# Patient Record
Sex: Male | Born: 1955 | Race: White | Hispanic: No | Marital: Married | Smoking: Never smoker
Health system: Southern US, Community
[De-identification: ages and names within clinical notes are randomized; demographics above are authoritative.]

## PROBLEM LIST (undated history)

## (undated) DIAGNOSIS — D649 Anemia, unspecified: Secondary | ICD-10-CM

## (undated) DIAGNOSIS — T7840XA Allergy, unspecified, initial encounter: Secondary | ICD-10-CM

## (undated) DIAGNOSIS — K922 Gastrointestinal hemorrhage, unspecified: Secondary | ICD-10-CM

## (undated) DIAGNOSIS — K635 Polyp of colon: Secondary | ICD-10-CM

## (undated) DIAGNOSIS — Z5189 Encounter for other specified aftercare: Secondary | ICD-10-CM

## (undated) DIAGNOSIS — E785 Hyperlipidemia, unspecified: Secondary | ICD-10-CM

## (undated) DIAGNOSIS — K5730 Diverticulosis of large intestine without perforation or abscess without bleeding: Secondary | ICD-10-CM

## (undated) DIAGNOSIS — R740 Nonspecific elevation of levels of transaminase and lactic acid dehydrogenase [LDH]: Secondary | ICD-10-CM

## (undated) DIAGNOSIS — Z87442 Personal history of urinary calculi: Secondary | ICD-10-CM

## (undated) DIAGNOSIS — R7401 Elevation of levels of liver transaminase levels: Secondary | ICD-10-CM

## (undated) DIAGNOSIS — K219 Gastro-esophageal reflux disease without esophagitis: Secondary | ICD-10-CM

## (undated) DIAGNOSIS — C4490 Unspecified malignant neoplasm of skin, unspecified: Secondary | ICD-10-CM

## (undated) HISTORY — DX: Encounter for other specified aftercare: Z51.89

## (undated) HISTORY — DX: Polyp of colon: K63.5

## (undated) HISTORY — DX: Personal history of urinary calculi: Z87.442

## (undated) HISTORY — DX: Unspecified malignant neoplasm of skin, unspecified: C44.90

## (undated) HISTORY — PX: APPENDECTOMY: SHX54

## (undated) HISTORY — PX: ELBOW ARTHROSCOPY: SUR87

## (undated) HISTORY — DX: Gastro-esophageal reflux disease without esophagitis: K21.9

## (undated) HISTORY — DX: Diverticulosis of large intestine without perforation or abscess without bleeding: K57.30

## (undated) HISTORY — PX: BACK SURGERY: SHX140

## (undated) HISTORY — DX: Elevation of levels of liver transaminase levels: R74.01

## (undated) HISTORY — DX: Anemia, unspecified: D64.9

## (undated) HISTORY — DX: Allergy, unspecified, initial encounter: T78.40XA

## (undated) HISTORY — DX: Nonspecific elevation of levels of transaminase and lactic acid dehydrogenase (ldh): R74.0

## (undated) HISTORY — PX: ASPIRATION BIOPSY: SHX1197

## (undated) HISTORY — DX: Hyperlipidemia, unspecified: E78.5

## (undated) HISTORY — DX: Gastrointestinal hemorrhage, unspecified: K92.2

---

## 2007-11-23 ENCOUNTER — Ambulatory Visit: Payer: Self-pay | Admitting: Gastroenterology

## 2007-11-30 ENCOUNTER — Ambulatory Visit: Payer: Self-pay | Admitting: Gastroenterology

## 2007-11-30 ENCOUNTER — Encounter: Payer: Self-pay | Admitting: Gastroenterology

## 2007-12-01 ENCOUNTER — Encounter: Payer: Self-pay | Admitting: Gastroenterology

## 2007-12-09 ENCOUNTER — Encounter: Payer: Self-pay | Admitting: Gastroenterology

## 2009-05-04 ENCOUNTER — Ambulatory Visit (HOSPITAL_BASED_OUTPATIENT_CLINIC_OR_DEPARTMENT_OTHER): Admission: RE | Admit: 2009-05-04 | Discharge: 2009-05-04 | Payer: Self-pay | Admitting: Orthopedic Surgery

## 2009-06-06 ENCOUNTER — Ambulatory Visit (HOSPITAL_BASED_OUTPATIENT_CLINIC_OR_DEPARTMENT_OTHER): Admission: RE | Admit: 2009-06-06 | Discharge: 2009-06-06 | Payer: Self-pay | Admitting: Internal Medicine

## 2009-06-10 ENCOUNTER — Ambulatory Visit: Payer: Self-pay | Admitting: Internal Medicine

## 2010-09-10 ENCOUNTER — Encounter (INDEPENDENT_AMBULATORY_CARE_PROVIDER_SITE_OTHER): Payer: Self-pay | Admitting: *Deleted

## 2010-09-20 NOTE — Letter (Signed)
Summary: New Patient letter  [facility]  [address] [place], [place] [address]   Phone: [phone]  Fax: [phone]       09/10/2010 MRN: [id number]  Kent Montgomery [address] [place], [place]  [address]  Dear Mr. Hulgan,  Welcome to the Gastroenterology Division at [facility].    You are scheduled to see Dr.  Jarold Motto on 10-19-10 at 8:30A.M. on the 3rd floor at [facility], [address].  We ask that you try to arrive at our office 15 minutes prior to your appointment time to allow for check-in.  We would like you to complete the enclosed self-administered evaluation form prior to your visit and bring it with you on the day of your appointment.  We will review it with you.  Also, please bring a complete list of all your medications or, if you prefer, bring the medication bottles and we will list them.  Please bring your insurance card so that we may make a copy of it.  If your insurance requires a referral to see a specialist, please bring your referral form from your primary care physician.  Co-payments are due at the time of your visit and may be paid by cash, check or credit card.     Your office visit will consist of a consult with your physician (includes a physical exam), any laboratory testing he/she may order, scheduling of any necessary diagnostic testing (e.g. x-ray, ultrasound, CT-scan), and scheduling of a procedure (e.g. Endoscopy, Colonoscopy) if required.  Please allow enough time on your schedule to allow for any/all of these possibilities.    If you cannot keep your appointment, please call [phone] to cancel or reschedule prior to your appointment date.  This allows [place] the opportunity to schedule an appointment for another patient in need of care.  If you do not cancel or reschedule by 5 p.m. the business day prior to your appointment date, you will be charged a $50.00 late cancellation/no-show fee.    Thank you for choosing  [facility] for your medical needs.  We appreciate the opportunity to care for you.  Please visit [place] at our website  to learn more about our practice.                     Sincerely,                                                             The Gastroenterology Division

## 2010-10-19 ENCOUNTER — Ambulatory Visit: Payer: Self-pay | Admitting: Gastroenterology

## 2010-10-24 ENCOUNTER — Ambulatory Visit: Payer: Self-pay | Admitting: Gastroenterology

## 2010-10-26 ENCOUNTER — Ambulatory Visit (INDEPENDENT_AMBULATORY_CARE_PROVIDER_SITE_OTHER): Payer: Commercial Managed Care - PPO | Admitting: Gastroenterology

## 2010-10-26 ENCOUNTER — Encounter: Payer: Self-pay | Admitting: Gastroenterology

## 2010-10-26 ENCOUNTER — Ambulatory Visit: Payer: Self-pay | Admitting: Physician Assistant

## 2010-10-26 VITALS — BP 100/78 | HR 64 | Ht 69.0 in | Wt 199.4 lb

## 2010-10-26 NOTE — Progress Notes (Signed)
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 .    He is referred today by Dr. Wylene Simmer for evaluation of chronic liver enzyme elevation over the last year of unexplained etiology. He was on statin medications which have been discontinued, he has used alcohol in moderation which also has been discontinued, still has mild elevation of AST and ALT. Review of his records reveal when he was a previous patient of Dr. Sharrell Ku [phone] with multiple hepatic evaluations and ultrasounds which suggested mild fatty infiltration of the liver. Then hir metabolic workup and hepatitis serologies were all negative as were other metabolic causes of liver disease and genetic causes of liver disease. The patient is fit, exercises regularly, but denies use of protein supplements or androgen compounds. . He specifically denies chronic fatigue, itching, mental status changes, ascites, or peripheral edema. Does suffer from hypercholesterolemia, and currently is not on statin medications which cause rather severe arthralgias and myalgias. Review of his labs shows normal CBC, serum albumin, PSA, HIV titer, and hepatitis serologies.  Previous colonoscopy on this patient resulted in  removal of adenomatous colon polyps. Due for followup exam in 2014. He denies abdominal pain, hepatobiliary symptoms, acid reflux or dyspepsia. He does not abuse aspirin or Tylenol,but does take when necessary Aleve. The patient underwent appendectomy in 1994 for perforated appendix. There is no history of tattoos, intravenous drug use, etc. Review of his chart does suggest that the patient's parents also suffered from elevated liver function tests of unexplained etiology. The patient does describe occasional right lower quadrant discomfort with gas and bloating but this is very transient and of little concern to the patient. He denies true systemic complaints or any specific  hepatobiliary complaints. His appetite is good and his weight is stable. He denies any specific food intolerances. Has been some recent mild elevation of serum creatinine levels. ROS: The remainder of the 10 point ROS is negative,,, recurrent cardiopulmonary, genitourinary, neurologic, orthopedic, or neuropsychiatric difficulties.  Past Medical History  Diagnosis Date  . Skin cancer     basil cell rt arm  . Colon polyp   . GERD (gastroesophageal reflux disease)   . Hyperlipidemia    Past Surgical History  Procedure Date  . Appendectomy   . Back surgery   . Elbow arthroscopy     reports that he has never smoked. He has never used smokeless tobacco. He reports that he drinks alcohol. He reports that he does not use illicit drugs. family history includes Cancer in his maternal grandfather; Colon polyps in his father; Heart disease in his father; Nephrolithiasis in his father; and Prostate cancer in his father. Allergies  Allergen Reactions  . Droperidol Anaphylaxis  . Ceftin Rash        Physical Exam: General well developed well nourished patient in no acute distress, appearing his stated age. Eyes PERRLA, no icterus, fundoscopic exam per opthamologist Skin no lesions noted Neck supple, no adenopathy, no thyroid enlargement, no tenderness Chest clear to percussion and auscultation Heart no significant murmurs, gallops or rubs noted Abdomen no hepatosplenomegaly masses or tenderness, BS normal.  Extremities no acute joint lesions, edema, phlebitis or evidence of cellulitis. Neurologic patient oriented x 3, cranial nerves intact, no focal neurologic deficits noted. Psychological mental status normal and normal affect.  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 There is certainly no evidence of chronic liver disease on physical exam . However, there has  been elevation of his serum transaminases for over 15 years, and I feel strongly that liver  biopsy is indicated at this time. We'll await further lab results from Dr. Wylene Simmer, but in the interim I have scheduled liver biopsy at [facility] ith radiographic guidance. He is to hold NSAIDs and salicylates at least 5 days before this procedure. Colonoscopy followup as per clinical protocol.  Please copy her primary care physician, referring physician, and pertinent subspecialists.  Encounter Diagnosis  Name Primary?  . Nonspecific elevation of levels of transaminase or lactic acid dehydrogenase (LDH) Yes

## 2010-10-26 NOTE — Patient Instructions (Addendum)
I have sent over an order for your Liver BX and someone from Radiology will contact you about setting up a date and time for your appt.  Your Colonoscopy is due 2014. Patients Liver bx is scheduled for 11/06/2010

## 2010-10-31 ENCOUNTER — Encounter: Payer: Self-pay | Admitting: Gastroenterology

## 2010-11-06 ENCOUNTER — Other Ambulatory Visit (HOSPITAL_COMMUNITY): Payer: Commercial Managed Care - PPO

## 2010-11-09 ENCOUNTER — Other Ambulatory Visit (HOSPITAL_COMMUNITY): Payer: Commercial Managed Care - PPO

## 2010-11-14 ENCOUNTER — Other Ambulatory Visit: Payer: Self-pay | Admitting: Gastroenterology

## 2010-11-14 ENCOUNTER — Ambulatory Visit (HOSPITAL_COMMUNITY)
Admission: RE | Admit: 2010-11-14 | Discharge: 2010-11-14 | Disposition: A | Discharge status: Home / Self Care | Payer: 59 | Source: Ambulatory Visit | Attending: Physician Assistant | Admitting: Physician Assistant

## 2010-11-14 ENCOUNTER — Other Ambulatory Visit: Payer: Self-pay | Admitting: Interventional Radiology

## 2010-11-14 ENCOUNTER — Ambulatory Visit (HOSPITAL_COMMUNITY): Payer: 59

## 2010-11-14 DIAGNOSIS — R945 Abnormal results of liver function studies: Secondary | ICD-10-CM | POA: Insufficient documentation

## 2010-11-14 DIAGNOSIS — Z01812 Encounter for preprocedural laboratory examination: Secondary | ICD-10-CM | POA: Insufficient documentation

## 2010-11-14 LAB — CBC
HCT: 45.7 % (ref 39.0–52.0)
Platelets: 198 10*3/uL (ref 150–400)
RBC: 5.14 MIL/uL (ref 4.22–5.81)
RDW: 12.5 % (ref 11.5–15.5)
WBC: 5.1 10*3/uL (ref 4.0–10.5)

## 2010-11-14 LAB — PROTIME-INR: INR: 0.95 (ref 0.00–1.49)

## 2010-11-14 LAB — APTT: aPTT: 26 seconds (ref 24–37)

## 2010-11-15 ENCOUNTER — Telehealth: Payer: Self-pay | Admitting: Gastroenterology

## 2010-11-15 NOTE — Telephone Encounter (Signed)
Notified pt it may take up to 7 days for pathology results; pt verbalized understanding.

## 2010-11-21 ENCOUNTER — Encounter: Payer: Self-pay | Admitting: Gastroenterology

## 2010-11-23 ENCOUNTER — Telehealth: Payer: Self-pay | Admitting: Gastroenterology

## 2010-11-23 NOTE — Telephone Encounter (Signed)
lmom stating Dr Jarold Motto reviewed the biopsy results and his liver is fine. If he would like to discuss the results, he may call back and schedule an appt. I will be happy to fax him the results if he would like.

## 2010-11-28 ENCOUNTER — Telehealth: Payer: Self-pay | Admitting: *Deleted

## 2010-11-28 NOTE — Telephone Encounter (Signed)
Pt lmom requesting we fax him the results of his liver bx to confidential fax # [phone]-faxed and notified pt.

## 2010-11-30 ENCOUNTER — Encounter: Payer: Self-pay | Admitting: Gastroenterology

## 2010-11-30 ENCOUNTER — Ambulatory Visit (INDEPENDENT_AMBULATORY_CARE_PROVIDER_SITE_OTHER): Payer: Commercial Managed Care - PPO | Admitting: Gastroenterology

## 2010-11-30 VITALS — BP 128/72 | HR 70 | Ht 69.0 in | Wt 206.0 lb

## 2010-11-30 DIAGNOSIS — R7401 Elevation of levels of liver transaminase levels: Secondary | ICD-10-CM

## 2010-11-30 DIAGNOSIS — R748 Abnormal levels of other serum enzymes: Secondary | ICD-10-CM | POA: Insufficient documentation

## 2010-11-30 NOTE — Progress Notes (Signed)
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 He recently underwent percutaneous ultrasound-guided liver biopsy without difficulty. Biopsy review is essentially unremarkable without any evidence of chronic hepatitis, hemachromatosis, alpha-1 anti-trypsin deficiency, or cirrhosis. Currently is asymptomatic and denies symptoms of chronic liver disease or mental status changes. He uses ethanol socially and denies abuse. He is not all oral steroids or other over-the-counter agents. Family history is noncontributory.  Current Medications, Allergies, Past Medical History, Past Surgical History, Family History and Social History were reviewed in [employer] record.  Pertinent Review of Systems Negative   Physical Exam: No stigmata of chronic liver disease noted. Mental status is normal. I cannot appreciate hepatosplenomegaly, abdominal masses or tenderness.    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 I have asked him to continue yearly checkups with his primary care physician and liver enzymes yearly. I see no need for further hepatic or GI workup at this point. I tried to reassure the patient that the chance of him developing chronic liver disease are very remote. I have urged alcohol moderation, healthy dietary habits, and standard medical followup protocols.  Please copy her primary care physician, referring physician, and pertinent subspecialists.

## 2012-10-19 ENCOUNTER — Encounter: Payer: Self-pay | Admitting: Gastroenterology

## 2013-06-24 HISTORY — PX: COLONOSCOPY: SHX174

## 2013-07-21 ENCOUNTER — Encounter: Payer: Self-pay | Admitting: Gastroenterology

## 2013-08-24 ENCOUNTER — Encounter: Payer: Self-pay | Admitting: Internal Medicine

## 2013-10-04 ENCOUNTER — Ambulatory Visit (AMBULATORY_SURGERY_CENTER): Payer: Self-pay | Admitting: *Deleted

## 2013-10-04 VITALS — Ht 70.0 in | Wt 225.8 lb

## 2013-10-04 DIAGNOSIS — Z86010 Personal history of colonic polyps: Secondary | ICD-10-CM

## 2013-10-04 MED ORDER — MOVIPREP 100 G PO SOLR: 1.0000 | Freq: Once | ORAL | Status: DC

## 2013-10-04 NOTE — Progress Notes (Signed)
No egg or soy allergy  No home oxygen use or problems with anesthesia

## 2013-10-11 ENCOUNTER — Ambulatory Visit (AMBULATORY_SURGERY_CENTER): Payer: 59 | Admitting: Internal Medicine

## 2013-10-11 ENCOUNTER — Encounter: Payer: Self-pay | Admitting: Internal Medicine

## 2013-10-11 VITALS — BP 108/66 | HR 80 | Temp 98.8°F | Resp 22 | Ht 70.0 in | Wt 225.0 lb

## 2013-10-11 DIAGNOSIS — Z86010 Personal history of colonic polyps: Secondary | ICD-10-CM

## 2013-10-11 DIAGNOSIS — D126 Benign neoplasm of colon, unspecified: Secondary | ICD-10-CM

## 2013-10-11 DIAGNOSIS — Z1211 Encounter for screening for malignant neoplasm of colon: Secondary | ICD-10-CM

## 2013-10-11 MED ORDER — SODIUM CHLORIDE 0.9 % IV SOLN: 500.0000 mL | INTRAVENOUS | Status: DC

## 2013-10-11 NOTE — Progress Notes (Signed)
Report to pacu rn, vss, bbs=clear 

## 2013-10-11 NOTE — Patient Instructions (Signed)
YOU HAD AN ENDOSCOPIC PROCEDURE TODAY AT THE [facility]: Refer to the procedure report that was given to you for any specific questions about what was found during the examination.  If the procedure report does not answer your questions, please call your gastroenterologist to clarify.  If you requested that your care partner not be given the details of your procedure findings, then the procedure report has been included in a sealed envelope for you to review at your convenience later.  YOU SHOULD EXPECT: Some feelings of bloating in the abdomen. Passage of more gas than usual.  Walking can help get rid of the air that was put into your GI tract during the procedure and reduce the bloating. If you had a lower endoscopy (such as a colonoscopy or flexible sigmoidoscopy) you may notice spotting of blood in your stool or on the toilet paper. If you underwent a bowel prep for your procedure, then you may not have a normal bowel movement for a few days.  DIET: Your first meal following the procedure should be a light meal and then it is ok to progress to your normal diet.  A half-sandwich or bowl of soup is an example of a good first meal.  Heavy or fried foods are harder to digest and may make you feel nauseous or bloated.  Likewise meals heavy in dairy and vegetables can cause extra gas to form and this can also increase the bloating.  Drink plenty of fluids but you should avoid alcoholic beverages for 24 hours.  ACTIVITY: Your care partner should take you home directly after the procedure.  You should plan to take it easy, moving slowly for the rest of the day.  You can resume normal activity the day after the procedure however you should NOT DRIVE or use heavy machinery for 24 hours (because of the sedation medicines used during the test).    SYMPTOMS TO REPORT IMMEDIATELY: A gastroenterologist can be reached at any hour.  During normal business hours, 8:30 AM to 5:00 PM Monday through Friday,  call [phone].  After hours and on weekends, please call the GI answering service at [phone] who will take a message and have the physician on call contact you.   Following lower endoscopy (colonoscopy or flexible sigmoidoscopy):  Excessive amounts of blood in the stool  Significant tenderness or worsening of abdominal pains  Swelling of the abdomen that is new, acute  Fever of 100F or higher  FOLLOW UP: If any biopsies were taken you will be contacted by phone or by letter within the next 1-3 weeks.  Call your gastroenterologist if you have not heard about the biopsies in 3 weeks.  Our staff will call the home number listed on your records the next business day following your procedure to check on you and address any questions or concerns that you may have at that time regarding the information given to you following your procedure. This is a courtesy call and so if there is no answer at the home number and we have not heard from you through the emergency physician on call, we will assume that you have returned to your regular daily activities without incident.  SIGNATURES/CONFIDENTIALITY: You and/or your care partner have signed paperwork which will be entered into your electronic medical record.  These signatures attest to the fact that that the information above on your After Visit Summary has been reviewed and is understood.  Full responsibility of the confidentiality of this   discharge information lies with you and/or your care-partner.  Polyp, diverticulosis, high fiber diet information given.  Dr. Hilarie Fredrickson will send you a letter after May 2, to let you hear from pathology reports.    Avoid aspirin, aspirin products, and anti-inflammatories for one week.

## 2013-10-11 NOTE — Progress Notes (Signed)
Called to room to assist during endoscopic procedure.  Patient ID and intended procedure confirmed with present staff. Received instructions for my participation in the procedure from the performing physician.  

## 2013-10-11 NOTE — Op Note (Signed)
[facility]  [address]. [place], [address]   COLONOSCOPY PROCEDURE REPORT  PATIENT: Kent Montgomery, Kent Montgomery  MR#: [id number] BIRTHDATE: Nov 13, 1955 , 84  yrs. old GENDER: Male ENDOSCOPIST: Jerene Bears, MD PROCEDURE DATE:  10/11/2013 PROCEDURE:   Colonoscopy with snare polypectomy and Colonoscopy with cold biopsy polypectomy First Screening Colonoscopy - Avg.  risk and is 50 yrs.  old or older - No.  Prior Negative Screening - Now for repeat screening. N/A  History of Adenoma - Now for follow-up colonoscopy & has been > or = to 3 yrs.  Yes hx of adenoma.  Has been 3 or more years since last colonoscopy.  Polyps Removed Today? Yes. ASA CLASS:   Class II INDICATIONS:Patient's personal history of colon polyps and elevated risk screening. MEDICATIONS: MAC sedation, administered by CRNA and propofol (Diprivan) 350mg  IV  DESCRIPTION OF PROCEDURE:   After the risks benefits and alternatives of the procedure were thoroughly explained, informed consent was obtained.  A digital rectal exam revealed no rectal mass.   The LB [id number]  endoscope was introduced through the anus and advanced to the cecum, which was identified by both the appendix and ileocecal valve. No adverse events experienced. The quality of the prep was good, using MoviPrep  The instrument was then slowly withdrawn as the colon was fully examined.   COLON FINDINGS: Two sessile polyps measuring 5 mm in size were found in the ascending colon and at the hepatic flexure.  Polypectomy was performed using cold snare.  All resections were complete and all polyp tissue was completely retrieved.   A flat polyp measuring 8 mm in size was found in the transverse colon.  A polypectomy was performed with a cold snare.  The resection was complete and the polyp tissue was completely retrieved.   Two sessile polyps ranging between 3-71mm in size were found in the transverse colon and descending colon.  Polypectomy  was performed with cold forceps. All resections were complete and all polyp tissue was completely retrieved.   Mild diverticulosis was noted in the sigmoid colon. Retroflexed views revealed internal hemorrhoids. The time to cecum=1 minutes 23 seconds.  Withdrawal time=15 minutes 44 seconds. The scope was withdrawn and the procedure completed.  COMPLICATIONS: There were no complications.  ENDOSCOPIC IMPRESSION: 1.   Two sessile polyps measuring 5 mm in size were found in the ascending colon and at the hepatic flexure; Polypectomy was performed using cold snare 2.   Flat polyp measuring 8 mm in size was found in the transverse colon; polypectomy was performed with a cold snare 3.   Two sessile polyps ranging between 3-67mm in size were found in the transverse colon and descending colon; Polypectomy was performed with cold forceps 4.   Mild diverticulosis was noted in the sigmoid colon  RECOMMENDATIONS: 1.  Hold aspirin, aspirin products, and anti-inflammatory medication for 1 week. 2.  Await pathology results 3.  High fiber diet 4.  Timing of repeat colonoscopy will be determined by pathology findings. 5.  You will receive a letter within 1-2 weeks with the results of your biopsy as well as final recommendations.  Please call my office if you have not received a letter after 3 weeks.   eSigned:  Jerene Bears, MD 10/11/2013 2:11 PM   cc: The Patient and Domenick Gong, MD   PATIENT NAME:  Kent Montgomery, Kent Montgomery MR#: [id number]

## 2013-10-12 ENCOUNTER — Telehealth: Payer: Self-pay | Admitting: *Deleted

## 2013-10-12 NOTE — Telephone Encounter (Signed)
  Follow up Call-  Call back number 10/11/2013  Post procedure Call Back phone  # [phone]  Permission to leave phone message Yes     Patient questions:  Message left to call us if necessary.

## 2013-10-25 ENCOUNTER — Encounter: Payer: Self-pay | Admitting: Internal Medicine

## 2013-11-22 ENCOUNTER — Other Ambulatory Visit (HOSPITAL_COMMUNITY): Payer: Self-pay | Admitting: Neurosurgery

## 2013-11-22 DIAGNOSIS — M545 Low back pain, unspecified: Secondary | ICD-10-CM

## 2013-11-29 ENCOUNTER — Ambulatory Visit (HOSPITAL_COMMUNITY)
Admission: RE | Admit: 2013-11-29 | Discharge: 2013-11-29 | Disposition: A | Discharge status: Home / Self Care | Payer: 59 | Source: Ambulatory Visit | Attending: Neurosurgery | Admitting: Neurosurgery

## 2013-11-29 DIAGNOSIS — R29898 Other symptoms and signs involving the musculoskeletal system: Secondary | ICD-10-CM | POA: Insufficient documentation

## 2013-11-29 DIAGNOSIS — G8929 Other chronic pain: Secondary | ICD-10-CM | POA: Insufficient documentation

## 2013-11-29 DIAGNOSIS — M545 Low back pain, unspecified: Secondary | ICD-10-CM | POA: Insufficient documentation

## 2013-11-29 MED ORDER — GADOBENATE DIMEGLUMINE 529 MG/ML IV SOLN
20.0000 mL | Freq: Once | INTRAVENOUS | Status: AC | PRN
  Administered 2013-11-29: 20 mL via INTRAVENOUS

## 2014-11-24 ENCOUNTER — Encounter: Payer: Self-pay | Admitting: Gastroenterology

## 2015-12-09 ENCOUNTER — Encounter: Payer: Self-pay | Admitting: Emergency Medicine

## 2015-12-09 ENCOUNTER — Emergency Department
Admission: EM | Admit: 2015-12-09 | Discharge: 2015-12-09 | Disposition: A | Discharge status: Home / Self Care | Payer: 59 | Source: Home / Self Care | Attending: Family Medicine | Admitting: Family Medicine

## 2015-12-09 ENCOUNTER — Emergency Department (INDEPENDENT_AMBULATORY_CARE_PROVIDER_SITE_OTHER): Payer: 59

## 2015-12-09 DIAGNOSIS — S6991XA Unspecified injury of right wrist, hand and finger(s), initial encounter: Secondary | ICD-10-CM

## 2015-12-09 DIAGNOSIS — M25541 Pain in joints of right hand: Secondary | ICD-10-CM | POA: Diagnosis not present

## 2015-12-09 DIAGNOSIS — M79641 Pain in right hand: Secondary | ICD-10-CM | POA: Diagnosis not present

## 2015-12-09 DIAGNOSIS — M20011 Mallet finger of right finger(s): Secondary | ICD-10-CM

## 2015-12-09 DIAGNOSIS — IMO0001 Reserved for inherently not codable concepts without codable children: Secondary | ICD-10-CM

## 2015-12-09 NOTE — ED Notes (Signed)
Patient presents with a possible dislocation of the right ring finger. Injury today at approximately 11:30am. Deformity noted. Rates pain 1-2/10

## 2015-12-09 NOTE — ED Provider Notes (Signed)
CSN: [id number]     Arrival date & time 12/09/15  1239 History   First MD Initiated Contact with Patient 12/09/15 1303     Chief Complaint  Patient presents with  . Dislocation   (Consider location/radiation/quality/duration/timing/severity/associated sxs/prior Treatment) HPI  Kent Montgomery is a 60 y.o. male presenting to UC with c/o sudden onset Right ring finger pain and deformity that started around 11:30AM this morning.  Pt notes he was wiping down a mat at the gym when his finger got caught on the mat while the rest of his hand kept going.  Pain is minimal at this time, 1-2/10 but he is unable to fully straighten his finger.  He took ibuprofen PTA, moderate relief.  Denies any other injuries.    Past Medical History  Diagnosis Date  . Skin cancer     basil cell rt arm  . Colon polyp   . GERD (gastroesophageal reflux disease)   . Hyperlipidemia   . Allergy    Past Surgical History  Procedure Laterality Date  . Appendectomy    . Back surgery    . Elbow arthroscopy    . Colonoscopy     Family History  Problem Relation Age of Onset  . Cancer Maternal Grandfather     bladder  . Prostate cancer Father   . Colon polyps Father   . Heart disease Father   . Nephrolithiasis Father   . Stomach cancer Father   . Colon cancer Neg Hx   . Esophageal cancer Neg Hx   . Rectal cancer Neg Hx    Social History  Substance Use Topics  . Smoking status: Never Smoker   . Smokeless tobacco: Never Used  . Alcohol Use: 6.0 oz/week    10 Glasses of wine per week     Comment: Wine    Review of Systems  Musculoskeletal: Positive for myalgias, joint swelling and arthralgias.  Skin: Negative for color change and wound.  Neurological: Positive for weakness. Negative for numbness.    Allergies  Droperidol and Cefuroxime axetil  Home Medications   Prior to Admission medications   Medication Sig Start Date End Date Taking? Authorizing Provider  aspirin 81 MG tablet Take 81 mg by  mouth daily.    Historical Provider, MD  fish oil-omega-3 fatty acids 1000 MG capsule Take 2 g by mouth daily.      Historical Provider, MD  fluticasone (FLONASE) 50 MCG/ACT nasal spray Place into both nostrils daily.    Historical Provider, MD  ibuprofen (ADVIL,MOTRIN) 200 MG tablet Take 400 mg by mouth 2 (two) times daily as needed.      Historical Provider, MD  loratadine (CLARITIN) 10 MG tablet Take 10 mg by mouth daily.    Historical Provider, MD  Multiple Vitamin (MULTIVITAMIN PO) Take 1 capsule by mouth daily.      Historical Provider, MD  Psyllium (METAMUCIL PO) Take 1 capsule by mouth daily.      Historical Provider, MD   Meds Ordered and Administered this Visit  Medications - No data to display  BP 105/74 mmHg  Pulse 96  Temp(Src) 98.1 F (36.7 C) (Oral)  Resp 16  Ht 5\' 10"  (1.778 m)  Wt 210 lb (95.255 kg)  BMI 30.13 kg/m2  SpO2 98% No data found.   Physical Exam  Constitutional: He is oriented to person, place, and time. He appears well-developed and well-nourished.  HENT:  Head: Normocephalic and atraumatic.  Eyes: EOM are normal.  Neck: Normal range  of motion.  Cardiovascular: Normal rate.   Right ring finger: cap refill < 3 seconds  Pulmonary/Chest: Effort normal.  Musculoskeletal: He exhibits edema and tenderness.  Right ring finger, mild edema, mallet finger deformity. Full Flexion, limited extension at distal aspect of ring finger. Mild tenderness.   Neurological: He is alert and oriented to person, place, and time.  Right ring finger: normal sensation to light touch.  Skin: Skin is warm and dry.  Skin in tact. No ecchymosis or erythema  Psychiatric: He has a normal mood and affect. His behavior is normal.  Nursing note and vitals reviewed.   ED Course  Procedures (including critical care time)  Labs Review Labs Reviewed - No data to display  Imaging Review Dg Hand Complete Right  12/09/2015  CLINICAL DATA:  Pain within the fourth digit. EXAM: RIGHT  HAND - COMPLETE 3+ VIEW COMPARISON:  None. FINDINGS: There is no evidence of fracture or dislocation. There is no evidence of arthropathy or other focal bone abnormality. Soft tissues are unremarkable. IMPRESSION: Negative. Electronically Signed   By: Fidela Salisbury M.D.   On: 12/09/2015 13:44     MDM   1. Mallet deformity of fourth finger, right   2. Injury of right ring finger, initial encounter    Mallet finger deformity of Right ring finger w/o evidence of dislocation or fracture. Sensation and circulation in tact. Flexion in tact.   Pt placed in stack splint. Encouraged to f/u with Sports Medicine for ongoing care of injury. Pt info packet provided. Patient verbalized understanding and agreement with treatment plan.    Noland Fordyce, PA-C 12/09/15 1622

## 2016-01-26 ENCOUNTER — Inpatient Hospital Stay (HOSPITAL_BASED_OUTPATIENT_CLINIC_OR_DEPARTMENT_OTHER)
Admission: EM | Admit: 2016-01-26 | Discharge: 2016-01-30 | DRG: 378 | Disposition: A | Discharge status: Home / Self Care | Payer: 59 | Attending: Internal Medicine | Admitting: Internal Medicine

## 2016-01-26 ENCOUNTER — Encounter (HOSPITAL_BASED_OUTPATIENT_CLINIC_OR_DEPARTMENT_OTHER): Payer: Self-pay | Admitting: *Deleted

## 2016-01-26 DIAGNOSIS — K922 Gastrointestinal hemorrhage, unspecified: Secondary | ICD-10-CM | POA: Diagnosis present

## 2016-01-26 DIAGNOSIS — D509 Iron deficiency anemia, unspecified: Secondary | ICD-10-CM | POA: Diagnosis not present

## 2016-01-26 DIAGNOSIS — Z85828 Personal history of other malignant neoplasm of skin: Secondary | ICD-10-CM | POA: Diagnosis not present

## 2016-01-26 DIAGNOSIS — E785 Hyperlipidemia, unspecified: Secondary | ICD-10-CM | POA: Diagnosis present

## 2016-01-26 DIAGNOSIS — K760 Fatty (change of) liver, not elsewhere classified: Secondary | ICD-10-CM | POA: Diagnosis present

## 2016-01-26 DIAGNOSIS — K5730 Diverticulosis of large intestine without perforation or abscess without bleeding: Secondary | ICD-10-CM | POA: Diagnosis present

## 2016-01-26 DIAGNOSIS — K921 Melena: Secondary | ICD-10-CM | POA: Diagnosis present

## 2016-01-26 DIAGNOSIS — D696 Thrombocytopenia, unspecified: Secondary | ICD-10-CM | POA: Diagnosis present

## 2016-01-26 DIAGNOSIS — Z79899 Other long term (current) drug therapy: Secondary | ICD-10-CM

## 2016-01-26 DIAGNOSIS — Z7982 Long term (current) use of aspirin: Secondary | ICD-10-CM | POA: Diagnosis not present

## 2016-01-26 DIAGNOSIS — Z860100 Personal history of colon polyps, unspecified: Secondary | ICD-10-CM

## 2016-01-26 DIAGNOSIS — J309 Allergic rhinitis, unspecified: Secondary | ICD-10-CM | POA: Diagnosis present

## 2016-01-26 DIAGNOSIS — A09 Infectious gastroenteritis and colitis, unspecified: Secondary | ICD-10-CM

## 2016-01-26 DIAGNOSIS — A040 Enteropathogenic Escherichia coli infection: Secondary | ICD-10-CM

## 2016-01-26 DIAGNOSIS — K5521 Angiodysplasia of colon with hemorrhage: Secondary | ICD-10-CM | POA: Diagnosis not present

## 2016-01-26 DIAGNOSIS — K648 Other hemorrhoids: Secondary | ICD-10-CM | POA: Diagnosis present

## 2016-01-26 DIAGNOSIS — D62 Acute posthemorrhagic anemia: Secondary | ICD-10-CM | POA: Diagnosis present

## 2016-01-26 DIAGNOSIS — D649 Anemia, unspecified: Secondary | ICD-10-CM | POA: Diagnosis present

## 2016-01-26 DIAGNOSIS — K219 Gastro-esophageal reflux disease without esophagitis: Secondary | ICD-10-CM | POA: Diagnosis present

## 2016-01-26 DIAGNOSIS — B9620 Unspecified Escherichia coli [E. coli] as the cause of diseases classified elsewhere: Secondary | ICD-10-CM | POA: Diagnosis present

## 2016-01-26 DIAGNOSIS — Z8719 Personal history of other diseases of the digestive system: Secondary | ICD-10-CM | POA: Diagnosis present

## 2016-01-26 DIAGNOSIS — I959 Hypotension, unspecified: Secondary | ICD-10-CM | POA: Diagnosis present

## 2016-01-26 DIAGNOSIS — K274 Chronic or unspecified peptic ulcer, site unspecified, with hemorrhage: Secondary | ICD-10-CM | POA: Diagnosis not present

## 2016-01-26 DIAGNOSIS — E538 Deficiency of other specified B group vitamins: Secondary | ICD-10-CM | POA: Diagnosis present

## 2016-01-26 DIAGNOSIS — R197 Diarrhea, unspecified: Secondary | ICD-10-CM | POA: Diagnosis present

## 2016-01-26 LAB — CBC WITH DIFFERENTIAL/PLATELET
BASOS ABS: 0 10*3/uL (ref 0.0–0.1)
BASOS PCT: 0 %
EOS ABS: 0.1 10*3/uL (ref 0.0–0.7)
EOS PCT: 1 %
HCT: 27 % — ABNORMAL LOW (ref 39.0–52.0)
HEMOGLOBIN: 9.2 g/dL — AB (ref 13.0–17.0)
Lymphocytes Relative: 30 %
Lymphs Abs: 2.2 10*3/uL (ref 0.7–4.0)
MCH: 31.3 pg (ref 26.0–34.0)
MCHC: 34.1 g/dL (ref 30.0–36.0)
MCV: 91.8 fL (ref 78.0–100.0)
MONO ABS: 0.5 10*3/uL (ref 0.1–1.0)
Monocytes Relative: 7 %
Neutro Abs: 4.4 10*3/uL (ref 1.7–7.7)
Neutrophils Relative %: 62 %
Platelets: 175 10*3/uL (ref 150–400)
RBC: 2.94 MIL/uL — AB (ref 4.22–5.81)
RDW: 12.5 % (ref 11.5–15.5)
WBC: 7.2 10*3/uL (ref 4.0–10.5)

## 2016-01-26 LAB — COMPREHENSIVE METABOLIC PANEL
ALBUMIN: 3.6 g/dL (ref 3.5–5.0)
ALK PHOS: 58 U/L (ref 38–126)
ALT: 36 U/L (ref 17–63)
AST: 19 U/L (ref 15–41)
Anion gap: 5 (ref 5–15)
BUN: 25 mg/dL — ABNORMAL HIGH (ref 6–20)
CALCIUM: 8 mg/dL — AB (ref 8.9–10.3)
CHLORIDE: 107 mmol/L (ref 101–111)
CO2: 24 mmol/L (ref 22–32)
CREATININE: 1.02 mg/dL (ref 0.61–1.24)
GFR calc Af Amer: >60 mL/min (ref >60)
GFR calc non Af Amer: >60 mL/min (ref >60)
GLUCOSE: 110 mg/dL — AB (ref 65–99)
Potassium: 3.7 mmol/L (ref 3.5–5.1)
SODIUM: 136 mmol/L (ref 135–145)
Total Bilirubin: 0.5 mg/dL (ref 0.3–1.2)
Total Protein: 5.6 g/dL — ABNORMAL LOW (ref 6.5–8.1)

## 2016-01-26 LAB — ABO/RH: ABO/RH(D): O POS

## 2016-01-26 LAB — RETICULOCYTES
RBC.: 2.57 MIL/uL — ABNORMAL LOW (ref 4.22–5.81)
RETIC CT PCT: 2.3 % (ref 0.4–3.1)
Retic Count, Absolute: 59.1 10*3/uL (ref 19.0–186.0)

## 2016-01-26 LAB — HEMOGLOBIN: HEMOGLOBIN: 7.9 g/dL — AB (ref 13.0–17.0)

## 2016-01-26 LAB — PREPARE RBC (CROSSMATCH)

## 2016-01-26 LAB — PROTIME-INR
INR: 1.28
PROTHROMBIN TIME: 16.1 s — AB (ref 11.4–15.2)

## 2016-01-26 LAB — OCCULT BLOOD X 1 CARD TO LAB, STOOL: Fecal Occult Bld: POSITIVE — AB

## 2016-01-26 LAB — HEMATOCRIT: HEMATOCRIT: 23.5 % — AB (ref 39.0–52.0)

## 2016-01-26 LAB — APTT: APTT: 25 s (ref 24–36)

## 2016-01-26 MED ORDER — ONDANSETRON HCL 4 MG PO TABS: 4.0000 mg | ORAL_TABLET | Freq: Four times a day (QID) | ORAL | Status: DC | PRN

## 2016-01-26 MED ORDER — SODIUM CHLORIDE 0.9 % IV BOLUS (SEPSIS)
1000.0000 mL | Freq: Once | INTRAVENOUS | Status: AC
  Administered 2016-01-26: 1000 mL via INTRAVENOUS

## 2016-01-26 MED ORDER — HYDROCODONE-ACETAMINOPHEN 5-325 MG PO TABS
1.0000 | ORAL_TABLET | ORAL | Status: DC | PRN
  Filled 2016-01-26: qty 2

## 2016-01-26 MED ORDER — FLUTICASONE PROPIONATE 50 MCG/ACT NA SUSP
2.0000 | Freq: Every day | NASAL | Status: DC
  Filled 2016-01-26: qty 16

## 2016-01-26 MED ORDER — ACETAMINOPHEN 650 MG RE SUPP
650.0000 mg | Freq: Four times a day (QID) | RECTAL | Status: DC | PRN
Start: 2016-01-26 — End: 2016-01-31

## 2016-01-26 MED ORDER — ONDANSETRON HCL 4 MG/2ML IJ SOLN
4.0000 mg | Freq: Four times a day (QID) | INTRAMUSCULAR | Status: DC | PRN
  Filled 2016-01-26: qty 2

## 2016-01-26 MED ORDER — ACETAMINOPHEN 325 MG PO TABS
650.0000 mg | ORAL_TABLET | Freq: Four times a day (QID) | ORAL | Status: DC | PRN
  Administered 2016-01-26 – 2016-01-29 (×5): 650 mg via ORAL
  Filled 2016-01-26 (×6): qty 2

## 2016-01-26 MED ORDER — SODIUM CHLORIDE 0.9 % IV SOLN
INTRAVENOUS | Status: DC
Start: 2016-01-26 — End: 2016-01-27
  Administered 2016-01-26: via INTRAVENOUS

## 2016-01-26 MED ORDER — SODIUM CHLORIDE 0.9 % IV SOLN: Freq: Once | INTRAVENOUS | Status: DC

## 2016-01-26 MED ORDER — PANTOPRAZOLE SODIUM 40 MG IV SOLR
40.0000 mg | Freq: Two times a day (BID) | INTRAVENOUS | Status: DC
  Administered 2016-01-26 – 2016-01-28 (×4): 40 mg via INTRAVENOUS
  Filled 2016-01-26 (×4): qty 40

## 2016-01-26 MED ORDER — OMEGA-3-ACID ETHYL ESTERS 1 G PO CAPS
2.0000 g | ORAL_CAPSULE | Freq: Every day | ORAL | Status: DC
  Administered 2016-01-30: 2 g via ORAL
  Filled 2016-01-26 (×2): qty 2

## 2016-01-26 MED ORDER — SODIUM CHLORIDE 0.9% FLUSH: 3.0000 mL | Freq: Two times a day (BID) | INTRAVENOUS | Status: DC

## 2016-01-26 MED ORDER — LORATADINE 10 MG PO TABS
10.0000 mg | ORAL_TABLET | Freq: Every day | ORAL | Status: DC
  Filled 2016-01-26 (×2): qty 1

## 2016-01-26 NOTE — H&P (Signed)
History and Physical    HOYET VEALE [id number] DOB: 1955/08/02 DOA: 01/26/2016  PCP: Haywood Pao, MD   Patient coming from: Home   Chief Complaint: Bloody diarrhea  HPI: Kent Montgomery is a generally-healthy 60 y.o. male with medical history significant for hyperlipidemia, allergic rhinitis, colonic polyps, and sigmoid diverticulosis who presents to the emergency department with bloody diarrhea of 2 days' duration. Patient reports pain in his usual state of health, having returned from a trip to [place] 12 days ago, in until the development of bloody diarrhea yesterday. Patient denies any fevers, chills, abdominal pain, or nausea. He reports approximately 10 episodes of diarrhea yesterday with bright red blood mixed with stool. He had approximately 3 episodes today. Patient denies any chest pain associated with this, but notes that he exercises at the gym every day, but yesterday was only able to work out for 1 or 2 minutes before becoming extremely fatigued. His wife, who is also in [place], is currently experiencing diarrhea as well, but there is been no blood observed in her stool and she has not been febrile either. He has noted his hands to be pale today. Patient reports feeling well while at rest, and quickly fatigued with activity, but otherwise denies symptoms. He endorses an approximate one drink per day of alcohol use, uses NSAIDs as needed approximately 3 times a week, and takes a daily aspirin 81 mg. He does not take any acid-reducing agents and has never had similar symptoms previously. He has had 2 colonoscopies, most recently in April 2015, notable for polyps and mild sigmoid diverticulosis.  [facility] ED Course: Upon arrival to the [facility] ED, patient is found to be afebrile, saturating well on room air, tachycardic in the 1 teens, and with blood pressure 99/80. Chemistry panel was notable for a mildly increased BUN to creatinine ratio and CBC features a hemoglobin of 9.2, down  from 15.9 back in 2012, the last data point we have in the EMR. DRE reveals maroon stool which is FOBT positive. Patient was given a 1 L normal saline bolus and stool sample was sent for ova and parasite evaluation. Dr. Havery Moros of GI was consulted by the ED physician and advised admission to [facility] for further evaluation and management. Patient's blood pressure improved and her tachycardia resolved after the fluid bolus. He was transferred to [facility] for admission and arrives in stable condition.  Patient is interviewed and examined on the telemetry unit at [facility] where he continues to be afebrile and with vital signs stable. There is been no bleeding observed since his arrival. He will be admitted for ongoing evaluation and management of symptomatic anemia from acute GI bleed.  Review of Systems:  All other systems reviewed and apart from HPI, are negative.  Past Medical History:  Diagnosis Date  . Allergy   . Colon polyp   . GERD (gastroesophageal reflux disease)   . Hyperlipidemia   . Skin cancer    basil cell rt arm    Past Surgical History:  Procedure Laterality Date  . APPENDECTOMY    . BACK SURGERY    . COLONOSCOPY    . ELBOW ARTHROSCOPY       reports that he has never smoked. He has never used smokeless tobacco. He reports that he drinks about 6.0 oz of alcohol per week . He reports that he does not use drugs.  Allergies  Allergen Reactions  . Droperidol Anaphylaxis  . Cefuroxime Axetil  Rash    Family History  Problem Relation Age of Onset  . Cancer Maternal Grandfather     bladder  . Prostate cancer Father   . Colon polyps Father   . Heart disease Father   . Nephrolithiasis Father   . Stomach cancer Father   . Colon cancer Neg Hx   . Esophageal cancer Neg Hx   . Rectal cancer Neg Hx      Prior to Admission medications   Medication Sig Start Date End Date Taking? Authorizing Provider  aspirin 81 MG tablet Take 81 mg by  mouth daily.    Historical Provider, MD  fish oil-omega-3 fatty acids 1000 MG capsule Take 2 g by mouth daily.      Historical Provider, MD  fluticasone (FLONASE) 50 MCG/ACT nasal spray Place into both nostrils daily.    Historical Provider, MD  ibuprofen (ADVIL,MOTRIN) 200 MG tablet Take 400 mg by mouth 2 (two) times daily as needed.      Historical Provider, MD  loratadine (CLARITIN) 10 MG tablet Take 10 mg by mouth daily.    Historical Provider, MD  Multiple Vitamin (MULTIVITAMIN PO) Take 1 capsule by mouth daily.      Historical Provider, MD  Psyllium (METAMUCIL PO) Take 1 capsule by mouth daily.      Historical Provider, MD    Physical Exam: Vitals:   01/26/16 1930 01/26/16 1938 01/26/16 1947 01/26/16 2113  BP: 108/64  113/64 (!) 121/59  Pulse: 86  89 90  Resp:  16 16 18   Temp:   99 F (37.2 C) 98.9 F (37.2 C)  TempSrc:    Oral  SpO2: 100%  100% 95%  Weight:    94.8 kg (208 lb 15.9 oz)  Height:          Constitutional: NAD, calm, comfortable Eyes: PERTLA, lids normal. Conjunctival pallor ENMT: Mucous membranes are moist. Posterior pharynx clear of any exudate or lesions.   Neck: normal, supple, no masses, no thyromegaly Respiratory: clear to auscultation bilaterally, no wheezing, no crackles. Normal respiratory effort.  Cardiovascular: S1 & S2 heard, regular rate and rhythm, no significant murmurs. No extremity edema. 2+ pedal pulses. No significant JVD. Abdomen: No distension, no tenderness, no masses palpated. Bowel sounds normal.  Musculoskeletal: no clubbing / cyanosis. No joint deformity upper and lower extremities. Normal muscle tone.  Skin: no significant rashes, lesions, ulcers. Pale hands and feet.  Neurologic: CN 2-12 grossly intact. Sensation intact, DTR normal. Strength 5/5 in all 4 limbs.  Psychiatric: Normal judgment and insight. Alert and oriented x 3. Normal mood and affect.     Labs on Admission: I have personally reviewed following labs and imaging  studies  CBC:  Recent Labs Lab 01/26/16 1430  WBC 7.2  NEUTROABS 4.4  HGB 9.2*  HCT 27.0*  MCV 91.8  PLT 0000000   Basic Metabolic Panel:  Recent Labs Lab 01/26/16 1430  NA 136  K 3.7  CL 107  CO2 24  GLUCOSE 110*  BUN 25*  CREATININE 1.02  CALCIUM 8.0*   GFR: Estimated Creatinine Clearance: 89.7 mL/min (by C-G formula based on SCr of 1.02 mg/dL). Liver Function Tests:  Recent Labs Lab 01/26/16 1430  AST 19  ALT 36  ALKPHOS 58  BILITOT 0.5  PROT 5.6*  ALBUMIN 3.6   No results for input(s): LIPASE, AMYLASE in the last 168 hours. No results for input(s): AMMONIA in the last 168 hours. Coagulation Profile: No results for input(s): INR, PROTIME in the  last 168 hours. Cardiac Enzymes: No results for input(s): CKTOTAL, CKMB, CKMBINDEX, TROPONINI in the last 168 hours. BNP (last 3 results) No results for input(s): PROBNP in the last 8760 hours. HbA1C: No results for input(s): HGBA1C in the last 72 hours. CBG: No results for input(s): GLUCAP in the last 168 hours. Lipid Profile: No results for input(s): CHOL, HDL, LDLCALC, TRIG, CHOLHDL, LDLDIRECT in the last 72 hours. Thyroid Function Tests: No results for input(s): TSH, T4TOTAL, FREET4, T3FREE, THYROIDAB in the last 72 hours. Anemia Panel: No results for input(s): VITAMINB12, FOLATE, FERRITIN, TIBC, IRON, RETICCTPCT in the last 72 hours. Urine analysis: No results found for: COLORURINE, APPEARANCEUR, LABSPEC, PHURINE, GLUCOSEU, HGBUR, BILIRUBINUR, KETONESUR, PROTEINUR, UROBILINOGEN, NITRITE, LEUKOCYTESUR Sepsis Labs: @LABRCNTIP (procalcitonin:4,lacticidven:4) )No results found for this or any previous visit (from the past 240 hour(s)).   Radiological Exams on Admission: No results found.  EKG: Ordered and pending.   Assessment/Plan  1. Acute GI bleed with symptomatic anemia  - Presents to [facility] with 2 days of bloody diarrhea, ~10 episodes on 8/3 and ~3 on 8/4 - Initial Hgb 9.2; last data point in EMR was  15.9 in 2012  - There is no associated abd pain, nausea, or vomiting  - Upper source possible, BUN is 25 and pt uses NSAID and ASA 81, but no upper GI sxs  - Lower source suspected given relative absence of sxs and known diverticula  - Infectious etiology considered, particularly given his recent travel to [place], but there are no infectious s/s; GI panel and O&P is ordered and UV precautions in place  - Type and screen ordered and blood bank asked to hold 2 units  - Fluid-resuscitate with another liter NS bolus given marginal BP  - Protonix 40 mg IV q12h ordered  - Serial H&H, check INR   2. Diarrhea  - Infection considered, particularly given recent travel to [place] the diarrhea is more likely secondary to the bleeding, rather than infectious diarrhea  - Enteric precautions are in place and GI pathogen panel with O&P ordered  - Will hold off on abx for now given absence of fever or leukocytosis or pain   3. Hyperlipidemia  - Continue Lovaza    4. Allergic rhinitis  - Stable  - Continue daily intranasal steroid and oral antihistamine    DVT prophylaxis: SCD's  Code Status: Full  Family Communication: Discussed with patient Disposition Plan: Admit to telemetry  Consults called: GI consulted by EDP  Admission status: Inpatient    Vianne Bulls, MD [facility] Pager [phone]  If 7PM-7AM, please contact night-coverage [email or web address] Password [facility]  01/26/2016, 9:42 PM

## 2016-01-26 NOTE — ED Provider Notes (Signed)
[place] DEPT [facility] Provider Note   CSN: [id number] Arrival date & time: 01/26/16  1348  First Provider Contact:  None       History   Chief Complaint Chief Complaint  Patient presents with  . Rectal Bleeding    HPI Kent Montgomery is a 60 y.o. male.  Patient presents today with a chief complaint of rectal bleeding.  He reports that he began having diarrhea yesterday and noticed some bright red blood mixed in with his stool.  He reports that he only has the bleeding with the diarrhea.  He does report some generalized weakness and intermittent lightheadedness.  He denies abdominal pain, nausea, vomiting, melena, fever, chills, or any other symptoms.  Denies prior history of GI bleed.  He takes a 81 mg ASA "when he remembers to."  No other anticoagulant.  He reports taking NSAIDS for chronic back pain.  He states that he has had approximately 6-8 doses of NSAIDs over the past 2 weeks.  He drinks a glass of wine daily, but no other alcohol use.  He does report that he was recently in [place] for 10 days and returned twelve days ago.  He states that his wife is also having diarrhea, but she has not noticed any blood in her stool.  He did have a Colonoscopy done in April 2015, which showed polyps and also mild diverticulosis in the sigmoid colon.        Past Medical History:  Diagnosis Date  . Allergy   . Colon polyp   . GERD (gastroesophageal reflux disease)   . Hyperlipidemia   . Skin cancer    basil cell rt arm    Patient Active Problem List   Diagnosis Date Noted  . Abnormal liver enzymes 11/30/2010    Past Surgical History:  Procedure Laterality Date  . APPENDECTOMY    . BACK SURGERY    . COLONOSCOPY    . ELBOW ARTHROSCOPY         Home Medications    Prior to Admission medications   Medication Sig Start Date End Date Taking? Authorizing Provider  aspirin 81 MG tablet Take 81 mg by mouth daily.    Historical Provider, MD  fish oil-omega-3 fatty acids 1000  MG capsule Take 2 g by mouth daily.      Historical Provider, MD  fluticasone (FLONASE) 50 MCG/ACT nasal spray Place into both nostrils daily.    Historical Provider, MD  ibuprofen (ADVIL,MOTRIN) 200 MG tablet Take 400 mg by mouth 2 (two) times daily as needed.      Historical Provider, MD  loratadine (CLARITIN) 10 MG tablet Take 10 mg by mouth daily.    Historical Provider, MD  Multiple Vitamin (MULTIVITAMIN PO) Take 1 capsule by mouth daily.      Historical Provider, MD  Psyllium (METAMUCIL PO) Take 1 capsule by mouth daily.      Historical Provider, MD    Family History Family History  Problem Relation Age of Onset  . Cancer Maternal Grandfather     bladder  . Prostate cancer Father   . Colon polyps Father   . Heart disease Father   . Nephrolithiasis Father   . Stomach cancer Father   . Colon cancer Neg Hx   . Esophageal cancer Neg Hx   . Rectal cancer Neg Hx     Social History Social History  Substance Use Topics  . Smoking status: Never Smoker  . Smokeless tobacco: Never Used  . Alcohol use  6.0 oz/week    10 Glasses of wine per week     Comment: Wine     Allergies   Droperidol and Cefuroxime axetil   Review of Systems Review of Systems  All other systems reviewed and are negative.    Physical Exam Updated Vital Signs BP 99/80   Pulse 118   Temp 97.9 F (36.6 C) (Oral)   Resp 20   Ht 5\' 9"  (1.753 m)   Wt 92.5 kg   SpO2 100%   BMI 30.13 kg/m   Physical Exam  Constitutional: He appears well-developed and well-nourished.  HENT:  Head: Normocephalic and atraumatic.  Neck: Normal range of motion. Neck supple.  Cardiovascular: Normal rate, regular rhythm and normal heart sounds.   Pulmonary/Chest: Effort normal and breath sounds normal.  Abdominal: Soft. Bowel sounds are normal. He exhibits no distension and no mass. There is no tenderness. There is no rebound and no guarding. No hernia.  Genitourinary:  Genitourinary Comments: Non thrombosed external  hemorrhoid Stool maroon in color  Musculoskeletal: Normal range of motion.  Neurological: He is alert.  Skin: Skin is warm and dry.  Nursing note and vitals reviewed.    ED Treatments / Results  Labs (all labs ordered are listed, but only abnormal results are displayed) Labs Reviewed  CBC WITH DIFFERENTIAL/PLATELET - Abnormal; Notable for the following:       Result Value   RBC 2.94 (*)    Hemoglobin 9.2 (*)    HCT 27.0 (*)    All other components within normal limits  OVA + PARASITE EXAM  COMPREHENSIVE METABOLIC PANEL  OCCULT BLOOD X 1 CARD TO LAB, STOOL    EKG  EKG Interpretation None       Radiology No results found.  Procedures Procedures (including critical care time)  Medications Ordered in ED Medications  sodium chloride 0.9 % bolus 1,000 mL (1,000 mLs Intravenous New Bag/Given 01/26/16 1440)     Initial Impression / Assessment and Plan / ED Course  I have reviewed the triage vital signs and the nursing notes.  Pertinent labs & imaging results that were available during my care of the patient were reviewed by me and considered in my medical decision making (see chart for details).  Clinical Course   Patient presents today with diarrhea and hematochezia onset yesterday.  He also reports feeling lightheaded.  He states that he was recently in [place] and returned 12 days ago.  He denies any vomiting or abdominal pain.  Hemoglobin today is 9.2, which seems to have dropped significantly from his baseline.  On exam, he has some maroon colored stool.  Discussed with Dr Idelle Jo with [facility] who recommended admitting the patient to [facility].  Discussed with Hospitalist DR Denton Brick who agreed to admit the patient.    Final Clinical Impressions(s) / ED Diagnoses   Final diagnoses:  None    New Prescriptions New Prescriptions   No medications on file     Hyman Bible, PA-C 01/26/16 [place], PA-C 01/26/16 2118    Blanchie Dessert, MD 02/04/16 2150

## 2016-01-26 NOTE — ED Notes (Signed)
Attempted report x1. 

## 2016-01-26 NOTE — ED Notes (Signed)
Pt deciding to be transported by [facility] instead of [facility].

## 2016-01-26 NOTE — ED Triage Notes (Signed)
Pt c/o rectal bleeding bright red  , SOB and weakness  X 3 days

## 2016-01-26 NOTE — ED Notes (Signed)
Nira Conn okay with sending pt private vehicle with IV.  IV wrapped in cling at this time.

## 2016-01-26 NOTE — ED Notes (Signed)
Spoke with Helen Hashimoto, PA and confirmed that pt should be NPO.  Santiago Glad will change order.

## 2016-01-26 NOTE — Progress Notes (Signed)
Obtained report from [place], Therapist, sports at [place].

## 2016-01-27 DIAGNOSIS — K922 Gastrointestinal hemorrhage, unspecified: Secondary | ICD-10-CM

## 2016-01-27 DIAGNOSIS — D509 Iron deficiency anemia, unspecified: Secondary | ICD-10-CM

## 2016-01-27 DIAGNOSIS — K274 Chronic or unspecified peptic ulcer, site unspecified, with hemorrhage: Secondary | ICD-10-CM

## 2016-01-27 DIAGNOSIS — K5730 Diverticulosis of large intestine without perforation or abscess without bleeding: Secondary | ICD-10-CM

## 2016-01-27 DIAGNOSIS — A09 Infectious gastroenteritis and colitis, unspecified: Secondary | ICD-10-CM

## 2016-01-27 LAB — GASTROINTESTINAL PANEL BY PCR, STOOL (REPLACES STOOL CULTURE)
ADENOVIRUS F40/41: NOT DETECTED
Astrovirus: NOT DETECTED
CAMPYLOBACTER SPECIES: NOT DETECTED
Cryptosporidium: NOT DETECTED
Cyclospora cayetanensis: NOT DETECTED
E. coli O157: NOT DETECTED
ENTAMOEBA HISTOLYTICA: NOT DETECTED
ENTEROAGGREGATIVE E COLI (EAEC): NOT DETECTED
ENTEROPATHOGENIC E COLI (EPEC): DETECTED — AB
Enterotoxigenic E coli (ETEC): NOT DETECTED
GIARDIA LAMBLIA: NOT DETECTED
NOROVIRUS GI/GII: NOT DETECTED
Plesimonas shigelloides: NOT DETECTED
Rotavirus A: NOT DETECTED
SALMONELLA SPECIES: NOT DETECTED
SHIGELLA/ENTEROINVASIVE E COLI (EIEC): NOT DETECTED
Sapovirus (I, II, IV, and V): NOT DETECTED
Shiga like toxin producing E coli (STEC): NOT DETECTED
VIBRIO CHOLERAE: NOT DETECTED
Vibrio species: NOT DETECTED
YERSINIA ENTEROCOLITICA: NOT DETECTED

## 2016-01-27 LAB — C DIFFICILE QUICK SCREEN W PCR REFLEX
C DIFFICILE (CDIFF) TOXIN: NEGATIVE
C DIFFICLE (CDIFF) ANTIGEN: NEGATIVE
C Diff interpretation: NOT DETECTED

## 2016-01-27 LAB — BASIC METABOLIC PANEL
Anion gap: 4 — ABNORMAL LOW (ref 5–15)
BUN: 17 mg/dL (ref 6–20)
CO2: 25 mmol/L (ref 22–32)
CREATININE: 0.88 mg/dL (ref 0.61–1.24)
Calcium: 7.6 mg/dL — ABNORMAL LOW (ref 8.9–10.3)
Chloride: 108 mmol/L (ref 101–111)
GFR calc Af Amer: >60 mL/min (ref >60)
GLUCOSE: 108 mg/dL — AB (ref 65–99)
POTASSIUM: 4 mmol/L (ref 3.5–5.1)
SODIUM: 137 mmol/L (ref 135–145)

## 2016-01-27 LAB — RETICULOCYTES
RBC.: 2.19 MIL/uL — AB (ref 4.22–5.81)
RETIC CT PCT: 3.2 % — AB (ref 0.4–3.1)
Retic Count, Absolute: 70.1 10*3/uL (ref 19.0–186.0)

## 2016-01-27 LAB — IRON AND TIBC
Iron: 17 ug/dL — ABNORMAL LOW (ref 45–182)
SATURATION RATIOS: 6 % — AB (ref 17.9–39.5)
TIBC: 290 ug/dL (ref 250–450)
UIBC: 273 ug/dL

## 2016-01-27 LAB — HEMOGLOBIN AND HEMATOCRIT, BLOOD
HEMATOCRIT: 22 % — AB (ref 39.0–52.0)
HEMOGLOBIN: 7.4 g/dL — AB (ref 13.0–17.0)

## 2016-01-27 LAB — GLUCOSE, CAPILLARY: Glucose-Capillary: 110 mg/dL — ABNORMAL HIGH (ref 65–99)

## 2016-01-27 LAB — PREPARE RBC (CROSSMATCH)

## 2016-01-27 LAB — HEMATOCRIT: HEMATOCRIT: 20.6 % — AB (ref 39.0–52.0)

## 2016-01-27 LAB — FERRITIN: Ferritin: 5 ng/mL — ABNORMAL LOW (ref 24–336)

## 2016-01-27 LAB — FOLATE: Folate: 19 ng/mL (ref >5.9)

## 2016-01-27 LAB — VITAMIN B12: VITAMIN B 12: 175 pg/mL — AB (ref 180–914)

## 2016-01-27 LAB — HEMOGLOBIN: HEMOGLOBIN: 7 g/dL — AB (ref 13.0–17.0)

## 2016-01-27 MED ORDER — PEG-KCL-NACL-NASULF-NA ASC-C 100 G PO SOLR: 1.0000 | Freq: Once | ORAL | Status: DC

## 2016-01-27 MED ORDER — PEG-KCL-NACL-NASULF-NA ASC-C 100 G PO SOLR
0.5000 | Freq: Once | ORAL | Status: AC
  Administered 2016-01-27: 100 g via ORAL
  Filled 2016-01-27: qty 1

## 2016-01-27 MED ORDER — POTASSIUM CHLORIDE IN NACL 20-0.9 MEQ/L-% IV SOLN
INTRAVENOUS | Status: DC
  Administered 2016-01-27 – 2016-01-30 (×4): via INTRAVENOUS
  Filled 2016-01-27 (×6): qty 1000

## 2016-01-27 MED ORDER — CIPROFLOXACIN HCL 500 MG PO TABS
500.0000 mg | ORAL_TABLET | Freq: Two times a day (BID) | ORAL | Status: DC
  Administered 2016-01-27 – 2016-01-29 (×4): 500 mg via ORAL
  Filled 2016-01-27 (×4): qty 1

## 2016-01-27 MED ORDER — SODIUM CHLORIDE 0.9 % IV SOLN: Freq: Once | INTRAVENOUS | Status: DC

## 2016-01-27 NOTE — Progress Notes (Addendum)
[facility] PROGRESS NOTE  Kent Montgomery [id number] DOB: 1956/02/18 DOA: 01/26/2016   PCP: Haywood Pao, MD     Assessment/Plan: Principal Problem:   GI bleeding Active Problems:   Symptomatic anemia   Bloody diarrhea   Sigmoid diverticulosis   History of colonic polyps   Hyperlipidemia   Acute GI bleeding   60 y.o. male with medical history significant for hyperlipidemia, allergic rhinitis, colonic polyps, and sigmoid diverticulosis who presents to the emergency department with bloody diarrhea of 2 days' duration. Patient reports pain in his usual state of health, having returned from a trip to [place] 12 days ago, in until the development of bloody diarrhea yesterday.He has had 2 colonoscopies, most recently in April 2015, notable for polyps and mild sigmoid diverticulosis. Hypotensive in the ER.Dr. Havery Moros of GI was consulted by the ED physician   Assessment and plan 1. Acute GI bleed with symptomatic anemia  - Presents to [facility] with 2 days of bloody diarrhea, ~10 episodes on 8/3 and ~3 on 8/4, none today and overnight - Initial Hgb 9.2; now hemoglobin is down to 7 Baseline hemoglobin 15.9 in 2012 , anemia panel ordered Patient still having clay colored stools - Upper source possible as the patient takes aspirin on a regular basis, BUN is 25 and pt uses NSAID and ASA 81, not on any PPI or anticipate at home -- Infectious etiology considered, particularly given his recent travel to [place], follow stool studies, added C. difficile Transfuse 1 unit of packed red blood cells today may need another unit Continue IV fluids - Protonix 40 mg IV q12h ordered  INR 1.28 Requester Dr Dr. Havery Moros of GI to evaluate  2. Diarrhea, Enteropathogenic e coli - Infection considered, particularly given recent travel to [place] the diarrhea is more likely secondary to EPEC, cannot r/o Gi bleed due to other causes   - Enteric precautions are in place  and GI pathogen panel with O&P ordered  - Will hold off on abx for now given absence of fever or leukocytosis or pain  GI consultation requested 8/4  3. Hyperlipidemia  - Continue Lovaza    4. Allergic rhinitis  - Stable  - Continue daily intranasal steroid and oral antihistamine      DVT prophylaxsis SCDs  Code Status:      Code Status Orders Full code        Family Communication: Discussed in detail with the patient, all imaging results, lab results explained to the patient   Disposition Plan:  Follow results of stool studies     Consultants:  GI  Procedures:  None  Antibiotics: Anti-infectives    None         HPI/Subjective: Denies any BM since yesterday  Objective: Vitals:   01/26/16 1938 01/26/16 1947 01/26/16 2113 01/27/16 0614  BP:  113/64 (!) 121/59 (!) 110/53  Pulse:  89 90 79  Resp: 16 16 18 16   Temp:  99 F (37.2 C) 98.9 F (37.2 C) 97.9 F (36.6 C)  TempSrc:   Oral Oral  SpO2:  100% 95% 100%  Weight:   94.8 kg (208 lb 15.9 oz)   Height:        Intake/Output Summary (Last 24 hours) at 01/27/16 0927 Last data filed at 01/27/16 0630  Gross per 24 hour  Intake              120 ml  Output  300 ml  Net             -180 ml    Exam:  Examination:  General exam: Appears calm and comfortable  Respiratory system: Clear to auscultation. Respiratory effort normal. Cardiovascular system: S1 & S2 heard, RRR. No JVD, murmurs, rubs, gallops or clicks. No pedal edema. Gastrointestinal system: Abdomen is nondistended, soft and nontender. No organomegaly or masses felt. Normal bowel sounds heard. Central nervous system: Alert and oriented. No focal neurological deficits. Extremities: Symmetric 5 x 5 power. Skin: No rashes, lesions or ulcers Psychiatry: Judgement and insight appear normal. Mood & affect appropriate.     Data Reviewed: I have personally reviewed following labs and imaging studies  Micro Results No results  found for this or any previous visit (from the past 240 hour(s)).  Radiology Reports No results found.   CBC  Recent Labs Lab 01/26/16 1430 01/26/16 2215 01/27/16 0600  WBC 7.2  --   --   HGB 9.2* 7.9* 7.0*  HCT 27.0* 23.5* 20.6*  PLT 175  --   --   MCV 91.8  --   --   MCH 31.3  --   --   MCHC 34.1  --   --   RDW 12.5  --   --   LYMPHSABS 2.2  --   --   MONOABS 0.5  --   --   EOSABS 0.1  --   --   BASOSABS 0.0  --   --     Chemistries   Recent Labs Lab 01/26/16 1430 01/27/16 0600  NA 136 137  K 3.7 4.0  CL 107 108  CO2 24 25  GLUCOSE 110* 108*  BUN 25* 17  CREATININE 1.02 0.88  CALCIUM 8.0* 7.6*  AST 19  --   ALT 36  --   ALKPHOS 58  --   BILITOT 0.5  --    ------------------------------------------------------------------------------------------------------------------ estimated creatinine clearance is 103.9 mL/min (by C-G formula based on SCr of 0.88 mg/dL). ------------------------------------------------------------------------------------------------------------------ No results for input(s): HGBA1C in the last 72 hours. ------------------------------------------------------------------------------------------------------------------ No results for input(s): CHOL, HDL, LDLCALC, TRIG, CHOLHDL, LDLDIRECT in the last 72 hours. ------------------------------------------------------------------------------------------------------------------ No results for input(s): TSH, T4TOTAL, T3FREE, THYROIDAB in the last 72 hours.  Invalid input(s): FREET3 ------------------------------------------------------------------------------------------------------------------  Recent Labs  01/26/16 2215  RETICCTPCT 2.3    Coagulation profile  Recent Labs Lab 01/26/16 2215  INR 1.28    No results for input(s): DDIMER in the last 72 hours.  Cardiac Enzymes No results for input(s): CKMB, TROPONINI, MYOGLOBIN in the last 168 hours.  Invalid input(s):  CK ------------------------------------------------------------------------------------------------------------------ Invalid input(s): POCBNP   CBG:  Recent Labs Lab 01/27/16 0754  GLUCAP 110*       Studies: No results found.    No results found for: HGBA1C Lab Results  Component Value Date   CREATININE 0.88 01/27/2016       Scheduled Meds: . fluticasone  2 spray Each Nare Daily  . loratadine  10 mg Oral Daily  . omega-3 acid ethyl esters  2 g Oral Daily  . pantoprazole  40 mg Intravenous Q12H  . sodium chloride flush  3 mL Intravenous Q12H   Continuous Infusions: . 0.9 % NaCl with KCl 20 mEq / L       LOS: 1 day    Time spent: >30 MINS    [facility] Pager [phone]. If 7PM-7AM, please contact night-coverage at [email or web address], password [email or web address] 01/27/2016, 9:27 AM  LOS: 1 day

## 2016-01-27 NOTE — Consult Note (Signed)
Consultation  Referring Provider: [facility]/Dr Allyson Sabal Primary Care Physician:  Haywood Pao, MD Primary Gastroenterologist:  Dr.Pyrtle  Reason for Consultation:   Fabienne Bruns bleed  HPI: Kent Montgomery is a 60 y.o. male  known to Dr. Hilarie Fredrickson from prior colonoscopy who was admitted last night through the emergency room with significant rectal bleeding weakness and lightheadedness. Patient states that his symptoms started on Wednesday evening with bloody liquid bowel movements. He had 10-12 bowel movements on Thursday, 01/25/2016 all grossly bloody. He had no associated abdominal pain and cramping, nausea vomiting fever or chills. Yesterday he had about 3 similar bright red bloody bowel movements at home and was feeling more lightheaded and weak so came to the emergency room. Hemoglobin on admission 9.2 hematocrit of 27 and MCV 91 and WBC of 7.2. Overnight his hemoglobin drifted down to 7 and he is currently being transfused 2 units. Last bowel movements were about 3 AM today very small volume and maroonish. Patient is no history of previous GI bleeding. He and his wife are both concerned because they recently returned from [place] and his wife has been sick with diarrhea. He says he had a little bit of diarrhea while he was in [place] but not since they returned home. Last colonoscopy was done in April 2015 with finding of multiple colon polyps and mild diverticulosis. Path revealed 4 tubular adenomas and 1 sessile serrated adenoma and he was recommended for 3 year interval follow-up. Is otherwise generally healthy does take occasional NSAIDs but had not been taking anything on a regular basis. He does drink a glass of wanting nightly.   Past Medical History:  Diagnosis Date  . Allergy   . Colon polyp   . GERD (gastroesophageal reflux disease)   . Hyperlipidemia   . Skin cancer    basil cell rt arm    Past Surgical History:  Procedure Laterality Date  . APPENDECTOMY    . BACK  SURGERY    . COLONOSCOPY    . ELBOW ARTHROSCOPY      Prior to Admission medications   Medication Sig Start Date End Date Taking? Authorizing Provider  aspirin 81 MG tablet Take 81 mg by mouth daily.    Historical Provider, MD  fish oil-omega-3 fatty acids 1000 MG capsule Take 2 g by mouth daily.      Historical Provider, MD  fluticasone (FLONASE) 50 MCG/ACT nasal spray Place into both nostrils daily.    Historical Provider, MD  ibuprofen (ADVIL,MOTRIN) 200 MG tablet Take 400 mg by mouth 2 (two) times daily as needed.      Historical Provider, MD  loratadine (CLARITIN) 10 MG tablet Take 10 mg by mouth daily.    Historical Provider, MD  Multiple Vitamin (MULTIVITAMIN PO) Take 1 capsule by mouth daily.      Historical Provider, MD  Psyllium (METAMUCIL PO) Take 1 capsule by mouth daily.      Historical Provider, MD    Current Facility-Administered Medications  Medication Dose Route Frequency Provider Last Rate Last Dose  . 0.9 % NaCl with KCl 20 mEq/ L  infusion   Intravenous Continuous Reyne Dumas, MD      . acetaminophen (TYLENOL) tablet 650 mg  650 mg Oral Q6H PRN Vianne Bulls, MD   650 mg at 01/26/16 2316   Or  . acetaminophen (TYLENOL) suppository 650 mg  650 mg Rectal Q6H PRN Vianne Bulls, MD      . fluticasone (FLONASE) 50 MCG/ACT nasal  spray 2 spray  2 spray Each Nare Daily Ilene Qua Opyd, MD      . HYDROcodone-acetaminophen (NORCO/VICODIN) 5-325 MG per tablet 1-2 tablet  1-2 tablet Oral Q4H PRN Vianne Bulls, MD      . loratadine (CLARITIN) tablet 10 mg  10 mg Oral Daily Timothy S Opyd, MD      . omega-3 acid ethyl esters (LOVAZA) capsule 2 g  2 g Oral Daily Timothy S Opyd, MD      . ondansetron (ZOFRAN) tablet 4 mg  4 mg Oral Q6H PRN Vianne Bulls, MD       Or  . ondansetron (ZOFRAN) injection 4 mg  4 mg Intravenous Q6H PRN Ilene Qua Opyd, MD      . pantoprazole (PROTONIX) injection 40 mg  40 mg Intravenous Q12H Vianne Bulls, MD   40 mg at 01/27/16 0941  . sodium chloride  flush (NS) 0.9 % injection 3 mL  3 mL Intravenous Q12H Vianne Bulls, MD        Allergies as of 01/26/2016 - Review Complete 01/26/2016  Allergen Reaction Noted  . Droperidol Anaphylaxis 10/26/2010  . Cefuroxime axetil Rash 10/26/2010    Family History  Problem Relation Age of Onset  . Cancer Maternal Grandfather     bladder  . Prostate cancer Father   . Colon polyps Father   . Heart disease Father   . Nephrolithiasis Father   . Stomach cancer Father   . Colon cancer Neg Hx   . Esophageal cancer Neg Hx   . Rectal cancer Neg Hx     Social History   Social History  . Marital status: Married    Spouse name: N/A  . Number of children: 3  . Years of education: N/A   Occupational History  . 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 Smoking status: Never Smoker  . Smokeless tobacco: Never Used  . Alcohol use 6.0 oz/week    10 Glasses of wine per week     Comment: Wine  . Drug use: No  . Sexual activity: Not on file   Other Topics Concern  . Not on file   Social History Narrative  . No narrative on file    Review of Systems: Gen: Denies any fever, chills, sweats, anorexia, fatigue,+ lightheaded CV: Denies chest pain, angina, palpitations, syncope, orthopnea, PND, peripheral edema, and claudication. Resp: Denies dyspnea at rest, dyspnea with exercise, cough, sputum, wheezing, coughing up blood, and pleurisy. GI:as above GU : Denies urinary burning, blood in urine, urinary frequency, urinary hesitancy, nocturnal urination, and urinary incontinence. MS: Denies joint pain, limitation of movement, and swelling, stiffness, low back pain, extremity pain. Denies muscle weakness, cramps, atrophy.  Derm: Denies rash, itching, dry skin, hives, moles, warts, or unhealing ulcers.  Psych: Denies depression, anxiety, memory loss, suicidal ideation, hallucinations, paranoia, and confusion. Heme: Denies bruising, bleeding, and enlarged lymph nodes. Neuro:   Denies any headaches, dizziness, paresthesias. Endo:  Denies any problems with DM, thyroid, adrenal function.  Physical Exam: Vital signs in last 24 hours: Temp:  [97.9 F (36.6 C)-99 F (37.2 C)] 98.1 F (36.7 C) (08/05 1058) Pulse Rate:  [79-118] 89 (08/05 1058) Resp:  [16-21] 16 (08/05 1058) BP: (98-121)/(53-80) 110/59 (08/05 1058) SpO2:  [95 %-100 %] 100 % (08/05 1058) Weight:  [204 lb (92.5 kg)-208 lb 15.9 oz (94.8 kg)] 208 lb 15.9 oz (94.8 kg) (08/04 2113) Last BM Date: 01/27/16 General:  Alert,  Well-developed, well-nourished,WM  pleasant and cooperative in NAD, pale  Wife at bedside Head:  Normocephalic and atraumatic. Eyes:  Sclera clear, no icterus.   Conjunctiva pink. Ears:  Normal auditory acuity. Nose:  No deformity, discharge,  or lesions. Mouth:  No deformity or lesions.   Neck:  Supple; no masses or thyromegaly. Lungs:  Clear throughout to auscultation.   No wheezes, crackles, or rhonchi. Heart:  Regular rate and rhythm; no murmurs, clicks, rubs,  or gallops. Abdomen:  Soft,nontender, BS active,nonpalp mass or hsm.   Rectal:  Not done , maroon stool on rectal in ER yesterday Msk:  Symmetrical without gross deformities. . Pulses:  Normal pulses noted. Extremities:  Without clubbing or edema. Neurologic:  Alert and  oriented x4;  grossly normal neurologically. Skin:  Intact without significant lesions or rashes.. Psych:  Alert and cooperative. Normal mood and affect.  Intake/Output from previous day: 08/04 0701 - 08/05 0700 In: 120 [P.O.:120] Out: 300 [Urine:300] Intake/Output this shift: Total I/O In: 0  Out: 900 [Urine:900]  Lab Results:  Recent Labs  01/26/16 1430 01/26/16 2215 01/27/16 0600  WBC 7.2  --   --   HGB 9.2* 7.9* 7.0*  HCT 27.0* 23.5* 20.6*  PLT 175  --   --    BMET  Recent Labs  01/26/16 1430 01/27/16 0600  NA 136 137  K 3.7 4.0  CL 107 108  CO2 24 25  GLUCOSE 110* 108*  BUN 25* 17  CREATININE 1.02 0.88  CALCIUM 8.0*  7.6*   LFT  Recent Labs  01/26/16 1430  PROT 5.6*  ALBUMIN 3.6  AST 19  ALT 36  ALKPHOS 58  BILITOT 0.5   PT/INR  Recent Labs  01/26/16 2215  LABPROT 16.1*  INR 1.28   Hepatitis Panel No results for input(s): HEPBSAG, HCVAB, HEPAIGM, HEPBIGM in the last 72 hours.     IMPRESSION:  #68  60 year old white male with acute lower GI bleed. The volume of blood loss and lack of any other symptoms speaks against an infectious etiology. Suspect this is simply an acute lower GI bleed most likely diverticular. Fortunately bleeding seems to be resolving. He is hemodynamically stable #2 anemia / Possible on chronic with new finding of iron deficiency this admit as well-acute secondary to acute blood loss #3 history of multiple colon polyps, tubular adenomas and a sessile serrated polyp due for follow-up colonoscopy spring 2018    PLAN: Start clear liquids today Transfuse to keep hemoglobin in the 7-8 range Will plan for bowel prep this evening, and colonoscopy+/-EGD tomorrow with Dr. Havery Moros. Procedures discussed in detail with the patient and his wife and they're agreeable to proceed. Should he have more active ongoing hemorrhage later in the day today would proceed to bleeding scan and possible angiogram for embolization. Thank you for the consult we will follow with you    Amy Esterwood  01/27/2016, 11:12 AM

## 2016-01-27 NOTE — Progress Notes (Signed)
CRITICAL VALUE ALERT  Critical value received:  Gi panel + ecoli  Date of notification:  01/27/16  Time of notification:  15:25  Critical value read back: yes  Nurse who received alert:  Cassell Smiles  MD notified (1st page):  Abrol  Time of first page:  15:46  MD notified (2nd page):  Time of second page:  Responding MD:  Allyson Sabal  Time MD responded:  15:47

## 2016-01-28 ENCOUNTER — Encounter (HOSPITAL_COMMUNITY): Payer: Self-pay | Admitting: *Deleted

## 2016-01-28 ENCOUNTER — Encounter (HOSPITAL_COMMUNITY)
Admission: EM | Disposition: A | Discharge status: Home / Self Care | Payer: Self-pay | Source: Home / Self Care | Attending: Internal Medicine

## 2016-01-28 HISTORY — PX: ESOPHAGOGASTRODUODENOSCOPY: SHX5428

## 2016-01-28 HISTORY — PX: COLONOSCOPY: SHX5424

## 2016-01-28 LAB — COMPREHENSIVE METABOLIC PANEL
ALBUMIN: 2.9 g/dL — AB (ref 3.5–5.0)
ALK PHOS: 42 U/L (ref 38–126)
ALT: 25 U/L (ref 17–63)
ALT: 24 U/L (ref 17–63)
ANION GAP: 4 — AB (ref 5–15)
ANION GAP: 2 — AB (ref 5–15)
AST: 16 U/L (ref 15–41)
AST: 18 U/L (ref 15–41)
Albumin: 2.7 g/dL — ABNORMAL LOW (ref 3.5–5.0)
Alkaline Phosphatase: 42 U/L (ref 38–126)
BUN: 10 mg/dL (ref 6–20)
BUN: 9 mg/dL (ref 6–20)
CALCIUM: 7.2 mg/dL — AB (ref 8.9–10.3)
CHLORIDE: 110 mmol/L (ref 101–111)
CHLORIDE: 108 mmol/L (ref 101–111)
CO2: 24 mmol/L (ref 22–32)
CO2: 24 mmol/L (ref 22–32)
CREATININE: 1 mg/dL (ref 0.61–1.24)
CREATININE: 1.03 mg/dL (ref 0.61–1.24)
Calcium: 7.4 mg/dL — ABNORMAL LOW (ref 8.9–10.3)
GFR calc Af Amer: >60 mL/min (ref >60)
GFR calc non Af Amer: >60 mL/min (ref >60)
GLUCOSE: 79 mg/dL (ref 65–99)
Glucose, Bld: 109 mg/dL — ABNORMAL HIGH (ref 65–99)
POTASSIUM: 3.6 mmol/L (ref 3.5–5.1)
Potassium: 3.5 mmol/L (ref 3.5–5.1)
SODIUM: 138 mmol/L (ref 135–145)
SODIUM: 134 mmol/L — AB (ref 135–145)
Total Bilirubin: 0.9 mg/dL (ref 0.3–1.2)
Total Bilirubin: 0.5 mg/dL (ref 0.3–1.2)
Total Protein: 4.2 g/dL — ABNORMAL LOW (ref 6.5–8.1)
Total Protein: 4.5 g/dL — ABNORMAL LOW (ref 6.5–8.1)

## 2016-01-28 LAB — GLUCOSE, CAPILLARY: GLUCOSE-CAPILLARY: 104 mg/dL — AB (ref 65–99)

## 2016-01-28 LAB — CBC
HCT: 22 % — ABNORMAL LOW (ref 39.0–52.0)
HEMOGLOBIN: 7.5 g/dL — AB (ref 13.0–17.0)
MCH: 29.8 pg (ref 26.0–34.0)
MCHC: 34.1 g/dL (ref 30.0–36.0)
MCV: 87.3 fL (ref 78.0–100.0)
PLATELETS: 118 10*3/uL — AB (ref 150–400)
RBC: 2.52 MIL/uL — AB (ref 4.22–5.81)
RDW: 16 % — ABNORMAL HIGH (ref 11.5–15.5)
WBC: 5.1 10*3/uL (ref 4.0–10.5)

## 2016-01-28 LAB — HEMOGLOBIN AND HEMATOCRIT, BLOOD
HEMATOCRIT: 20.5 % — AB (ref 39.0–52.0)
Hemoglobin: 6.8 g/dL — CL (ref 13.0–17.0)

## 2016-01-28 LAB — PREPARE RBC (CROSSMATCH)

## 2016-01-28 SURGERY — COLONOSCOPY
Anesthesia: Moderate Sedation

## 2016-01-28 MED ORDER — SODIUM CHLORIDE 0.9 % IV SOLN
INTRAVENOUS | Status: DC
  Administered 2016-01-28 (×2): 500 mL via INTRAVENOUS

## 2016-01-28 MED ORDER — SPOT INK MARKER SYRINGE KIT
PACK | SUBMUCOSAL | Status: AC
  Filled 2016-01-28: qty 5

## 2016-01-28 MED ORDER — FENTANYL CITRATE (PF) 100 MCG/2ML IJ SOLN
INTRAMUSCULAR | Status: DC | PRN
  Administered 2016-01-28 (×4): 25 ug via INTRAVENOUS

## 2016-01-28 MED ORDER — DIPHENHYDRAMINE HCL 50 MG/ML IJ SOLN
INTRAMUSCULAR | Status: AC
  Filled 2016-01-28: qty 1

## 2016-01-28 MED ORDER — ACETAMINOPHEN 325 MG PO TABS
650.0000 mg | ORAL_TABLET | Freq: Once | ORAL | Status: AC
  Administered 2016-01-28: 650 mg via ORAL
  Filled 2016-01-28: qty 2

## 2016-01-28 MED ORDER — SODIUM CHLORIDE 0.9 % IV SOLN
Freq: Once | INTRAVENOUS | Status: AC
  Administered 2016-01-28: 02:00:00 via INTRAVENOUS

## 2016-01-28 MED ORDER — EPINEPHRINE HCL 0.1 MG/ML IJ SOSY
PREFILLED_SYRINGE | INTRAMUSCULAR | Status: AC
  Filled 2016-01-28: qty 10

## 2016-01-28 MED ORDER — DIPHENHYDRAMINE HCL 25 MG PO CAPS
25.0000 mg | ORAL_CAPSULE | Freq: Once | ORAL | Status: AC
  Administered 2016-01-28: 25 mg via ORAL
  Filled 2016-01-28: qty 1

## 2016-01-28 MED ORDER — SODIUM CHLORIDE 0.9 % IV SOLN
125.0000 mg | Freq: Once | INTRAVENOUS | Status: AC
  Administered 2016-01-28: 125 mg via INTRAVENOUS
  Filled 2016-01-28: qty 10

## 2016-01-28 MED ORDER — MIDAZOLAM HCL 5 MG/ML IJ SOLN
INTRAMUSCULAR | Status: AC
  Filled 2016-01-28: qty 2

## 2016-01-28 MED ORDER — FENTANYL CITRATE (PF) 100 MCG/2ML IJ SOLN
INTRAMUSCULAR | Status: AC
  Filled 2016-01-28: qty 4

## 2016-01-28 MED ORDER — DIPHENHYDRAMINE HCL 50 MG/ML IJ SOLN
INTRAMUSCULAR | Status: DC | PRN
  Administered 2016-01-28: 25 mg via INTRAVENOUS

## 2016-01-28 MED ORDER — MIDAZOLAM HCL 10 MG/2ML IJ SOLN
INTRAMUSCULAR | Status: DC | PRN
  Administered 2016-01-28 (×2): 2 mg via INTRAVENOUS
  Administered 2016-01-28 (×2): 1 mg via INTRAVENOUS

## 2016-01-28 NOTE — Op Note (Signed)
[facility] Patient Name: Nora Crick Procedure Date : 01/28/2016 MRN: [id number] Attending MD: Carlota Raspberry. Havery Moros , MD Date of Birth: 02-26-1956 CSN: [id number] Age: 60 Admit Type: Inpatient Procedure:                Upper GI endoscopy Indications:              Gastrointestinal bleeding of unknown origin Providers:                Carlota Raspberry. Havery Moros, MD, Kingsley Plan, RN,                            Corliss Parish, Technician Referring MD:              Medicines:                Fentanyl 50 micrograms IV, Midazolam 4 mg IV,                            Diphenhydramine 25 mg IV Complications:            No immediate complications. Estimated blood loss:                            None. Estimated Blood Loss:     Estimated blood loss: none. Procedure:                Pre-Anesthesia Assessment:                           - Prior to the procedure, a History and Physical                            was performed, and patient medications and                            allergies were reviewed. The patient's tolerance of                            previous anesthesia was also reviewed. The risks                            and benefits of the procedure and the sedation                            options and risks were discussed with the patient.                            All questions were answered, and informed consent                            was obtained. Prior Anticoagulants: The patient has                            taken no previous anticoagulant or antiplatelet  agents. ASA Grade Assessment: III - A patient with                            severe systemic disease. After reviewing the risks                            and benefits, the patient was deemed in                            satisfactory condition to undergo the procedure.                           After obtaining informed consent, the endoscope was                            passed  under direct vision. Throughout the                            procedure, the patient's blood pressure, pulse, and                            oxygen saturations were monitored continuously. The                            was introduced through the mouth, and advanced to                            the second part of duodenum. The upper GI endoscopy                            was accomplished without difficulty. The patient                            tolerated the procedure well. Scope In: Scope Out: Findings:      Esophagogastric landmarks were identified: the Z-line was found at 42       cm, the gastroesophageal junction was found at 42 cm and the upper       extent of the gastric folds was found at 42 cm from the incisors.      The exam of the esophagus was otherwise normal.      The entire examined stomach was normal. No blood noted in the stomach.      The duodenal bulb and second portion of the duodenum were normal. No       blood noted in the small bowel. Impression:               - Esophagogastric landmarks identified.                           - Normal stomach.                           - Normal duodenal bulb and second portion of the  duodenum.                           Overall no evidenence of pathology to cause the                            patient's symptoms. Moderate Sedation:      Moderate (conscious) sedation was administered by the endoscopy nurse       and supervised by the endoscopist. The following parameters were       monitored: oxygen saturation, heart rate, blood pressure, and response       to care. Total physician intraservice time was 10 minutes. Recommendation:           - Return patient to hospital ward for ongoing care.                           - Clear liquid diet.                           - Continue present medications.                           - Further recommendations per colonoscopy report Procedure Code(s):        ---  Professional ---                           [phone], Esophagogastroduodenoscopy, flexible,                            transoral; diagnostic, including collection of                            specimen(s) by brushing or washing, when performed                            (separate procedure)                           99152, Moderate sedation services provided by the                            same physician or other qualified health care                            professional performing the diagnostic or                            therapeutic service that the sedation supports,                            requiring the presence of an independent trained                            observer to assist in the monitoring of the  patient's level of consciousness and physiological                            status; initial 15 minutes of intraservice time,                            patient age 72 years or older Diagnosis Code(s):        --- Professional ---                           K92.2, Gastrointestinal hemorrhage, unspecified CPT copyright 2016 American Medical Association. All rights reserved. The codes documented in this report are preliminary and upon coder review may  be revised to meet current compliance requirements. Remo Lipps P. Shakala Marlatt, MD 01/28/2016 11:17:38 AM This report has been signed electronically. Number of Addenda: 0

## 2016-01-28 NOTE — Progress Notes (Addendum)
[facility] PROGRESS NOTE  Kent Montgomery [id number] DOB: [birth year] DOA: 01/26/2016   PCP: Haywood Pao, MD     Assessment/Plan: Principal Problem:   Lower GI bleed Active Problems:   Symptomatic anemia   Bloody diarrhea   Sigmoid diverticulosis   History of colonic polyps   Hyperlipidemia   Acute GI bleeding   60 y.o. male with medical history significant for hyperlipidemia, allergic rhinitis, colonic polyps, and sigmoid diverticulosis who presents to the emergency department with bloody diarrhea of 2 days' duration. Patient reports pain in his usual state of health, having returned from a trip to [place] 12 days ago, in until the development of bloody diarrhea yesterday.He has had 2 colonoscopies, most recently in April 2015, notable for polyps and mild sigmoid diverticulosis. Hypotensive in the ER.Dr. Havery Moros of GI was consulted by the ED physician   Assessment and plan 1. Acute GI bleed with symptomatic anemia  - Presents to [facility] with 2 days of bloody diarrhea, ~  Overnight passed blood throughout prep - Initial Hgb 9.2; now hemoglobin is down to 6.8. Baseline hemoglobin 15.9 in 2012 , patient status post 3 units of packed red blood cells since admission anemia panel  consistent with iron deficiency Endoscopic evaluation today - Upper source possible as the patient takes aspirin on a regular basis, BUN is 25 and pt uses NSAID and ASA 81, not on any PPI at home -- GI considering infectious etiology as well, given his recent travel to [place], stool positive for enteropathogenic Escherichia coli Continue Protonix INR 1.28 Requester Dr Dr. Havery Moros of GI to evaluate, egd normal ,col showed Blood in the entire examined colon as outlined , majority in the left colon, Diverticulosis in the sigmoid colon.Tagged RBC scan or CT angiogram if evidence of  ongoing blood loss   2. Diarrhea, Enteropathogenic e coli - Infection considered, particularly given  recent travel to [place] the diarrhea is more likely secondary to EPEC, cannot r/o Gi bleed due to other causes   - Enteric precautions are in place and GI pathogen panel with O&P ordered  Discussed with Dr. Megan Salon, patient started on ciprofloxacin 500 by mouth twice a day GI consultation requested 8/4  3. Hyperlipidemia  - Continue Lovaza    4. Allergic rhinitis  - Stable  - Continue daily intranasal steroid and oral antihistamine      DVT prophylaxsis SCDs  Code Status:  Full code     Family Communication: Discussed in detail with the patient, all imaging results, lab results explained to the patient   Disposition Plan: Patient will continue to be monitored for his anemia     Consultants:  GI  Procedures:  None  Antibiotics: Anti-infectives    Start     Dose/Rate Route Frequency Ordered Stop   01/27/16 2000  [MAR Hold]  ciprofloxacin (CIPRO) tablet 500 mg     (MAR Hold since 01/28/16 0934)   500 mg Oral 2 times daily 01/27/16 1551           HPI/Subjective: Continues to have bright red blood per rectum with prep  overnight  Objective: Vitals:   01/28/16 0423 01/28/16 0455 01/28/16 0722 01/28/16 0957  BP: 107/60 (!) 93/53 98/61 124/72  Pulse: 89 89 79 86  Resp: 18 16 16  (!) 22  Temp: 98.4 F (36.9 C) 98.4 F (36.9 C) 98.2 F (36.8 C)   TempSrc: Oral Oral Oral   SpO2: 100% 99% 99% 100%  Weight:      Height:        Intake/Output Summary (Last 24 hours) at 01/28/16 0959 Last data filed at 01/28/16 0720  Gross per 24 hour  Intake             2499 ml  Output              900 ml  Net             1599 ml    Exam:  Examination:  General exam: Appears calm and comfortable  Respiratory system: Clear to auscultation. Respiratory effort normal. Cardiovascular system: S1 & S2 heard, RRR. No JVD, murmurs, rubs, gallops or clicks. No pedal edema. Gastrointestinal system: Abdomen is nondistended, soft and nontender. No  organomegaly or masses felt. Normal bowel sounds heard. Central nervous system: Alert and oriented. No focal neurological deficits. Extremities: Symmetric 5 x 5 power. Skin: No rashes, lesions or ulcers Psychiatry: Judgement and insight appear normal. Mood & affect appropriate.     Data Reviewed: I have personally reviewed following labs and imaging studies  Micro Results Recent Results (from the past 240 hour(s))  Gastrointestinal Panel by PCR , Stool     Status: Abnormal   Collection Time: 01/27/16  4:05 AM  Result Value Ref Range Status   Campylobacter species NOT DETECTED NOT DETECTED Final   Plesimonas shigelloides NOT DETECTED NOT DETECTED Final   Salmonella species NOT DETECTED NOT DETECTED Final   Yersinia enterocolitica NOT DETECTED NOT DETECTED Final   Vibrio species NOT DETECTED NOT DETECTED Final   Vibrio cholerae NOT DETECTED NOT DETECTED Final   Enteroaggregative E coli (EAEC) NOT DETECTED NOT DETECTED Final   Enteropathogenic E coli (EPEC) DETECTED (A) NOT DETECTED Final    Comment: SPOKE TO [place] 01/27/16 @ 1525  [place] E coli (ETEC) NOT DETECTED NOT DETECTED Final   Shiga like toxin producing E coli (STEC) NOT DETECTED NOT DETECTED Final   E. coli O157 NOT DETECTED NOT DETECTED Final   Shigella/Enteroinvasive E coli (EIEC) NOT DETECTED NOT DETECTED Final   Cryptosporidium NOT DETECTED NOT DETECTED Final   Cyclospora cayetanensis NOT DETECTED NOT DETECTED Final   Entamoeba histolytica NOT DETECTED NOT DETECTED Final   Giardia lamblia NOT DETECTED NOT DETECTED Final   Adenovirus F40/41 NOT DETECTED NOT DETECTED Final   Astrovirus NOT DETECTED NOT DETECTED Final   Norovirus GI/GII NOT DETECTED NOT DETECTED Final   Rotavirus A NOT DETECTED NOT DETECTED Final   Sapovirus (I, II, IV, and V) NOT DETECTED NOT DETECTED Final  C difficile quick scan w PCR reflex     Status: None   Collection Time: 01/27/16  9:56 AM  Result Value Ref Range Status    C Diff antigen NEGATIVE NEGATIVE Final   C Diff toxin NEGATIVE NEGATIVE Final   C Diff interpretation No C. difficile detected.  Final    Radiology Reports No results found.   CBC  Recent Labs Lab 01/26/16 1430 01/26/16 2215 01/27/16 0600 01/27/16 1811 01/27/16 2355  WBC 7.2  --   --   --   --   HGB 9.2* 7.9* 7.0* 7.4* 6.8*  HCT 27.0* 23.5* 20.6* 22.0* 20.5*  PLT 175  --   --   --   --   MCV 91.8  --   --   --   --   MCH 31.3  --   --   --   --   MCHC 34.1  --   --   --   --  RDW 12.5  --   --   --   --   LYMPHSABS 2.2  --   --   --   --   MONOABS 0.5  --   --   --   --   EOSABS 0.1  --   --   --   --   BASOSABS 0.0  --   --   --   --     Chemistries   Recent Labs Lab 01/26/16 1430 01/27/16 0600 01/28/16 0757  NA 136 137 138  K 3.7 4.0 3.6  CL 107 108 110  CO2 24 25 24   GLUCOSE 110* 108* 109*  BUN 25* 17 10  CREATININE 1.02 0.88 1.00  CALCIUM 8.0* 7.6* 7.4*  AST 19  --  16  ALT 36  --  25  ALKPHOS 58  --  42  BILITOT 0.5  --  0.9   ------------------------------------------------------------------------------------------------------------------ estimated creatinine clearance is 91.5 mL/min (by C-G formula based on SCr of 1 mg/dL). ------------------------------------------------------------------------------------------------------------------ No results for input(s): HGBA1C in the last 72 hours. ------------------------------------------------------------------------------------------------------------------ No results for input(s): CHOL, HDL, LDLCALC, TRIG, CHOLHDL, LDLDIRECT in the last 72 hours. ------------------------------------------------------------------------------------------------------------------ No results for input(s): TSH, T4TOTAL, T3FREE, THYROIDAB in the last 72 hours.  Invalid input(s): FREET3 ------------------------------------------------------------------------------------------------------------------  Recent Labs   01/26/16 2215 01/27/16 1003  VITAMINB12  --  175*  FOLATE  --  19.0  FERRITIN  --  5*  TIBC  --  290  IRON  --  17*  RETICCTPCT 2.3 3.2*    Coagulation profile  Recent Labs Lab 01/26/16 2215  INR 1.28    No results for input(s): DDIMER in the last 72 hours.  Cardiac Enzymes No results for input(s): CKMB, TROPONINI, MYOGLOBIN in the last 168 hours.  Invalid input(s): CK ------------------------------------------------------------------------------------------------------------------ Invalid input(s): POCBNP   CBG:  Recent Labs Lab 01/27/16 0754 01/28/16 0740  GLUCAP 110* 104*       Studies: No results found.    No results found for: HGBA1C Lab Results  Component Value Date   CREATININE 1.00 01/28/2016       Scheduled Meds: . [MAR Hold] ciprofloxacin  500 mg Oral BID  . [MAR Hold] fluticasone  2 spray Each Nare Daily  . [MAR Hold] loratadine  10 mg Oral Daily  . [MAR Hold] omega-3 acid ethyl esters  2 g Oral Daily  . [MAR Hold] pantoprazole  40 mg Intravenous Q12H  . [MAR Hold] sodium chloride flush  3 mL Intravenous Q12H   Continuous Infusions: . 0.9 % NaCl with KCl 20 mEq / L Stopped (01/28/16 0938)     LOS: 2 days    Time spent: >30 MINS    [facility] Pager [phone]. If 7PM-7AM, please contact night-coverage at [email or web address], password [facility] 01/28/2016, 9:59 AM  LOS: 2 days

## 2016-01-28 NOTE — H&P (View-Only) (Signed)
Patient ID: Kent Montgomery, male   DOB: 10-29-1955, 60 y.o.   MRN: [id number]    Progress Note   Subjective  Tolerated prep -says it was miserable..passed blood throughout prep HGB down to 6.8 today despite 2 units yesterday - transfused 2 more units and follow up hgb pending GI path panel returned  + for ecoli (EPEC)   Objective   Vital signs in last 24 hours: Temp:  [98.1 F (36.7 C)-98.6 F (37 C)] 98.2 F (36.8 C) (08/06 0722) Pulse Rate:  [79-95] 79 (08/06 0722) Resp:  [16-20] 16 (08/06 0722) BP: (93-132)/(53-70) 98/61 (08/06 0722) SpO2:  [99 %-100 %] 99 % (08/06 0722) Last BM Date: 01/26/16 General:    white male in NAD Heart:  Regular rate and rhythm; no murmurs Lungs: Respirations even and unlabored, lungs CTA bilaterally Abdomen:  Soft, nontender and nondistended. Normal bowel sounds. Extremities:  Without edema. Neurologic:  Alert and oriented,  grossly normal neurologically. Psych:  Cooperative. Normal mood and affect.  Intake/Output from previous day: 08/05 0701 - 08/06 0700 In: 2179 [P.O.:720; I.V.:840; Blood:619] Out: 900 [Urine:900] Intake/Output this shift: Total I/O In: 320 [Blood:320] Out: -   Lab Results:  Recent Labs  01/26/16 1430  01/27/16 0600 01/27/16 1811 01/27/16 2355  WBC 7.2  --   --   --   --   HGB 9.2*  < > 7.0* 7.4* 6.8*  HCT 27.0*  < > 20.6* 22.0* 20.5*  PLT 175  --   --   --   --   < > = values in this interval not displayed. BMET  Recent Labs  01/26/16 1430 01/27/16 0600  NA 136 137  K 3.7 4.0  CL 107 108  CO2 24 25  GLUCOSE 110* 108*  BUN 25* 17  CREATININE 1.02 0.88  CALCIUM 8.0* 7.6*   LFT  Recent Labs  01/26/16 1430  PROT 5.6*  ALBUMIN 3.6  AST 19  ALT 36  ALKPHOS 58  BILITOT 0.5   PT/INR  Recent Labs  01/26/16 2215  LABPROT 16.1*  INR 1.28        Assessment / Plan:    #1 60 yo WM with acute GI bleed- probably lower ?diverticular- continued to bleed with prep - has had 4 units PRBC's - hgb   Pending this am #2  EColi (EPEC)  + -cipro started - Ecoli does not typically cause Major   GI Bleeding so likely has 2 separate issues  #3 iron deficiency anemia #4 Hx multiple adenomatous colon polyps  Plan; Continue serial hgb's- keep hgb 8  Continue Cipro for now For Colon /EGD this am -further plans pending findings  Principal Problem:   Lower GI bleed Active Problems:   Symptomatic anemia   Bloody diarrhea   Sigmoid diverticulosis   History of colonic polyps   Hyperlipidemia   Acute GI bleeding     LOS: 2 days   Amy Esterwood  01/28/2016, 8:32 AM

## 2016-01-28 NOTE — Progress Notes (Signed)
CRITICAL VALUE ALERT  Critical value received:  Hbg 6.8  Date of notification:  01/28/16  Time of notification:  0119  Critical value read back:Yes.    Nurse who received alert:  Candise Bowens, RN   MD notified (1st page):  Georges Mouse  Time of first page:  0125  MD notified (2nd page):  Time of second page:  Responding MD:  Georges Mouse  Time MD responded:  [id number]  Shelbie Hutching, RN, BSN

## 2016-01-28 NOTE — Op Note (Signed)
[facility] Patient Name: Kent Montgomery Procedure Date : 01/28/2016 MRN: [id number] Attending MD: Carlota Raspberry. Havery Moros , MD Date of Birth: Dec 17, 1955 CSN: [id number] Age: 60 Admit Type: Inpatient Procedure:                Colonoscopy Indications:              Evaluation of unexplained GI bleeding, Unexplained                            iron deficiency anemia, E coli (EPEC) infection Providers:                Carlota Raspberry. Havery Moros, MD, Kingsley Plan, RN,                            Corliss Parish, Technician Referring MD:              Medicines:                Fentanyl 50 micrograms IV, Midazolam 2 mg IV Complications:            No immediate complications. Estimated blood loss:                            None. Estimated Blood Loss:     Estimated blood loss was minimal. Estimated blood                            loss: none. Procedure:                Pre-Anesthesia Assessment:                           - Prior to the procedure, a History and Physical                            was performed, and patient medications and                            allergies were reviewed. The patient's tolerance of                            previous anesthesia was also reviewed. The risks                            and benefits of the procedure and the sedation                            options and risks were discussed with the patient.                            All questions were answered, and informed consent                            was obtained. Prior Anticoagulants: The patient has  taken no previous anticoagulant or antiplatelet                            agents. ASA Grade Assessment: III - A patient with                            severe systemic disease. After reviewing the risks                            and benefits, the patient was deemed in                            satisfactory condition to undergo the procedure.                           After  obtaining informed consent, the colonoscope                            was passed under direct vision. Throughout the                            procedure, the patient's blood pressure, pulse, and                            oxygen saturations were monitored continuously. The                            [id number] scope was introduced through                            the anus and advanced to the the terminal ileum,                            with identification of the appendiceal orifice and                            IC valve. The colonoscopy was performed without                            difficulty. The patient tolerated the procedure                            well. The quality of the bowel preparation was                            fair. The terminal ileum, ileocecal valve,                            appendiceal orifice, and rectum were photographed. Scope In: 10:36:40 AM Scope Out: 11:07:53 AM Scope Withdrawal Time: 0 hours 23 minutes 47 seconds  Total Procedure Duration: 0 hours 31 minutes 13 seconds  Findings:      The perianal and digital rectal examinations were normal.      Dark to maroon colored blood  was found in the entire colon, although       mostly in the left colon and very mild in the transverse and right       colon. The prep was only fair and inadequate for screening purposes.       Extensive lavage was performed and no active bleeding was appreciated.      A few small-mouthed diverticula were found in the sigmoid colon.      The terminal ileum contained some darkish secretions with some blood       tinge to it, unclear if this represented backwash from the colon versus       blood from more proximal source.      Non-bleeding internal hemorrhoids were found during retroflexion.      The exam was otherwise normal throughout the examined colon without mass       lesions or AVMs. The mucosa appeared normal without evidence of colitis,       in regards to EPEC  infection. Impression:               - Preparation of the colon was fair.                           - Blood in the entire examined colon as outlined                            above, majority in the left colon                           - Diverticulosis in the sigmoid colon.                           - Darkish secretions with ? blood tinge in the                            terminal ileum - unclear if backwash from colon                            versus bleeding from more proximal small bowel                           - Non-bleeding internal hemorrhoids.                           Overall, following EGD and colonoscopy,                            differential includes diverticular colonic bleeding                            vs. more promixal small bowel source. Moderate Sedation:      Moderate (conscious) sedation was administered by the endoscopy nurse       and supervised by the endoscopist. The following parameters were       monitored: oxygen saturation, heart rate, blood pressure, and response       to care. Total physician intraservice time was 36 minutes. Recommendation:           -  Return patient to hospital ward for ongoing care.                           - Clear liquid diet.                           - Continue present medications.                           - Repeat Hgb now post transfusion                           - Tagged RBC scan or CT angiogram if evidence of                            ongoing blood loss                           - GI Service will continue to follow                           - Repeat colonoscopy for surveillance. Procedure Code(s):        --- Professional ---                           [phone], Colonoscopy, flexible; diagnostic, including                            collection of specimen(s) by brushing or washing,                            when performed (separate procedure)                           99152, Moderate sedation services provided by the                             same physician or other qualified health care                            professional performing the diagnostic or                            therapeutic service that the sedation supports,                            requiring the presence of an independent trained                            observer to assist in the monitoring of the                            patient's level of consciousness and physiological  status; initial 15 minutes of intraservice time,                            patient age 44 years or older                           [phone], Moderate sedation services; each additional                            15 minutes intraservice time Diagnosis Code(s):        --- Professional ---                           K64.8, Other hemorrhoids                           K92.2, Gastrointestinal hemorrhage, unspecified                           D50.9, Iron deficiency anemia, unspecified                           K57.30, Diverticulosis of large intestine without                            perforation or abscess without bleeding CPT copyright 2016 [employer]. All rights reserved. The codes documented in this report are preliminary and upon coder review may  be revised to meet current compliance requirements. Remo Lipps P. Armbruster, MD 01/28/2016 11:32:47 AM This report has been signed electronically. Number of Addenda: 0

## 2016-01-28 NOTE — Interval H&P Note (Signed)
History and Physical Interval Note:  01/28/2016 10:25 AM  Kent Montgomery  has presented today for surgery, with the diagnosis of GI bleed  The various methods of treatment have been discussed with the patient and family. After consideration of risks, benefits and other options for treatment, the patient has consented to  Procedure(s): COLONOSCOPY (N/A) ESOPHAGOGASTRODUODENOSCOPY (EGD) (N/A) as a surgical intervention .  The patient's history has been reviewed, patient examined, no change in status, stable for surgery.  I have reviewed the patient's chart and labs.  Questions were answered to the patient's satisfaction.     Renelda Loma Armbruster

## 2016-01-28 NOTE — Progress Notes (Signed)
Hgb 7.5 at 12:07.  Dr. Allyson Sabal notified.

## 2016-01-28 NOTE — Progress Notes (Signed)
Patient ID: Kent Montgomery, male   DOB: Sep 01, 1955, 60 y.o.   MRN: [id number]    Progress Note   Subjective  Tolerated prep -says it was miserable..passed blood throughout prep HGB down to 6.8 today despite 2 units yesterday - transfused 2 more units and follow up hgb pending GI path panel returned  + for ecoli (EPEC)   Objective   Vital signs in last 24 hours: Temp:  [98.1 F (36.7 C)-98.6 F (37 C)] 98.2 F (36.8 C) (08/06 0722) Pulse Rate:  [79-95] 79 (08/06 0722) Resp:  [16-20] 16 (08/06 0722) BP: (93-132)/(53-70) 98/61 (08/06 0722) SpO2:  [99 %-100 %] 99 % (08/06 0722) Last BM Date: 01/26/16 General:    white male in NAD Heart:  Regular rate and rhythm; no murmurs Lungs: Respirations even and unlabored, lungs CTA bilaterally Abdomen:  Soft, nontender and nondistended. Normal bowel sounds. Extremities:  Without edema. Neurologic:  Alert and oriented,  grossly normal neurologically. Psych:  Cooperative. Normal mood and affect.  Intake/Output from previous day: 08/05 0701 - 08/06 0700 In: 2179 [P.O.:720; I.V.:840; Blood:619] Out: 900 [Urine:900] Intake/Output this shift: Total I/O In: 320 [Blood:320] Out: -   Lab Results:  Recent Labs  01/26/16 1430  01/27/16 0600 01/27/16 1811 01/27/16 2355  WBC 7.2  --   --   --   --   HGB 9.2*  < > 7.0* 7.4* 6.8*  HCT 27.0*  < > 20.6* 22.0* 20.5*  PLT 175  --   --   --   --   < > = values in this interval not displayed. BMET  Recent Labs  01/26/16 1430 01/27/16 0600  NA 136 137  K 3.7 4.0  CL 107 108  CO2 24 25  GLUCOSE 110* 108*  BUN 25* 17  CREATININE 1.02 0.88  CALCIUM 8.0* 7.6*   LFT  Recent Labs  01/26/16 1430  PROT 5.6*  ALBUMIN 3.6  AST 19  ALT 36  ALKPHOS 58  BILITOT 0.5   PT/INR  Recent Labs  01/26/16 2215  LABPROT 16.1*  INR 1.28        Assessment / Plan:    #1 60 yo WM with acute GI bleed- probably lower ?diverticular- continued to bleed with prep - has had 4 units PRBC's - hgb   Pending this am #2  EColi (EPEC)  + -cipro started - Ecoli does not typically cause Major   GI Bleeding so likely has 2 separate issues  #3 iron deficiency anemia #4 Hx multiple adenomatous colon polyps  Plan; Continue serial hgb's- keep hgb 8  Continue Cipro for now For Colon /EGD this am -further plans pending findings  Principal Problem:   Lower GI bleed Active Problems:   Symptomatic anemia   Bloody diarrhea   Sigmoid diverticulosis   History of colonic polyps   Hyperlipidemia   Acute GI bleeding     LOS: 2 days   Kent Montgomery  01/28/2016, 8:32 AM

## 2016-01-29 DIAGNOSIS — A09 Infectious gastroenteritis and colitis, unspecified: Secondary | ICD-10-CM

## 2016-01-29 DIAGNOSIS — Z86010 Personal history of colonic polyps: Secondary | ICD-10-CM

## 2016-01-29 DIAGNOSIS — D509 Iron deficiency anemia, unspecified: Secondary | ICD-10-CM

## 2016-01-29 DIAGNOSIS — K921 Melena: Principal | ICD-10-CM

## 2016-01-29 DIAGNOSIS — E785 Hyperlipidemia, unspecified: Secondary | ICD-10-CM

## 2016-01-29 DIAGNOSIS — A040 Enteropathogenic Escherichia coli infection: Secondary | ICD-10-CM

## 2016-01-29 LAB — COMPREHENSIVE METABOLIC PANEL
ALBUMIN: 2.9 g/dL — AB (ref 3.5–5.0)
ALK PHOS: 41 U/L (ref 38–126)
ALT: 23 U/L (ref 17–63)
AST: 16 U/L (ref 15–41)
Anion gap: 4 — ABNORMAL LOW (ref 5–15)
BUN: 7 mg/dL (ref 6–20)
CALCIUM: 7.8 mg/dL — AB (ref 8.9–10.3)
CO2: 25 mmol/L (ref 22–32)
CREATININE: 1.06 mg/dL (ref 0.61–1.24)
Chloride: 110 mmol/L (ref 101–111)
GFR calc Af Amer: >60 mL/min (ref >60)
GFR calc non Af Amer: >60 mL/min (ref >60)
GLUCOSE: 106 mg/dL — AB (ref 65–99)
Potassium: 3.8 mmol/L (ref 3.5–5.1)
SODIUM: 139 mmol/L (ref 135–145)
Total Bilirubin: 0.6 mg/dL (ref 0.3–1.2)
Total Protein: 4.3 g/dL — ABNORMAL LOW (ref 6.5–8.1)

## 2016-01-29 LAB — CBC
HCT: 26 % — ABNORMAL LOW (ref 39.0–52.0)
Hemoglobin: 8.5 g/dL — ABNORMAL LOW (ref 13.0–17.0)
MCH: 28.9 pg (ref 26.0–34.0)
MCHC: 32.7 g/dL (ref 30.0–36.0)
MCV: 88.4 fL (ref 78.0–100.0)
Platelets: 171 10*3/uL (ref 150–400)
RBC: 2.94 MIL/uL — ABNORMAL LOW (ref 4.22–5.81)
RDW: 15.6 % — AB (ref 11.5–15.5)
WBC: 5.5 10*3/uL (ref 4.0–10.5)

## 2016-01-29 LAB — CBC WITH DIFFERENTIAL/PLATELET
Basophils Absolute: 0 10*3/uL (ref 0.0–0.1)
Basophils Relative: 0 %
EOS ABS: 0.1 10*3/uL (ref 0.0–0.7)
Eosinophils Relative: 2 %
HEMATOCRIT: 22.9 % — AB (ref 39.0–52.0)
HEMOGLOBIN: 7.5 g/dL — AB (ref 13.0–17.0)
LYMPHS ABS: 1.5 10*3/uL (ref 0.7–4.0)
Lymphocytes Relative: 33 %
MCH: 29 pg (ref 26.0–34.0)
MCHC: 32.8 g/dL (ref 30.0–36.0)
MCV: 88.4 fL (ref 78.0–100.0)
MONO ABS: 0.3 10*3/uL (ref 0.1–1.0)
MONOS PCT: 7 %
NEUTROS PCT: 58 %
Neutro Abs: 2.6 10*3/uL (ref 1.7–7.7)
Platelets: 157 10*3/uL (ref 150–400)
RBC: 2.59 MIL/uL — ABNORMAL LOW (ref 4.22–5.81)
RDW: 16.3 % — ABNORMAL HIGH (ref 11.5–15.5)
WBC: 4.5 10*3/uL (ref 4.0–10.5)

## 2016-01-29 LAB — GLUCOSE, CAPILLARY: Glucose-Capillary: 104 mg/dL — ABNORMAL HIGH (ref 65–99)

## 2016-01-29 LAB — PREPARE RBC (CROSSMATCH)

## 2016-01-29 MED ORDER — SODIUM CHLORIDE 0.9 % IV SOLN: Freq: Once | INTRAVENOUS | Status: DC

## 2016-01-29 MED ORDER — CIPROFLOXACIN HCL 500 MG PO TABS
500.0000 mg | ORAL_TABLET | Freq: Two times a day (BID) | ORAL | Status: DC
  Administered 2016-01-29 – 2016-01-30 (×2): 500 mg via ORAL
  Filled 2016-01-29 (×2): qty 1

## 2016-01-29 NOTE — Consult Note (Signed)
   [facility] CM Inpatient Consult   01/29/2016  ZANIEL KOSAR January 14, 1956 [id number]   Came to visit Mr. Christopherson at bedside on behalf of Norm Parcel to [employer] for [employer] with [employer]. Mr. Legleiter currently denies any needs for the [facility] program. However, he is agreeable to post discharge follow up call. Confirmed best contact number as [phone]. Link to [employer] and contact information provided.   Marthenia Rolling, MSN-Ed, RN,BSN [facility] Liaison [phone]

## 2016-01-29 NOTE — Consult Note (Signed)
Date of Admission:  01/26/2016  Date of Consult:  01/29/2016  Reason for Consult: Enteropathogenic E. Coli isolated on GI pathogen panel in patient with bloody diarrhea after recent trip to [place]  Referring Physician: Dr. Allyson Sabal   HPI: Kent Montgomery is an 60 y.o. male who was admitted via ED with rectal bleeding, diarrhea, lightheadeness and diarrhea with 10-12 bm per day since Thursday of 01/25/16. He has had no fevers of vomiting. He had been in [place] approximately 12 days prior but not felt ill until he came back. Stool GI pathogen panel + EPEC. Patient was started on ciprofloxacin. Patient has had EGD and colonoscopy with the latter showing diverticula and blood. Patient is being thoroughly worked up by GI who believe the patients anemia with very low ferritin (5) are consistent with more of a chronic anemia along with recent acute GIB.      Past Medical History:  Diagnosis Date  . Allergy   . Colon polyp   . GERD (gastroesophageal reflux disease)   . Hyperlipidemia   . Skin cancer    basil cell rt arm    Past Surgical History:  Procedure Laterality Date  . APPENDECTOMY    . BACK SURGERY    . COLONOSCOPY    . COLONOSCOPY N/A 01/28/2016   Procedure: COLONOSCOPY;  Surgeon: Manus Gunning, MD;  Location: [facility] ENDOSCOPY;  Service: Gastroenterology;  Laterality: N/A;  . ELBOW ARTHROSCOPY    . ESOPHAGOGASTRODUODENOSCOPY N/A 01/28/2016   Procedure: ESOPHAGOGASTRODUODENOSCOPY (EGD);  Surgeon: Manus Gunning, MD;  Location: [facility];  Service: Gastroenterology;  Laterality: N/A;    Social History:  reports that he has never smoked. He has never used smokeless tobacco. He reports that he drinks about 6.0 oz of alcohol per week . He reports that he does not use drugs.   Family History  Problem Relation Age of Onset  . Cancer Maternal Grandfather     bladder  . Prostate cancer Father   . Colon polyps Father   . Heart disease Father   .  Nephrolithiasis Father   . Stomach cancer Father   . Colon cancer Neg Hx   . Esophageal cancer Neg Hx   . Rectal cancer Neg Hx     Allergies  Allergen Reactions  . Droperidol Anaphylaxis  . Cefuroxime Axetil Rash     Medications: I have reviewed patients current medications as documented in Epic Anti-infectives    Start     Dose/Rate Route Frequency Ordered Stop   01/29/16 2000  ciprofloxacin (CIPRO) tablet 500 mg     500 mg Oral 2 times daily 01/29/16 1215     01/27/16 2000  ciprofloxacin (CIPRO) tablet 500 mg  Status:  Discontinued     500 mg Oral 2 times daily 01/27/16 1551 01/29/16 1215         ROS: as in HPI otherwise remainder of 12 point Review of Systems is negative\  Blood pressure 122/65, pulse 90, temperature 98.4 F (36.9 C), temperature source Oral, resp. rate 19, height '5\' 9"'  (1.753 m), weight 222 lb 1.6 oz (100.7 kg), SpO2 100 %. General: Alert and awake, oriented x3, not in any acute distress. HEENT: anicteric sclera,  EOMI, oropharynx clear and without exudate Cardiovascular: egular rate, normal r,  no murmur rubs or gallops Pulmonary: clear to auscultation bilaterally, no wheezing, rales or rhonchi Gastrointestinal: soft not especially tendernondistended, normal bowel sounds, Musculoskeletal: no  clubbing or edema noted bilaterally Skin, soft  tissue: no rashes Neuro: nonfocal, strength and sensation intact   Results for orders placed or performed during the hospital encounter of 01/26/16 (from the past 48 hour(s))  Hemoglobin and hematocrit, blood     Status: Abnormal   Collection Time: 01/27/16 11:55 PM  Result Value Ref Range   Hemoglobin 6.8 (LL) 13.0 - 17.0 g/dL    Comment: REPEATED TO VERIFY CRITICAL RESULT CALLED TO, READ BACK BY AND VERIFIED WITH: T.ISSAACS,RN 0122 01/28/16 M.CAMPBELL    HCT 20.5 (L) 39.0 - 52.0 %  Prepare RBC     Status: None   Collection Time: 01/28/16  1:27 AM  Result Value Ref Range   Order Confirmation ORDER  PROCESSED BY BLOOD BANK   Glucose, capillary     Status: Abnormal   Collection Time: 01/28/16  7:40 AM  Result Value Ref Range   Glucose-Capillary 104 (H) 65 - 99 mg/dL  Comprehensive metabolic panel     Status: Abnormal   Collection Time: 01/28/16  7:57 AM  Result Value Ref Range   Sodium 138 135 - 145 mmol/L   Potassium 3.6 3.5 - 5.1 mmol/L   Chloride 110 101 - 111 mmol/L   CO2 24 22 - 32 mmol/L   Glucose, Bld 109 (H) 65 - 99 mg/dL   BUN 10 6 - 20 mg/dL   Creatinine, Ser 1.00 0.61 - 1.24 mg/dL   Calcium 7.4 (L) 8.9 - 10.3 mg/dL   Total Protein 4.2 (L) 6.5 - 8.1 g/dL   Albumin 2.7 (L) 3.5 - 5.0 g/dL   AST 16 15 - 41 U/L   ALT 25 17 - 63 U/L   Alkaline Phosphatase 42 38 - 126 U/L   Total Bilirubin 0.9 0.3 - 1.2 mg/dL   GFR calc non Af Amer >60 >60 mL/min   GFR calc Af Amer >60 >60 mL/min    Comment: (NOTE) The eGFR has been calculated using the CKD EPI equation. This calculation has not been validated in all clinical situations. eGFR's persistently <60 mL/min signify possible Chronic Kidney Disease.    Anion gap 4 (L) 5 - 15  CBC     Status: Abnormal   Collection Time: 01/28/16 12:07 PM  Result Value Ref Range   WBC 5.1 4.0 - 10.5 K/uL   RBC 2.52 (L) 4.22 - 5.81 MIL/uL   Hemoglobin 7.5 (L) 13.0 - 17.0 g/dL   HCT 22.0 (L) 39.0 - 52.0 %   MCV 87.3 78.0 - 100.0 fL   MCH 29.8 26.0 - 34.0 pg   MCHC 34.1 30.0 - 36.0 g/dL   RDW 16.0 (H) 11.5 - 15.5 %   Platelets 118 (L) 150 - 400 K/uL    Comment: PLATELET COUNT CONFIRMED BY SMEAR  Comprehensive metabolic panel     Status: Abnormal   Collection Time: 01/28/16  2:53 PM  Result Value Ref Range   Sodium 134 (L) 135 - 145 mmol/L   Potassium 3.5 3.5 - 5.1 mmol/L   Chloride 108 101 - 111 mmol/L   CO2 24 22 - 32 mmol/L   Glucose, Bld 79 65 - 99 mg/dL   BUN 9 6 - 20 mg/dL   Creatinine, Ser 1.03 0.61 - 1.24 mg/dL   Calcium 7.2 (L) 8.9 - 10.3 mg/dL   Total Protein 4.5 (L) 6.5 - 8.1 g/dL   Albumin 2.9 (L) 3.5 - 5.0 g/dL   AST 18  15 - 41 U/L   ALT 24 17 - 63 U/L   Alkaline Phosphatase 42  38 - 126 U/L   Total Bilirubin 0.5 0.3 - 1.2 mg/dL   GFR calc non Af Amer >60 >60 mL/min   GFR calc Af Amer >60 >60 mL/min    Comment: (NOTE) The eGFR has been calculated using the CKD EPI equation. This calculation has not been validated in all clinical situations. eGFR's persistently <60 mL/min signify possible Chronic Kidney Disease.    Anion gap 2 (L) 5 - 15    Comment: REPEATED TO VERIFY  CBC with Differential/Platelet     Status: Abnormal   Collection Time: 01/29/16  4:54 AM  Result Value Ref Range   WBC 4.5 4.0 - 10.5 K/uL   RBC 2.59 (L) 4.22 - 5.81 MIL/uL   Hemoglobin 7.5 (L) 13.0 - 17.0 g/dL   HCT 22.9 (L) 39.0 - 52.0 %   MCV 88.4 78.0 - 100.0 fL   MCH 29.0 26.0 - 34.0 pg   MCHC 32.8 30.0 - 36.0 g/dL   RDW 16.3 (H) 11.5 - 15.5 %   Platelets 157 150 - 400 K/uL   Neutrophils Relative % 58 %   Neutro Abs 2.6 1.7 - 7.7 K/uL   Lymphocytes Relative 33 %   Lymphs Abs 1.5 0.7 - 4.0 K/uL   Monocytes Relative 7 %   Monocytes Absolute 0.3 0.1 - 1.0 K/uL   Eosinophils Relative 2 %   Eosinophils Absolute 0.1 0.0 - 0.7 K/uL   Basophils Relative 0 %   Basophils Absolute 0.0 0.0 - 0.1 K/uL  Comprehensive metabolic panel     Status: Abnormal   Collection Time: 01/29/16  4:54 AM  Result Value Ref Range   Sodium 139 135 - 145 mmol/L   Potassium 3.8 3.5 - 5.1 mmol/L   Chloride 110 101 - 111 mmol/L   CO2 25 22 - 32 mmol/L   Glucose, Bld 106 (H) 65 - 99 mg/dL   BUN 7 6 - 20 mg/dL   Creatinine, Ser 1.06 0.61 - 1.24 mg/dL   Calcium 7.8 (L) 8.9 - 10.3 mg/dL   Total Protein 4.3 (L) 6.5 - 8.1 g/dL   Albumin 2.9 (L) 3.5 - 5.0 g/dL   AST 16 15 - 41 U/L   ALT 23 17 - 63 U/L   Alkaline Phosphatase 41 38 - 126 U/L   Total Bilirubin 0.6 0.3 - 1.2 mg/dL   GFR calc non Af Amer >60 >60 mL/min   GFR calc Af Amer >60 >60 mL/min    Comment: (NOTE) The eGFR has been calculated using the CKD EPI equation. This calculation has not  been validated in all clinical situations. eGFR's persistently <60 mL/min signify possible Chronic Kidney Disease.    Anion gap 4 (L) 5 - 15  Glucose, capillary     Status: Abnormal   Collection Time: 01/29/16  8:13 AM  Result Value Ref Range   Glucose-Capillary 104 (H) 65 - 99 mg/dL   Comment 1 Notify RN    Comment 2 Document in Chart   Prepare RBC     Status: None   Collection Time: 01/29/16  8:19 AM  Result Value Ref Range   Order Confirmation ORDER PROCESSED BY BLOOD BANK   CBC     Status: Abnormal   Collection Time: 01/29/16  3:16 PM  Result Value Ref Range   WBC 5.5 4.0 - 10.5 K/uL   RBC 2.94 (L) 4.22 - 5.81 MIL/uL   Hemoglobin 8.5 (L) 13.0 - 17.0 g/dL   HCT 26.0 (L) 39.0 - 52.0 %  MCV 88.4 78.0 - 100.0 fL   MCH 28.9 26.0 - 34.0 pg   MCHC 32.7 30.0 - 36.0 g/dL   RDW 15.6 (H) 11.5 - 15.5 %   Platelets 171 150 - 400 K/uL   '@BRIEFLABTABLE' (sdes,specrequest,cult,reptstatus)   ) Recent Results (from the past 720 hour(s))  Gastrointestinal Panel by PCR , Stool     Status: Abnormal   Collection Time: 01/27/16  4:05 AM  Result Value Ref Range Status   Campylobacter species NOT DETECTED NOT DETECTED Final   Plesimonas shigelloides NOT DETECTED NOT DETECTED Final   Salmonella species NOT DETECTED NOT DETECTED Final   Yersinia enterocolitica NOT DETECTED NOT DETECTED Final   Vibrio species NOT DETECTED NOT DETECTED Final   Vibrio cholerae NOT DETECTED NOT DETECTED Final   Enteroaggregative E coli (EAEC) NOT DETECTED NOT DETECTED Final   Enteropathogenic E coli (EPEC) DETECTED (A) NOT DETECTED Final    Comment: SPOKE TO REBECCA GREENWALD 01/27/16 @ 1525  MLK   Enterotoxigenic E coli (ETEC) NOT DETECTED NOT DETECTED Final   Shiga like toxin producing E coli (STEC) NOT DETECTED NOT DETECTED Final   E. coli O157 NOT DETECTED NOT DETECTED Final   Shigella/Enteroinvasive E coli (EIEC) NOT DETECTED NOT DETECTED Final   Cryptosporidium NOT DETECTED NOT DETECTED Final   Cyclospora  cayetanensis NOT DETECTED NOT DETECTED Final   Entamoeba histolytica NOT DETECTED NOT DETECTED Final   Giardia lamblia NOT DETECTED NOT DETECTED Final   Adenovirus F40/41 NOT DETECTED NOT DETECTED Final   Astrovirus NOT DETECTED NOT DETECTED Final   Norovirus GI/GII NOT DETECTED NOT DETECTED Final   Rotavirus A NOT DETECTED NOT DETECTED Final   Sapovirus (I, II, IV, and V) NOT DETECTED NOT DETECTED Final  C difficile quick scan w PCR reflex     Status: None   Collection Time: 01/27/16  9:56 AM  Result Value Ref Range Status   C Diff antigen NEGATIVE NEGATIVE Final   C Diff toxin NEGATIVE NEGATIVE Final   C Diff interpretation No C. difficile detected.  Final     Impression/Recommendation  Principal Problem:   Lower GI bleed Active Problems:   Symptomatic anemia   Bloody diarrhea   Sigmoid diverticulosis   History of colonic polyps   Hyperlipidemia   Acute GI bleeding   Anemia, iron deficiency   Kent Montgomery is a 60 y.o. male with  Admission with GIB, bloody diarrhrea , severe iron deficiency anemia with EPEC isolated on GI pathogen panel, blood found in colon on endoscopy along with diverticula  #1 EPEC:   Not clear that this is truly a pathogen or simply a colonizer found in someone having a diverticular bleed. Yes the patient had diarrhea but bleeding can itself ppt this. The hx of travel to [place] would make one concerned about return with a shiga producing GI pathogen  Fortunately the patient does not have E coli 0157, shigella or shiga toxin producing organism etc  I would finish total of 6 doses of the ciprofloxain  #2 Screening: check for HIV, hep A, B and C infection/immunity  I will otherwise sign off please call with further questions.    01/29/2016, 8:25 PM   Thank you so much for this interesting consult  [facility] [place] [phone] (pager) [phone] (office) 01/29/2016, 8:25 PM  Rhina Brackett  [place] 01/29/2016, 8:25 PM

## 2016-01-29 NOTE — Progress Notes (Signed)
Daily Rounding Note  01/29/2016, 12:03 PM  LOS: 3 days   SUBJECTIVE:   Chief complaint:  Bloody stool  Stools today still watery, at first darker and some rusty areas, latest watery more brown but still rusty.  No nausea or abd pain on clears.  Hungry.   OBJECTIVE:         Vital signs in last 24 hours:    Temp:  [98.1 F (36.7 C)-98.8 F (37.1 C)] 98.2 F (36.8 C) (08/07 1004) Pulse Rate:  [73-89] 87 (08/07 1004) Resp:  [16-20] 20 (08/07 1004) BP: (97-137)/(58-67) 97/62 (08/07 1004) SpO2:  [99 %-100 %] 99 % (08/07 1004) Weight:  [100.7 kg (222 lb 1.6 oz)] 100.7 kg (222 lb 1.6 oz) (08/06 2007) Last BM Date: 01/28/16 Filed Weights   01/26/16 1354 01/26/16 2113 01/28/16 2007  Weight: 92.5 kg (204 lb) 94.8 kg (208 lb 15.9 oz) 100.7 kg (222 lb 1.6 oz)   General: looks well   Heart: RRR Chest: clear bil.  Abdomen: soft, NT, ND.  Active BS  Extremities: no CCE Neuro/Psych:  No gross deficits.  A bit anxious.   Intake/Ouput from previous day: 08/06 0701 - 08/07 0700 In: 3335 [P.O.:1080; I.V.:1935; Blood:320] Out: 3000 [Urine:3000]   Lab Results:  CBC Latest Ref Rng & Units 01/29/2016 01/29/2016 01/28/2016  WBC 4.0 - 10.5 K/uL 5.5 4.5 5.1  Hemoglobin 13.0 - 17.0 g/dL 8.5(L) 7.5(L) 7.5(L)  Hematocrit 39.0 - 52.0 % 26.0(L) 22.9(L) 22.0(L)  Platelets 150 - 400 K/uL 171 157 118(L)     ASSESMENT:   *  GI bleeding and IDA.  Differential includes diverticular colonic bleeding vs. more promixal small bowel source.  EGD 8/6: normal.  Colonoscopy 8/6: blood present, mostly in left side.  Sigmoid tics.  Darkish secretions with ? blood tinege in terminal ileum - unclear if backwash from colon versus bleeding from more proximal small bowel,   No recurrence of adenomatous colon polyps as found on 09/2013 colonoscopy.   Enteropathogennic E coli per stool study, cipro po day 2.   *  Blood loss anemia, normocytic but ferritin 6, c/w  IDA.  Ferric gluconate IV on 8/6.  S/p PRBCs x 4, last was completed at noon today, and has not had post transfusion CBC.    *  Thrombocytopenia to 118, resolved.   *  Elevated LFts, Fatty liver per ultrasound 2012. Liver biopsy 10/2010: benign, no steatosis, fibrosis, iron deposition.     PLAN   *  If ongoing or recurrent active bleeding: pursue tagged RBC scan, CT angiogram.  Other consideration would be for capsule endoscopy. However will defer these decisions to Dr Carlean Purl.  Right now do not think bleeding is vigourous enough for a + bleeding scan.    *  Stopped Protonix, not on PPI PTA,     *  Pt and his wife have many questions.  Wonder why he is not going to have capsule study or bleeding scan, wondering if we should call hematologist.   *  Advance to full liquids.    *  CBC now and again in AM.   Addendum at 4:40 PM: the follow up Hgb is 8.5     Azucena Freed  01/29/2016, 12:03 PM Pager: [place] Attending   I have taken an interval history, reviewed the chart and examined the patient. I agree with the Advanced Practitioner's note, impression and recommendations.  I have decided to schedule capsule endoscopy of small bowel because he has iron-deficiency anemia and do not think that his acute bleeding should have caused that. Have reviewed risks and benefits and he understands and agrees to proceed. Back to clears then NPO in AM  Gatha Mayer, MD, [facility] [phone] (pager) [phone] after 5 PM, weekends and holidays  01/29/2016 4:49 PM

## 2016-01-29 NOTE — Progress Notes (Addendum)
[facility] PROGRESS NOTE  Kent Montgomery [id number] DOB: [birth year] DOA: 01/26/2016   PCP: Haywood Pao, MD     Assessment/Plan: Principal Problem:   Lower GI bleed Active Problems:   Symptomatic anemia   Bloody diarrhea   Sigmoid diverticulosis   History of colonic polyps   Hyperlipidemia   Acute GI bleeding   60 y.o. male with medical history significant for hyperlipidemia, allergic rhinitis, colonic polyps, and sigmoid diverticulosis who presents to the emergency department with bloody diarrhea of 2 days' duration. Patient reports pain in his usual state of health, having returned from a trip to [place] 12 days ago, in until the development of bloody diarrhea yesterday.He has had 2 colonoscopies, most recently in April 2015, notable for polyps and mild sigmoid diverticulosis. Hypotensive in the ER.Dr. Havery Moros of GI was consulted by the ED physician   Assessment and plan 1. Acute GI bleed with symptomatic anemia  - Presents to [facility] with   bloody diarrhea,   - Initial Hgb 9.2; now hemoglobin is down to 6.8. Baseline hemoglobin 15.9 in 2012 , patient status post 3 units of packed red blood cells since admission, hemoglobin stable overnight 7.5 > 7.5, anemia panel  consistent with iron deficiency , received venofere Endoscopic evaluation   normal upper endoscopy,: Colonoscopy showed diverticulosis of the sigmoid colon, nonbleeding internal hemorrhoids, differential includes diverticular colonic bleeding versus more proximal small bowel source   pt uses NSAID and ASA 81, not on any PPI at home --   recent travel to [place], stool positive stool studies noted (EPEC),  Continue Protonix INR 1.28 per Dr Dr. Havery Moros  Tagged RBC scan or CT angiogram if evidence of  ongoing blood loss Transfuse 1 more unit of packed red blood cells due to symptomatic anemia Requested GI and ID Dr Drucilla Schmidt to see patient and evaluate the cause of his anemia ,wife made aware that  these consults have called  I don't feel patient is actively bleeding   2. Diarrhea, Enteropathogenic e coli - Infection considered, particularly given recent travel to [place] the diarrhea is more likely secondary to EPEC, cannot r/o Gi bleed due to other causes   - Enteric precautions are in place  Discussed with Dr. Megan Salon, patient started on ciprofloxacin 500 by mouth twice a day GI consultation requested 8/4   3. Hyperlipidemia  - Continue Lovaza    4. Allergic rhinitis  - Stable  - Continue daily intranasal steroid and oral antihistamine      DVT prophylaxsis SCDs  Code Status:  Full code     Family Communication: Discussed in detail with the patient, all imaging results, lab results explained to the patient   Disposition Plan: Anticipate discharge home tomorrow     Consultants:  GI  Procedures:  None  Antibiotics: Anti-infectives    Start     Dose/Rate Route Frequency Ordered Stop   01/27/16 2000  ciprofloxacin (CIPRO) tablet 500 mg     500 mg Oral 2 times daily 01/27/16 1551           HPI/Subjective: Denies any further bleeding overnight, had dark brown stools which he attributes to IV iron that he received yesterday  Objective: Vitals:   01/28/16 1154 01/28/16 1641 01/28/16 2007 01/29/16 0406  BP: 118/63 103/62 137/67 100/60  Pulse: 78 85 83 73  Resp: 19 18 19 20   Temp: 98.8 F (37.1 C) 98.3 F (36.8 C) 98.2 F (36.8 C) 98.4  F (36.9 C)  TempSrc: Oral Oral Oral Oral  SpO2: 100% 100% 100% 99%  Weight:   100.7 kg (222 lb 1.6 oz)   Height:        Intake/Output Summary (Last 24 hours) at 01/29/16 0819 Last data filed at 01/29/16 [id number]  Gross per 24 hour  Intake             3015 ml  Output             3000 ml  Net               15 ml    Exam:  Examination:  General exam: Appears calm and comfortable  Respiratory system: Clear to auscultation. Respiratory effort normal. Cardiovascular system: S1 & S2 heard,  RRR. No JVD, murmurs, rubs, gallops or clicks. No pedal edema. Gastrointestinal system: Abdomen is nondistended, soft and nontender. No organomegaly or masses felt. Normal bowel sounds heard. Central nervous system: Alert and oriented. No focal neurological deficits. Extremities: Symmetric 5 x 5 power. Skin: No rashes, lesions or ulcers Psychiatry: Judgement and insight appear normal. Mood & affect appropriate.     Data Reviewed: I have personally reviewed following labs and imaging studies  Micro Results Recent Results (from the past 240 hour(s))  Gastrointestinal Panel by PCR , Stool     Status: Abnormal   Collection Time: 01/27/16  4:05 AM  Result Value Ref Range Status   Campylobacter species NOT DETECTED NOT DETECTED Final   Plesimonas shigelloides NOT DETECTED NOT DETECTED Final   Salmonella species NOT DETECTED NOT DETECTED Final   Yersinia enterocolitica NOT DETECTED NOT DETECTED Final   Vibrio species NOT DETECTED NOT DETECTED Final   Vibrio cholerae NOT DETECTED NOT DETECTED Final   Enteroaggregative E coli (EAEC) NOT DETECTED NOT DETECTED Final   Enteropathogenic E coli (EPEC) DETECTED (A) NOT DETECTED Final    Comment: SPOKE TO [place] 01/27/16 @ 1525  [place] E coli (ETEC) NOT DETECTED NOT DETECTED Final   Shiga like toxin producing E coli (STEC) NOT DETECTED NOT DETECTED Final   E. coli O157 NOT DETECTED NOT DETECTED Final   Shigella/Enteroinvasive E coli (EIEC) NOT DETECTED NOT DETECTED Final   Cryptosporidium NOT DETECTED NOT DETECTED Final   Cyclospora cayetanensis NOT DETECTED NOT DETECTED Final   Entamoeba histolytica NOT DETECTED NOT DETECTED Final   Giardia lamblia NOT DETECTED NOT DETECTED Final   Adenovirus F40/41 NOT DETECTED NOT DETECTED Final   Astrovirus NOT DETECTED NOT DETECTED Final   Norovirus GI/GII NOT DETECTED NOT DETECTED Final   Rotavirus A NOT DETECTED NOT DETECTED Final   Sapovirus (I, II, IV, and V) NOT DETECTED NOT  DETECTED Final  C difficile quick scan w PCR reflex     Status: None   Collection Time: 01/27/16  9:56 AM  Result Value Ref Range Status   C Diff antigen NEGATIVE NEGATIVE Final   C Diff toxin NEGATIVE NEGATIVE Final   C Diff interpretation No C. difficile detected.  Final    Radiology Reports No results found.   CBC  Recent Labs Lab 01/26/16 1430  01/27/16 0600 01/27/16 1811 01/27/16 2355 01/28/16 1207 01/29/16 0454  WBC 7.2  --   --   --   --  5.1 4.5  HGB 9.2*  < > 7.0* 7.4* 6.8* 7.5* 7.5*  HCT 27.0*  < > 20.6* 22.0* 20.5* 22.0* 22.9*  PLT 175  --   --   --   --  118* 157  MCV 91.8  --   --   --   --  87.3 88.4  MCH 31.3  --   --   --   --  29.8 29.0  MCHC 34.1  --   --   --   --  34.1 32.8  RDW 12.5  --   --   --   --  16.0* 16.3*  LYMPHSABS 2.2  --   --   --   --   --  1.5  MONOABS 0.5  --   --   --   --   --  0.3  EOSABS 0.1  --   --   --   --   --  0.1  BASOSABS 0.0  --   --   --   --   --  0.0  < > = values in this interval not displayed.  Chemistries   Recent Labs Lab 01/26/16 1430 01/27/16 0600 01/28/16 0757 01/28/16 1453 01/29/16 0454  NA 136 137 138 134* 139  K 3.7 4.0 3.6 3.5 3.8  CL 107 108 110 108 110  CO2 24 25 24 24 25   GLUCOSE 110* 108* 109* 79 106*  BUN 25* 17 10 9 7   CREATININE 1.02 0.88 1.00 1.03 1.06  CALCIUM 8.0* 7.6* 7.4* 7.2* 7.8*  AST 19  --  16 18 16   ALT 36  --  25 24 23   ALKPHOS 58  --  42 42 41  BILITOT 0.5  --  0.9 0.5 0.6   ------------------------------------------------------------------------------------------------------------------ estimated creatinine clearance is 87.8 mL/min (by C-G formula based on SCr of 1.06 mg/dL). ------------------------------------------------------------------------------------------------------------------ No results for input(s): HGBA1C in the last 72 hours. ------------------------------------------------------------------------------------------------------------------ No results for  input(s): CHOL, HDL, LDLCALC, TRIG, CHOLHDL, LDLDIRECT in the last 72 hours. ------------------------------------------------------------------------------------------------------------------ No results for input(s): TSH, T4TOTAL, T3FREE, THYROIDAB in the last 72 hours.  Invalid input(s): FREET3 ------------------------------------------------------------------------------------------------------------------  Recent Labs  01/26/16 2215 01/27/16 1003  VITAMINB12  --  175*  FOLATE  --  19.0  FERRITIN  --  5*  TIBC  --  290  IRON  --  17*  RETICCTPCT 2.3 3.2*    Coagulation profile  Recent Labs Lab 01/26/16 2215  INR 1.28    No results for input(s): DDIMER in the last 72 hours.  Cardiac Enzymes No results for input(s): CKMB, TROPONINI, MYOGLOBIN in the last 168 hours.  Invalid input(s): CK ------------------------------------------------------------------------------------------------------------------ Invalid input(s): POCBNP   CBG:  Recent Labs Lab 01/27/16 0754 01/28/16 0740  GLUCAP 110* 104*       Studies: No results found.    No results found for: HGBA1C Lab Results  Component Value Date   CREATININE 1.06 01/29/2016       Scheduled Meds: . sodium chloride   Intravenous Once  . ciprofloxacin  500 mg Oral BID  . fluticasone  2 spray Each Nare Daily  . loratadine  10 mg Oral Daily  . omega-3 acid ethyl esters  2 g Oral Daily  . pantoprazole  40 mg Intravenous Q12H  . sodium chloride flush  3 mL Intravenous Q12H   Continuous Infusions: . sodium chloride 500 mL (01/28/16 1050)  . 0.9 % NaCl with KCl 20 mEq / L 75 mL/hr at 01/29/16 0402     LOS: 3 days    Time spent: >30 MINS    [facility]  [facility] Pager [phone]. If 7PM-7AM, please contact night-coverage at [email or web address], password [facility] 01/29/2016, 8:19 AM  LOS: 3  days

## 2016-01-30 ENCOUNTER — Encounter (HOSPITAL_COMMUNITY): Payer: Self-pay | Admitting: *Deleted

## 2016-01-30 ENCOUNTER — Encounter (HOSPITAL_COMMUNITY)
Admission: EM | Disposition: A | Discharge status: Home / Self Care | Payer: Self-pay | Source: Home / Self Care | Attending: Internal Medicine

## 2016-01-30 DIAGNOSIS — K5521 Angiodysplasia of colon with hemorrhage: Secondary | ICD-10-CM

## 2016-01-30 DIAGNOSIS — A040 Enteropathogenic Escherichia coli infection: Secondary | ICD-10-CM

## 2016-01-30 HISTORY — PX: GIVENS CAPSULE STUDY: SHX5432

## 2016-01-30 LAB — CBC
HEMATOCRIT: 25.7 % — AB (ref 39.0–52.0)
HEMOGLOBIN: 8.4 g/dL — AB (ref 13.0–17.0)
MCH: 29.5 pg (ref 26.0–34.0)
MCHC: 32.7 g/dL (ref 30.0–36.0)
MCV: 90.2 fL (ref 78.0–100.0)
Platelets: 177 10*3/uL (ref 150–400)
RBC: 2.85 MIL/uL — AB (ref 4.22–5.81)
RDW: 15.6 % — ABNORMAL HIGH (ref 11.5–15.5)
WBC: 4.5 10*3/uL (ref 4.0–10.5)

## 2016-01-30 LAB — TYPE AND SCREEN
ABO/RH(D): O POS
ANTIBODY SCREEN: NEGATIVE
UNIT DIVISION: 0
UNIT DIVISION: 0
UNIT DIVISION: 0
Unit division: 0

## 2016-01-30 LAB — GLUCOSE, CAPILLARY: GLUCOSE-CAPILLARY: 103 mg/dL — AB (ref 65–99)

## 2016-01-30 LAB — HIV ANTIBODY (ROUTINE TESTING W REFLEX): HIV SCREEN 4TH GENERATION: NONREACTIVE

## 2016-01-30 SURGERY — IMAGING PROCEDURE, GI TRACT, INTRALUMINAL, VIA CAPSULE
Anesthesia: LOCAL

## 2016-01-30 MED ORDER — CYANOCOBALAMIN 2000 MCG PO TABS: 2000.0000 ug | ORAL_TABLET | Freq: Every day | ORAL | 1 refills | Status: DC

## 2016-01-30 MED ORDER — VITAMIN B-12 1000 MCG PO TABS
2000.0000 ug | ORAL_TABLET | Freq: Every day | ORAL | Status: DC
  Administered 2016-01-30: 2000 ug via ORAL
  Filled 2016-01-30: qty 2

## 2016-01-30 SURGICAL SUPPLY — 1 items: TOWEL COTTON PACK 4EA (MISCELLANEOUS) ×4 IMPLANT

## 2016-01-30 NOTE — Progress Notes (Signed)
[facility] PROGRESS NOTE  Kent Montgomery [id number] DOB: 20-Sep-1955 DOA: 01/26/2016   PCP: Haywood Pao, MD     Assessment/Plan: Principal Problem:   Lower GI bleed Active Problems:   Symptomatic anemia   Bloody diarrhea   Sigmoid diverticulosis   History of colonic polyps   Hyperlipidemia   Acute GI bleeding   Anemia, iron deficiency   Diarrhea of infectious origin   60 y.o. male with medical history significant for hyperlipidemia, allergic rhinitis, colonic polyps, and sigmoid diverticulosis who presents to the emergency department with bloody diarrhea of 2 days' duration. Patient reports pain in his usual state of health, having returned from a trip to [place] 12 days ago, in until the development of bloody diarrhea yesterday.He has had 2 colonoscopies, most recently in April 2015, notable for polyps and mild sigmoid diverticulosis. Hypotensive in the ER.Dr. Havery Moros of GI was consulted by the ED physician   Assessment and plan 1. Acute GI bleed with symptomatic anemia  - Presents to [facility] with   bloody diarrhea,   - Initial Hgb 9.2;  hemoglobin   down to 6.8. Baseline hemoglobin 15.9 in 2012 , status post 4 units of packed red blood cells since admission, hemoglobin stable overnight 7.5 > 7.5,>8.4, corrected appropriately anemia panel  consistent with iron deficiency , received venofere Endoscopic evaluation   normal upper endoscopy,: Colonoscopy showed diverticulosis of the sigmoid colon, nonbleeding internal hemorrhoids, differential includes diverticular colonic bleeding versus more proximal small bowel source   pt uses NSAID and ASA 81, not on any PPI at home --   recent travel to [place], stool positive stool studies noted (EPEC), Fortunately the patient does not have E coli 0157, shigella or shiga toxin producing organism etc, I do recommend 6 more doses of ciprofloxacin and then discontinue Continue Protonix INR 1.28 GI initiated capsule  endoscopy Wife also requesting hematology consult. Suspect that the patient does not need a hematology consult in the setting of ongoing GI workup, and this can be done as an outpatient. I have notified Dr. Julien Nordmann about this consult, he recommends outpatient follow-up of his hemoglobin does not improve in the next couple of weeks. Wife requested ID consult yesterday which has been completed Patient also has vitamin B-12 deficiency and has been started on vitamin B-12 supplementation check for HIV, hep A, B and C infection/immunity   2. Diarrhea, Enteropathogenic e coli - Infection considered, particularly given recent travel to [place] the diarrhea is more likely secondary to EPEC, cannot r/o Gi bleed due to other causes   HIV and hepatitis panel pending Continue ciprofloxacin 6 doses   3. Hyperlipidemia  - Continue Lovaza    4. Allergic rhinitis  - Stable  - Continue daily intranasal steroid and oral antihistamine      DVT prophylaxsis SCDs  Code Status:  Full code     Family Communication: Discussed in detail with the patient, all imaging results, lab results explained to the patient   Disposition Plan: Anticipate discharge home tomorrow if hemoglobin stable     Consultants:  GI    hematology  ID  Procedures:  EGD/colonoscopy/capsular endoscopy  Antibiotics: Anti-infectives    Start     Dose/Rate Route Frequency Ordered Stop   01/29/16 2000  ciprofloxacin (CIPRO) tablet 500 mg     500 mg Oral 2 times daily 01/29/16 1215 02/01/16 1959   01/27/16 2000  ciprofloxacin (CIPRO) tablet 500 mg  Status:  Discontinued  500 mg Oral 2 times daily 01/27/16 1551 01/29/16 1215         HPI/Subjective: No recurrence of bleeding overnight  Objective: Vitals:   01/29/16 1230 01/29/16 1816 01/29/16 2005 01/30/16 0800  BP: 120/68 120/62 122/65 (!) 104/59  Pulse: 84 82 90 72  Resp: 20 20 19 20   Temp: 98.6 F (37 C) 98.4 F (36.9 C) 98.4 F  (36.9 C) 98 F (36.7 C)  TempSrc: Oral Oral Oral Oral  SpO2: 99% 98% 100% 100%  Weight:      Height:        Intake/Output Summary (Last 24 hours) at 01/30/16 0913 Last data filed at 01/30/16 0858  Gross per 24 hour  Intake          2623.75 ml  Output             3800 ml  Net         -1176.25 ml    Exam:  Examination:  General exam: Appears calm and comfortable  Respiratory system: Clear to auscultation. Respiratory effort normal. Cardiovascular system: S1 & S2 heard, RRR. No JVD, murmurs, rubs, gallops or clicks. No pedal edema. Gastrointestinal system: Abdomen is nondistended, soft and nontender. No organomegaly or masses felt. Normal bowel sounds heard. Central nervous system: Alert and oriented. No focal neurological deficits. Extremities: Symmetric 5 x 5 power. Skin: No rashes, lesions or ulcers Psychiatry: Judgement and insight appear normal. Mood & affect appropriate.     Data Reviewed: I have personally reviewed following labs and imaging studies  Micro Results Recent Results (from the past 240 hour(s))  Gastrointestinal Panel by PCR , Stool     Status: Abnormal   Collection Time: 01/27/16  4:05 AM  Result Value Ref Range Status   Campylobacter species NOT DETECTED NOT DETECTED Final   Plesimonas shigelloides NOT DETECTED NOT DETECTED Final   Salmonella species NOT DETECTED NOT DETECTED Final   Yersinia enterocolitica NOT DETECTED NOT DETECTED Final   Vibrio species NOT DETECTED NOT DETECTED Final   Vibrio cholerae NOT DETECTED NOT DETECTED Final   Enteroaggregative E coli (EAEC) NOT DETECTED NOT DETECTED Final   Enteropathogenic E coli (EPEC) DETECTED (A) NOT DETECTED Final    Comment: SPOKE TO [place] 01/27/16 @ 1525  [place] E coli (ETEC) NOT DETECTED NOT DETECTED Final   Shiga like toxin producing E coli (STEC) NOT DETECTED NOT DETECTED Final   E. coli O157 NOT DETECTED NOT DETECTED Final   Shigella/Enteroinvasive E coli (EIEC) NOT  DETECTED NOT DETECTED Final   Cryptosporidium NOT DETECTED NOT DETECTED Final   Cyclospora cayetanensis NOT DETECTED NOT DETECTED Final   Entamoeba histolytica NOT DETECTED NOT DETECTED Final   Giardia lamblia NOT DETECTED NOT DETECTED Final   Adenovirus F40/41 NOT DETECTED NOT DETECTED Final   Astrovirus NOT DETECTED NOT DETECTED Final   Norovirus GI/GII NOT DETECTED NOT DETECTED Final   Rotavirus A NOT DETECTED NOT DETECTED Final   Sapovirus (I, II, IV, and V) NOT DETECTED NOT DETECTED Final  C difficile quick scan w PCR reflex     Status: None   Collection Time: 01/27/16  9:56 AM  Result Value Ref Range Status   C Diff antigen NEGATIVE NEGATIVE Final   C Diff toxin NEGATIVE NEGATIVE Final   C Diff interpretation No C. difficile detected.  Final    Radiology Reports No results found.   [place]  Recent Labs Lab 01/26/16 1430  01/27/16 2355 01/28/16 1207 01/29/16 0454  01/29/16 1516 01/30/16 0535  WBC 7.2  --   --  5.1 4.5 5.5 4.5  HGB 9.2*  < > 6.8* 7.5* 7.5* 8.5* 8.4*  HCT 27.0*  < > 20.5* 22.0* 22.9* 26.0* 25.7*  PLT 175  --   --  118* 157 171 177  MCV 91.8  --   --  87.3 88.4 88.4 90.2  MCH 31.3  --   --  29.8 29.0 28.9 29.5  MCHC 34.1  --   --  34.1 32.8 32.7 32.7  RDW 12.5  --   --  16.0* 16.3* 15.6* 15.6*  LYMPHSABS 2.2  --   --   --  1.5  --   --   MONOABS 0.5  --   --   --  0.3  --   --   EOSABS 0.1  --   --   --  0.1  --   --   BASOSABS 0.0  --   --   --  0.0  --   --   < > = values in this interval not displayed.  Chemistries   Recent Labs Lab 01/26/16 1430 01/27/16 0600 01/28/16 0757 01/28/16 1453 01/29/16 0454  NA 136 137 138 134* 139  K 3.7 4.0 3.6 3.5 3.8  CL 107 108 110 108 110  CO2 24 25 24 24 25   GLUCOSE 110* 108* 109* 79 106*  BUN 25* 17 10 9 7   CREATININE 1.02 0.88 1.00 1.03 1.06  CALCIUM 8.0* 7.6* 7.4* 7.2* 7.8*  AST 19  --  16 18 16   ALT 36  --  25 24 23   ALKPHOS 58  --  42 42 41  BILITOT 0.5  --  0.9 0.5 0.6    ------------------------------------------------------------------------------------------------------------------ estimated creatinine clearance is 87.8 mL/min (by C-G formula based on SCr of 1.06 mg/dL). ------------------------------------------------------------------------------------------------------------------ No results for input(s): HGBA1C in the last 72 hours. ------------------------------------------------------------------------------------------------------------------ No results for input(s): CHOL, HDL, LDLCALC, TRIG, CHOLHDL, LDLDIRECT in the last 72 hours. ------------------------------------------------------------------------------------------------------------------ No results for input(s): TSH, T4TOTAL, T3FREE, THYROIDAB in the last 72 hours.  Invalid input(s): FREET3 ------------------------------------------------------------------------------------------------------------------  Recent Labs  01/27/16 1003  VITAMINB12 175*  FOLATE 19.0  FERRITIN 5*  TIBC 290  IRON 17*  RETICCTPCT 3.2*    Coagulation profile  Recent Labs Lab 01/26/16 2215  INR 1.28    No results for input(s): DDIMER in the last 72 hours.  Cardiac Enzymes No results for input(s): CKMB, TROPONINI, MYOGLOBIN in the last 168 hours.  Invalid input(s): CK ------------------------------------------------------------------------------------------------------------------ Invalid input(s): POCBNP   CBG:  Recent Labs Lab 01/27/16 0754 01/28/16 0740 01/29/16 0813 01/30/16 0738  GLUCAP 110* 104* 104* 103*       Studies: No results found.    No results found for: HGBA1C Lab Results  Component Value Date   CREATININE 1.06 01/29/2016       Scheduled Meds: . sodium chloride   Intravenous Once  . ciprofloxacin  500 mg Oral BID  . fluticasone  2 spray Each Nare Daily  . loratadine  10 mg Oral Daily  . omega-3 acid ethyl esters  2 g Oral Daily  . sodium chloride flush   3 mL Intravenous Q12H   Continuous Infusions: . sodium chloride 500 mL (01/28/16 1050)  . 0.9 % NaCl with KCl 20 mEq / L 75 mL/hr at 01/29/16 1914     LOS: 4 days    Time spent: >30 MINS    [facility]  [facility] Pager [phone].  If 7PM-7AM, please contact night-coverage at [email or web address], password [facility] 01/30/2016, 9:13 AM  LOS: 4 days

## 2016-01-30 NOTE — Progress Notes (Signed)
Daily Rounding Note  01/30/2016, 11:09 AM  LOS: 4 days   SUBJECTIVE:   Chief complaint: dark stools   Stool this AM: soft, unformed.  No blood,  Dark brown in color.  Feels well.  Capsule study in progress.  Wants to go home tonight once capsule is completed.  No weakness, dizziness, SOB.    OBJECTIVE:         Vital signs in last 24 hours:    Temp:  [98 F (36.7 C)-98.6 F (37 C)] 98 F (36.7 C) (08/08 0800) Pulse Rate:  [72-90] 72 (08/08 0800) Resp:  [19-20] 20 (08/08 0800) BP: (104-122)/(59-68) 104/59 (08/08 0800) SpO2:  [98 %-100 %] 100 % (08/08 0800) Last BM Date: 01/29/16 Filed Weights   01/26/16 1354 01/26/16 2113 01/28/16 2007  Weight: 92.5 kg (204 lb) 94.8 kg (208 lb 15.9 oz) 100.7 kg (222 lb 1.6 oz)   General: looks well, comfortable   Heart: RRR Chest: clear bil.  Abdomen: soft, NT, ND.  No mass.  Active BS  Extremities: no CCE.   Neuro/Psych:  Pleasant, alert/oriented x 3. No obvious weakness or deficits  Intake/Output from previous day: 08/07 0701 - 08/08 0700 In: 2863.8 [P.O.:720; I.V.:1808.8; Blood:335] Out: [id number] [Urine:3675]  Lab Results:  Recent Labs  01/29/16 0454 01/29/16 1516 01/30/16 0535  WBC 4.5 5.5 4.5  HGB 7.5* 8.5* 8.4*  HCT 22.9* 26.0* 25.7*  PLT 157 171 177     ASSESMENT:   *  GI bleeding and IDA.  Differential includes diverticular colonic bleeding vs. more promixal small bowel source.  EGD 8/6: normal.  Colonoscopy 8/6: blood present, mostly in left side.  Sigmoid tics.  Darkish secretions with ? blood tinege in terminal ileum - unclear if backwash from colon versus bleeding from more proximal small bowel,   No recurrence of adenomatous colon polyps as found on 09/2013 colonoscopy.   Enteropathogennic E coli per stool study, cipro po day 3, received 6 of the 6 total doses recommended by Dr Tommy Medal, so stopping this .  01/30/16 Capsule Endo: in progress.    *  Blood loss  anemia, normocytic but ferritin 6, c/w IDA.  Ferric gluconate IV on 8/6.  S/p PRBCs x 4, last was completed at noon 8/7, Hgb improved and stable.     *  Thrombocytopenia to 118, resolved.   *  Hx elevated LFTs, Fatty liver per ultrasound 2012. Liver biopsy 10/2010: benign, no steatosis, fibrosis, iron deposition.      PLAN   *  Pt ok for discharge after completing capsule study ~ 830 PM tonight.  He really wants to go home.  I have messaged Dr Allyson Sabal to see if he can discharge tonight instead of staying overnight.  Staff will retrieve recorder tomorrow AM and load images, so hopefully can be read by the end of day 8/9 but pt need not stay for this.  GI, Dr Carlean Purl or Pyrtle will contact pt with results of CE and, depending on findings, set up future studies, office visit etc.    *  Stop cipro as he received the 6 doses of cipro.     Azucena Freed  01/30/2016, 11:09 AM Pager: [phone]    [facility] Attending   I have taken an interval history, reviewed the chart and examined the patient. I agree with the Advanced Practitioner's note, impression and recommendations.   Bleeding improved/resolved. Plans as above.  Gatha Mayer, MD, Mar[facility] Regal  [facility] [phone] (pager) [phone] after 5 PM, weekends and holidays  01/30/2016 5:56 PM

## 2016-01-30 NOTE — Progress Notes (Signed)
Tolerated capsule endoscopy well  Start time 08:40 end time 20:40.

## 2016-01-31 ENCOUNTER — Telehealth: Payer: Self-pay | Admitting: Internal Medicine

## 2016-01-31 ENCOUNTER — Other Ambulatory Visit: Payer: Self-pay | Admitting: *Deleted

## 2016-01-31 DIAGNOSIS — D62 Acute posthemorrhagic anemia: Secondary | ICD-10-CM

## 2016-01-31 DIAGNOSIS — K5520 Angiodysplasia of colon without hemorrhage: Secondary | ICD-10-CM

## 2016-01-31 LAB — HCV COMMENT:

## 2016-01-31 LAB — HEPATITIS A ANTIBODY, TOTAL: Hep A Total Ab: NEGATIVE

## 2016-01-31 LAB — HEPATITIS C ANTIBODY (REFLEX)

## 2016-01-31 LAB — HEPATITIS B SURFACE ANTIGEN: HEP B S AG: NEGATIVE

## 2016-01-31 LAB — HEPATITIS B SURFACE ANTIBODY, QUANTITATIVE: Hepatitis B-Post: 19.7 m[IU]/mL (ref >9.9)

## 2016-01-31 NOTE — Patient Outreach (Signed)
[facility]) Care Management  01/31/2016  RAZ VANDIEST [birth year] [id number]   Subjective: Telephone call to patient's home / mobile number, no answer, left HIPAA compliant voicemail message, and requested call back.  Objective: Per chart review:  Patient hospitalized 01/26/16 - 01/30/16 for lower GI bleed.   Patient has a history of hyperlipidemia, Sigmoid diverticulosis, and  iron deficiency anemia.     Assessment: Received [facility] Transition of care referral on 01/30/16.  Transition of care follow up pending, patient contact.   Plan: RNCM will call patient within 10 business days for 2nd telephone outreach attempt, transition of care follow up, if no return call.   Dacy Enrico H. Annia Friendly, BSN, [place] [facility] Telephonic CM Phone: [phone] Fax: [phone]

## 2016-01-31 NOTE — Telephone Encounter (Signed)
I called w/ capsule endoscopy results - has bleeding in distal small bowel - 2 areas at 5 hrs 8 mins and 5 hrs 24 mins after ingestion. Some AVM's also - more proximal.  No melena or hematochezia seen. We will refer for ballon enteroscopy  I have ordered a CBC for tomorrow  He knows to avoid ASA/NSAID and to report any bleeding/go to hospital  OK to work Avoid strenuous activity

## 2016-02-01 ENCOUNTER — Telehealth: Payer: Self-pay | Admitting: Internal Medicine

## 2016-02-01 ENCOUNTER — Ambulatory Visit: Payer: Self-pay | Admitting: *Deleted

## 2016-02-01 ENCOUNTER — Other Ambulatory Visit: Payer: Self-pay | Admitting: *Deleted

## 2016-02-01 ENCOUNTER — Other Ambulatory Visit (INDEPENDENT_AMBULATORY_CARE_PROVIDER_SITE_OTHER): Payer: 59

## 2016-02-01 DIAGNOSIS — K5520 Angiodysplasia of colon without hemorrhage: Secondary | ICD-10-CM

## 2016-02-01 DIAGNOSIS — D62 Acute posthemorrhagic anemia: Secondary | ICD-10-CM | POA: Diagnosis not present

## 2016-02-01 LAB — CBC WITH DIFFERENTIAL/PLATELET
BASOS ABS: 0 10*3/uL (ref 0.0–0.1)
BASOS PCT: 0.5 % (ref 0.0–3.0)
EOS ABS: 0.2 10*3/uL (ref 0.0–0.7)
Eosinophils Relative: 3.7 % (ref 0.0–5.0)
HCT: 28.5 % — ABNORMAL LOW (ref 39.0–52.0)
HEMOGLOBIN: 9.5 g/dL — AB (ref 13.0–17.0)
LYMPHS PCT: 27.6 % (ref 12.0–46.0)
Lymphs Abs: 1.3 10*3/uL (ref 0.7–4.0)
MCHC: 33.2 g/dL (ref 30.0–36.0)
MCV: 89.3 fl (ref 78.0–100.0)
MONO ABS: 0.5 10*3/uL (ref 0.1–1.0)
Monocytes Relative: 10.1 % (ref 3.0–12.0)
NEUTROS PCT: 58.1 % (ref 43.0–77.0)
Neutro Abs: 2.8 10*3/uL (ref 1.4–7.7)
Platelets: 255 10*3/uL (ref 150.0–400.0)
RBC: 3.2 Mil/uL — AB (ref 4.22–5.81)
RDW: 15.9 % — AB (ref 11.5–15.5)
WBC: 4.8 10*3/uL (ref 4.0–10.5)

## 2016-02-01 NOTE — Patient Outreach (Signed)
[place] [facility]) Care Management  02/01/2016  Kent Montgomery [birth year] [id number]   Subjective: Telephone call to patient's home/ mobile number, spoke with patient, states he is currently with a patient, and will call RNCM back.  Call disconnected prior to verification of address and date of birth.   Objective: Per chart review:  Patient hospitalized 01/26/16 - 01/30/16 for lower GI bleed.   Patient has a history of hyperlipidemia, Sigmoid diverticulosis, and  iron deficiency anemia.     Assessment: Received UMR Transition of care referral on 01/30/16.  Transition of care follow up pending, patient contact.   Plan: RNCM will call patient within 10 business days for 3rd telephone outreach attempt, transition of care follow up, if no return call.   Katilynn Sinkler H. Annia Friendly, BSN, [place] [facility] Telephonic CM Phone: [phone] Fax: [phone]

## 2016-02-01 NOTE — Progress Notes (Signed)
Let him know Hgb better at 9.5 Please initiate a referral to Dr. Ralph Dowdy at [facility] for evaluation of distal small bowel bleeding seen on capsule endoscopy.

## 2016-02-01 NOTE — Telephone Encounter (Signed)
Kent Mayer, MD  Marlon Pel, RN        Let him know Hgb better at 9.5  Please initiate a referral to Dr. Ralph Dowdy at [facility] for evaluation of distal small bowel bleeding seen on capsule endoscopy.

## 2016-02-01 NOTE — Telephone Encounter (Signed)
Pt has been notified of the lab and referral made and he will call if he does not hear from that office in 1 week.              referral faxed to [phone]  Phone [phone] [employer] Location to avoid parking fee

## 2016-02-02 ENCOUNTER — Ambulatory Visit: Payer: Self-pay | Admitting: *Deleted

## 2016-02-02 ENCOUNTER — Ambulatory Visit (INDEPENDENT_AMBULATORY_CARE_PROVIDER_SITE_OTHER)
Admission: RE | Admit: 2016-02-02 | Discharge: 2016-02-02 | Disposition: A | Discharge status: Home / Self Care | Payer: 59 | Source: Ambulatory Visit | Attending: Physician Assistant | Admitting: Physician Assistant

## 2016-02-02 ENCOUNTER — Telehealth: Payer: Self-pay | Admitting: Internal Medicine

## 2016-02-02 ENCOUNTER — Other Ambulatory Visit: Payer: Self-pay | Admitting: *Deleted

## 2016-02-02 DIAGNOSIS — Z0389 Encounter for observation for other suspected diseases and conditions ruled out: Secondary | ICD-10-CM | POA: Diagnosis not present

## 2016-02-02 DIAGNOSIS — T189XXA Foreign body of alimentary tract, part unspecified, initial encounter: Secondary | ICD-10-CM | POA: Diagnosis not present

## 2016-02-02 NOTE — Patient Outreach (Signed)
[place] [facility]) Care Management  02/02/2016  LYSLE SMOLINSKI 1956/05/08 [id number]   Subjective: Telephone call to patient's home / mobile number, no answer, left HIPAA compliant voicemail message, and requested call back.  Objective: Per chart review:  Patient hospitalized 01/26/16 - 01/30/16 for lower GI bleed.   Patient has a history of hyperlipidemia, Sigmoid diverticulosis, and  iron deficiency anemia.     Assessment: Received UMR Transition of care referral on 01/30/16.  Transition of care follow up pending, patient contact.   Plan: RNCM will send patient unsuccessful outreach letter, and proceed with case closure,  if no return call within 10 business days.    Jerri Hargadon H. Annia Friendly, BSN, [place] [facility] Telephonic CM Phone: [phone] Fax: [phone]

## 2016-02-02 NOTE — Telephone Encounter (Signed)
The pt called to state he has not seen the capsule pass and would like to get an xray.  The xray was ordered. The pt will be in today.  FYI Amy

## 2016-02-02 NOTE — Telephone Encounter (Signed)
That's fine- study was complete so he probably just didn't see it

## 2016-02-04 LAB — OVA + PARASITE EXAM

## 2016-02-04 LAB — O&P RESULT

## 2016-02-05 ENCOUNTER — Telehealth: Payer: Self-pay | Admitting: Internal Medicine

## 2016-02-05 NOTE — Telephone Encounter (Signed)
I left a message that records have been sent to [facility] and additional information was sent this morning at their request.  He is advised that he should hear from them in the next few days.

## 2016-02-05 NOTE — Telephone Encounter (Signed)
Pt xray proved that capsule did pass, pt was notified .

## 2016-02-05 NOTE — Telephone Encounter (Signed)
Records faxed to [facility] along with referral form they requested

## 2016-02-07 ENCOUNTER — Encounter: Payer: Self-pay | Admitting: Internal Medicine

## 2016-02-12 NOTE — Discharge Summary (Signed)
Physician Discharge Summary  Kent Montgomery MRN: [id number] DOB/AGE: 1955/09/06 60 y.o.  PCP: Haywood Pao, MD   Admit date: 01/26/2016 Discharge date: 02/12/2016  Discharge Diagnoses:    Principal Problem:   Lower GI bleed Active Problems:   Symptomatic anemia   Bloody diarrhea   Sigmoid diverticulosis   History of colonic polyps   Hyperlipidemia   Acute GI bleeding   Anemia, iron deficiency   Diarrhea of infectious origin   Enteropathogenic Escherichia coli infection    Follow-up recommendations Follow-up with PCP in 3-5 days , including all  additional recommended appointments as below Follow-up CBC, CMP in 3-5 days       Discharge Medication List as of 01/30/2016  8:11 PM    START taking these medications   Details  vitamin B-12 2000 MCG tablet Take 1 tablet (2,000 mcg total) by mouth daily., Starting Tue 01/30/2016, Normal      CONTINUE these medications which have NOT CHANGED   Details  Coenzyme Q10 (COQ10 PO) Take 1 tablet by mouth daily., Historical Med    ezetimibe (ZETIA) 10 MG tablet Take 10 mg by mouth daily., Starting Fri 12/08/2015, Historical Med    fluticasone (FLONASE) 50 MCG/ACT nasal spray Place 1 spray into both nostrils daily as needed (seasonal allergies). , Historical Med    loratadine (CLARITIN) 10 MG tablet Take 10 mg by mouth daily as needed (seasonal allergies). , Historical Med    MEGARED OMEGA-3 KRILL OIL PO Take 1 capsule by mouth daily. , Historical Med    Multiple Vitamin (MULTIVITAMIN WITH MINERALS) TABS tablet Take 1 tablet by mouth daily., Historical Med    !! OVER THE COUNTER MEDICATION Take 1 tablet by mouth daily. One of the B vitamins, Historical Med    !! OVER THE COUNTER MEDICATION Place 1 drop into both eyes daily as needed (dry eyes). Over the counter lubricating eye drops, Historical Med    Probiotic Product (RA PROBIOTIC GUMMIES) CHEW Chew 3 tablets by mouth daily., Historical Med    S-Adenosylmethionine (SAM-E)  400 MG TABS Take 800 mg by mouth daily., Historical Med    TURMERIC PO Take 2 tablets by mouth 2 (two) times daily as needed., Historical Med     !! - Potential duplicate medications found. Please discuss with provider.    STOP taking these medications     aspirin EC 81 MG tablet      fish oil-omega-3 fatty acids 1000 MG capsule      ibuprofen (ADVIL,MOTRIN) 200 MG tablet      MELOXICAM PO      naproxen sodium (ALEVE) 220 MG tablet      Psyllium (METAMUCIL PO)          Discharge Condition: Stable    Discharge Instructions Get Medicines reviewed and adjusted: Please take all your medications with you for your next visit with your Primary MD  Please request your Primary MD to go over all hospital tests and procedure/radiological results at the follow up, please ask your Primary MD to get all Hospital records sent to his/her office.  If you experience worsening of your admission symptoms, develop shortness of breath, life threatening emergency, suicidal or homicidal thoughts you must seek medical attention immediately by calling 911 or calling your MD immediately if symptoms less severe.  You must read complete instructions/literature along with all the possible adverse reactions/side effects for all the Medicines you take and that have been prescribed to you. Take any new Medicines after you  have completely understood and accpet all the possible adverse reactions/side effects.   Do not drive when taking Pain medications.   Do not take more than prescribed Pain, Sleep and Anxiety Medications  Special Instructions: If you have smoked or chewed Tobacco in the last 2 yrs please stop smoking, stop any regular Alcohol and or any Recreational drug use.  Wear Seat belts while driving.  Please note  You were cared for by a hospitalist during your hospital stay. Once you are discharged, your primary care physician will handle any further medical issues. Please note that NO  REFILLS for any discharge medications will be authorized once you are discharged, as it is imperative that you return to your primary care physician (or establish a relationship with a primary care physician if you do not have one) for your aftercare needs so that they can reassess your need for medications and monitor your lab values.  Discharge Instructions    AMB Referral to [place] [facility]    Complete by:  As directed   Please assign UMR member for post toc call. Currently at [facility]. Thank you and please call with questions. Marthenia Rolling, [place], RN,BSN [facility] [id number]   Reason for consult:  Please assign UMR member for post toc   Expected date of contact:  1-3 days (reserved for hospital discharges)   Diet - low sodium heart healthy    Complete by:  As directed   Increase activity slowly    Complete by:  As directed       Allergies  Allergen Reactions  . Droperidol Anaphylaxis  . Cefuroxime Axetil Rash      Disposition: 01-Home or Self Care   Consults:  GI     Significant Diagnostic Studies:  Dg Abd 1 View  Result Date: 02/02/2016 CLINICAL DATA:  Evaluate for EXAM: ABDOMEN - 1 VIEW COMPARISON:  None. FINDINGS: Scattered large and small bowel gas is noted. Givens capsule is not identified on this exam. No acute bony abnormality is noted. IMPRESSION: No capsule identified. Electronically Signed   By: Inez Catalina M.D.   On: 02/02/2016 15:48        Filed Weights   01/26/16 1354 01/26/16 2113 01/28/16 2007  Weight: 92.5 kg (204 lb) 94.8 kg (208 lb 15.9 oz) 100.7 kg (222 lb 1.6 oz)     Microbiology: No results found for this or any previous visit (from the past 240 hour(s)).     Blood Culture No results found for: SDES, SPECREQUEST, CULT, REPTSTATUS    Labs: No results found for this or any previous visit (from the past 48 hour(s)).   Lipid Panel  No results found for: CHOL, TRIG, HDL, CHOLHDL, VLDL, LDLCALC,  LDLDIRECT   No results found for: HGBA1C   Lab Results  Component Value Date   CREATININE 1.06 01/29/2016     HPI :*   60 y.o.malewith medical history significant for hyperlipidemia,allergic rhinitis, colonic polyps, and sigmoid diverticulosis who presents to the emergency department with bloody diarrhea of 2 days' duration. Patient reports pain in his usual state of health, having returned from a trip to [place] 12 days ago, in until the development of bloody diarrhea yesterday.He has had 2 colonoscopies, most recently in April 2015, notable for polyps and mild sigmoid diverticulosis. Hypotensive in the ER.Dr. Havery Moros of GI was consulted by the ED physician   HOSPITAL COURSE:    1. Acute GI bleed with symptomatic anemia  - Presents to [facility] with  bloody diarrhea,   - Initial Hgb 9.2;  hemoglobin   down to 6.8. Baseline hemoglobin 15.9 in 2012 , status post 4 units of packed red blood cells since admission, hemoglobin stable overnight 7.5 > 7.5,>8.4, corrected appropriately anemia panel  consistent with iron deficiency , received venofere Endoscopic evaluation   normal upper endoscopy,: Colonoscopy showed diverticulosis of the sigmoid colon, nonbleeding internal hemorrhoids, differential includes diverticular colonic bleeding versus more proximal small bowel source   pt uses NSAID and ASA 81, not on any PPI at home --   recent travel to [place], stool positive stool studies noted (EPEC), Fortunately the patient does not have E coli 0157, shigella or shiga toxin producing organism etc, rx with  ciprofloxacin  As per ID  GI initiated capsule endoscopy,Pt ok for discharge after completing capsule study ~ 830 PM tonight per GI Wife also requested hematology consult.  notified Dr. Julien Nordmann about this consult, he recommends outpatient follow-up of his hemoglobin does not improve in the next couple of weeks. Wife requested ID consult yesterday which has been completed Patient also has  vitamin B-12 deficiency and has been started on vitamin B-12 supplementation     2. Diarrhea, Enteropathogenic e coli - Infection considered, particularly given recent travel to [place] the diarrhea is more likely secondary to EPEC,   Gi bleed with work up as above  HIV and hepatitis panel pending Continue ciprofloxacin 6 doses   3. Hyperlipidemia  - Continue Lovaza   4. Allergic rhinitis  - Stable  - Continue daily intranasal steroid and oral antihistamine   Discharge Exam:   Blood pressure (!) 104/59, pulse 72, temperature 98 F (36.7 C), temperature source Oral, resp. rate 20, height 5\' 9"  (1.753 m), weight 100.7 kg (222 lb 1.6 oz), SpO2 100 %.   General exam: Appears calm and comfortable  Respiratory system: Clear to auscultation. Respiratory effort normal. Cardiovascular system: S1 & S2 heard, RRR. No JVD, murmurs, rubs, gallops or clicks. No pedal edema. Gastrointestinal system: Abdomen is nondistended, soft and nontender. No organomegaly or masses felt. Normal bowel sounds heard. Central nervous system: Alert and oriented. No focal neurological deficits. Extremities: Symmetric 5 x 5 power. Skin: No rashes, lesions or ulcers Psychiatry: Judgement and insight appear normal. Mood & affect appropriate.       SignedReyne Dumas 02/12/2016, 6:08 PM        Time spent >45 mins

## 2016-02-14 NOTE — Telephone Encounter (Signed)
Patient is scheduled for [facility] endo on 03/15/16 10:00 with Dr. Malissa Hippo.  Patient is aware of the appt and details

## 2016-02-16 ENCOUNTER — Other Ambulatory Visit: Payer: Self-pay | Admitting: *Deleted

## 2016-02-16 NOTE — Patient Outreach (Addendum)
[place] [facility]) Care Management  02/16/2016  Kent Montgomery 09/17/1955 [id number]  No response from patient outreach attempts.  Objective: Per chart review: Patient hospitalized 01/26/16 - 01/30/16 for lower GI bleed. Patient has a history of hyperlipidemia, Sigmoid diverticulosis, and iron deficiency anemia.   Assessment: Received UMR Transition of care referral on 01/30/16. Transition of care follow up not completed,  patient unable to contact, and will proceed to case closure.    Plan:RNCM will send case closure due to unable to contact request to [facility].      Jaquanna Ballentine H. Annia Friendly, BSN, [place] [facility] Telephonic CM Phone: [phone] Fax: [phone]

## 2016-02-23 HISTORY — PX: SMALL BOWEL ENTEROSCOPY: SHX2415

## 2016-03-04 DIAGNOSIS — L821 Other seborrheic keratosis: Secondary | ICD-10-CM | POA: Diagnosis not present

## 2016-03-04 DIAGNOSIS — L723 Sebaceous cyst: Secondary | ICD-10-CM | POA: Diagnosis not present

## 2016-03-04 DIAGNOSIS — D1801 Hemangioma of skin and subcutaneous tissue: Secondary | ICD-10-CM | POA: Diagnosis not present

## 2016-03-04 DIAGNOSIS — Z85828 Personal history of other malignant neoplasm of skin: Secondary | ICD-10-CM | POA: Diagnosis not present

## 2016-03-04 DIAGNOSIS — L738 Other specified follicular disorders: Secondary | ICD-10-CM | POA: Diagnosis not present

## 2016-03-11 DIAGNOSIS — H524 Presbyopia: Secondary | ICD-10-CM | POA: Diagnosis not present

## 2016-03-15 ENCOUNTER — Other Ambulatory Visit: Payer: Self-pay

## 2016-03-15 ENCOUNTER — Telehealth: Payer: Self-pay | Admitting: Internal Medicine

## 2016-03-15 DIAGNOSIS — D649 Anemia, unspecified: Secondary | ICD-10-CM | POA: Diagnosis not present

## 2016-03-15 DIAGNOSIS — R933 Abnormal findings on diagnostic imaging of other parts of digestive tract: Secondary | ICD-10-CM | POA: Diagnosis not present

## 2016-03-15 DIAGNOSIS — K922 Gastrointestinal hemorrhage, unspecified: Secondary | ICD-10-CM | POA: Diagnosis not present

## 2016-03-15 DIAGNOSIS — K921 Melena: Secondary | ICD-10-CM

## 2016-03-15 DIAGNOSIS — M1990 Unspecified osteoarthritis, unspecified site: Secondary | ICD-10-CM | POA: Diagnosis not present

## 2016-03-15 DIAGNOSIS — Q438 Other specified congenital malformations of intestine: Secondary | ICD-10-CM | POA: Diagnosis not present

## 2016-03-15 DIAGNOSIS — Z85828 Personal history of other malignant neoplasm of skin: Secondary | ICD-10-CM | POA: Diagnosis not present

## 2016-03-15 NOTE — Telephone Encounter (Signed)
Left message for pt to call back.  Meckel's Diverticulum is a small abnormality in the small intestine that is present at birth. This abnormality sometimes contains gastric mucosa from the stomach, which can cause local ulcers and bleeding. A Meckel's scan is a diagnostic imaging procedure that detects the abnormally-located gastric mucosa.

## 2016-03-15 NOTE — Telephone Encounter (Signed)
Patient was admitted in early August with occult GI bleeding found to have bleeding in the mid to distal small bowel by video capsule study after unremarkable upper endoscopy and colonoscopy. He had deep retrograde Spirus enteroscopy with Dr. Ralph Dowdy at [facility] recently. Dr. 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 Please contact the patient to schedule If this is negative would simply observe and if evidence of further rebleeding pursue deep enteroscopy anterograde with Dr. Malissa Hippo

## 2016-03-19 NOTE — Telephone Encounter (Signed)
Pt scheduled for meckel's diverticulum scan at [facility] 03/27/16@11am , pt to arrive there at 10:45am. Pt aware of appt.

## 2016-03-27 ENCOUNTER — Ambulatory Visit (HOSPITAL_COMMUNITY)
Admission: RE | Admit: 2016-03-27 | Discharge: 2016-03-27 | Disposition: A | Discharge status: Home / Self Care | Payer: 59 | Source: Ambulatory Visit | Attending: Internal Medicine | Admitting: Internal Medicine

## 2016-03-27 ENCOUNTER — Other Ambulatory Visit: Payer: Self-pay

## 2016-03-27 DIAGNOSIS — K921 Melena: Secondary | ICD-10-CM | POA: Diagnosis present

## 2016-03-27 DIAGNOSIS — D508 Other iron deficiency anemias: Secondary | ICD-10-CM

## 2016-03-27 MED ORDER — SODIUM PERTECHNETATE TC 99M INJECTION
10.0000 | Freq: Once | INTRAVENOUS | Status: AC | PRN
  Administered 2016-03-27: 10 via INTRAVENOUS

## 2016-03-28 ENCOUNTER — Other Ambulatory Visit (INDEPENDENT_AMBULATORY_CARE_PROVIDER_SITE_OTHER): Payer: 59

## 2016-03-28 DIAGNOSIS — D508 Other iron deficiency anemias: Secondary | ICD-10-CM

## 2016-03-28 LAB — CBC WITH DIFFERENTIAL/PLATELET
BASOS PCT: 0.1 % (ref 0.0–3.0)
Basophils Absolute: 0 10*3/uL (ref 0.0–0.1)
EOS PCT: 2 % (ref 0.0–5.0)
Eosinophils Absolute: 0.1 10*3/uL (ref 0.0–0.7)
HEMATOCRIT: 35 % — AB (ref 39.0–52.0)
Hemoglobin: 11.1 g/dL — ABNORMAL LOW (ref 13.0–17.0)
LYMPHS ABS: 2 10*3/uL (ref 0.7–4.0)
LYMPHS PCT: 28.3 % (ref 12.0–46.0)
MCHC: 31.7 g/dL (ref 30.0–36.0)
MCV: 73.5 fl — AB (ref 78.0–100.0)
MONOS PCT: 10.6 % (ref 3.0–12.0)
Monocytes Absolute: 0.8 10*3/uL (ref 0.1–1.0)
NEUTROS ABS: 4.3 10*3/uL (ref 1.4–7.7)
NEUTROS PCT: 59 % (ref 43.0–77.0)
PLATELETS: 257 10*3/uL (ref 150.0–400.0)
RBC: 4.77 Mil/uL (ref 4.22–5.81)
RDW: 19.6 % — AB (ref 11.5–15.5)
WBC: 7.2 10*3/uL (ref 4.0–10.5)

## 2016-03-28 LAB — FERRITIN: Ferritin: 3.2 ng/mL — ABNORMAL LOW (ref 22.0–322.0)

## 2016-03-29 LAB — IBC PANEL
Iron: 17 ug/dL — ABNORMAL LOW (ref 42–165)
Saturation Ratios: 3.5 % — ABNORMAL LOW (ref 20.0–50.0)
Transferrin: 347 mg/dL (ref 212.0–360.0)

## 2016-04-02 ENCOUNTER — Encounter: Payer: Self-pay | Admitting: *Deleted

## 2016-04-17 ENCOUNTER — Encounter: Payer: Self-pay | Admitting: Internal Medicine

## 2016-04-17 ENCOUNTER — Other Ambulatory Visit (INDEPENDENT_AMBULATORY_CARE_PROVIDER_SITE_OTHER): Payer: 59

## 2016-04-17 ENCOUNTER — Ambulatory Visit (INDEPENDENT_AMBULATORY_CARE_PROVIDER_SITE_OTHER): Payer: 59 | Admitting: Internal Medicine

## 2016-04-17 VITALS — BP 110/76 | HR 60 | Ht 70.0 in | Wt 211.2 lb

## 2016-04-17 DIAGNOSIS — R195 Other fecal abnormalities: Secondary | ICD-10-CM | POA: Diagnosis not present

## 2016-04-17 DIAGNOSIS — D508 Other iron deficiency anemias: Secondary | ICD-10-CM | POA: Diagnosis not present

## 2016-04-17 LAB — IGA: IgA: 308 mg/dL (ref 68–378)

## 2016-04-17 MED ORDER — SODIUM CHLORIDE 0.9 % IV SOLN: 510.0000 mg | INTRAVENOUS | Status: AC

## 2016-04-17 NOTE — Patient Instructions (Signed)
Go to the basement for labs today  We will schedule you for an IV Iron infusion we will contact you with that appointment     CAPSULE ENDOSCOPY PATIENT INSTRUCTION SHEET  Kent Montgomery 1955/10/23 [id number]   1. 04/23/2016 Seven (7) days prior to capsule endoscopy stop taking iron supplements and carafate.  2. 04/27/2016 Two (2) days prior to capsule endoscopy stop taking aspirin or any arthritis drugs.  3. 04/28/2016 Day before capsule endoscopy purchase a 238 gram bottle of Miralax from the laxative section of your drug store, and a 32 oz. bottle of Gatorade (no red).    4. 04/28/2016 One (1) day prior to capsule endoscopy: a) Stop smoking. b) Eat a regular diet until 12:00 Noon. c) After 12:00 Noon take only the following: Black coffee  Jell-O (no fruit or red Jell-o) Water   Bouillon (chicken or beef) 7-Up   Cranberry Juice Tea   Kool-Aid Popsicle (not red) Sprite   Coke Ginger Ale  Pepsi Mountain Dew Gatorade d) At 6:00 pm the evening before your appointment, drink 7 capfuls (105 grams) of Miralax with 32 oz. Gatorade. Drink 8 oz every 15 minutes until gone. e) Nothing to eat or drink after midnight except medications with a sip of water.  5. 04/29/2016 Day of capsule endoscopy: Wear loose two-piece clothing to your exam. No medications for 2 hours prior to your test.  6. Please arrive at [facility]  3rd floor patient registration area by 8am on: 04/29/2016.   For any questions: Call [place] at [phone] and ask to speak with one of the capsule endoscopy nurses.

## 2016-04-18 LAB — TISSUE TRANSGLUTAMINASE, IGA: Tissue Transglutaminase Ab, IgA: 1 U/mL (ref <4)

## 2016-04-18 NOTE — Progress Notes (Signed)
Subjective:    Patient ID: Kent Montgomery, male    DOB: Nov 13, 1955, 60 y.o.   MRN: [id number]  HPI Kent Montgomery is a 60 year old male with a past medical history of colon polyps, GERD, sigmoid diverticulosis and recent hospitalization with occult GI bleeding requiring blood transfusion who is seen in follow-up. He was hospitalized in early August 2017 after developing hematochezia.  He underwent EGD, colonoscopy and then VCE.  VCE showed bleeding in the distal small bowel.  His bleeding stopped and blood counts stabilized.  He was referred to Dr. Ralph Dowdy at [facility] for retrograde double balloon enteroscopy. This was performed and unremarkable. Dr. Malissa Hippo recommended Meckel's scan which was performed and negative. He follows up today. He had labs earlier this month which showed improving hemoglobin to 11.1 but iron studies which remained very low. He started on oral iron.  He denies seeing any further bleeding. He has had fatigue and feels tired at the end of the workday. He denies abdominal pain. No nausea and vomiting. No recent recent blood in stool or melena.  Past Medical History:  Diagnosis Date  . Allergy   . Colon polyp   . Elevated transaminase level   . GERD (gastroesophageal reflux disease)   . Hyperlipidemia   . Lower GI bleed   . Sigmoid diverticulosis   . Skin cancer    basil cell rt arm  . Symptomatic anemia    Review of Systems As per history of present illness, otherwise negative  Current Medications, Allergies, Past Medical History, Past Surgical History, Family History and Social History were reviewed in [employer] record.     Objective:   Physical Exam BP 110/76   Pulse 60   Ht 5\' 10"  (1.778 m)   Wt 211 lb 4 oz (95.8 kg)   BMI 30.31 kg/m  Constitutional: Well-developed and well-nourished. No distress. HEENT: Normocephalic and atraumatic.  Conjunctivae are normal.  No scleral icterus. Neck: Neck supple. Trachea  midline. Cardiovascular: Normal rate, regular rhythm and intact distal pulses. No M/R/G Pulmonary/chest: Effort normal and breath sounds normal. No wheezing, rales or rhonchi. Abdominal: Soft, nontender, nondistended. Bowel sounds active throughout. There are no masses palpable. No hepatosplenomegaly. Extremities: no clubbing, cyanosis, or edema Neurological: Alert and oriented to person place and time. Skin: Skin is warm and dry. No rashes noted. Psychiatric: Normal mood and affect. Behavior is normal.  CBC    Component Value Date/Time   WBC 7.2 03/28/2016 1635   RBC 4.77 03/28/2016 1635   HGB 11.1 (L) 03/28/2016 1635   HCT 35.0 (L) 03/28/2016 1635   PLT 257.0 03/28/2016 1635   MCV 73.5 (L) 03/28/2016 1635   MCH 29.5 01/30/2016 0535   MCHC 31.7 03/28/2016 1635   RDW 19.6 (H) 03/28/2016 1635   LYMPHSABS 2.0 03/28/2016 1635   MONOABS 0.8 03/28/2016 1635   EOSABS 0.1 03/28/2016 1635   BASOSABS 0.0 03/28/2016 1635   Iron/TIBC/Ferritin/ %Sat    Component Value Date/Time   IRON 17 (L) 03/28/2016 1635   TIBC 290 01/27/2016 1003   FERRITIN 3.2 (L) 03/28/2016 1635   IRONPCTSAT 3.5 (L) 03/28/2016 1635       Assessment & Plan:  60 year old male with a past medical history of colon polyps, GERD, sigmoid diverticulosis and recent hospitalization with occult GI bleeding requiring blood transfusion who is seen in follow-up.  1. Occult GI bleeding, most likely small bowel bleeding/iron deficiency anemia -- it makes him quite nervous to simply  wait for any additional bleeding. He did have a hemodynamically significant bleed requiring blood transfusion. I recommended IV iron infusion to supplement iron fully and improve blood counts. He can continue oral iron for now as well. We will repeat a video capsule endoscopy study to try to localize and better determine if there are bleeding lesions in the bowel. At present he would be a candidate for anterior grade double-balloon enteroscopy versus  Spirus enteroscopy --Check celiac panel  25 minutes spent with the patient today. Greater than 50% was spent in counseling and coordination of care with the patient

## 2016-04-19 ENCOUNTER — Telehealth: Payer: Self-pay | Admitting: *Deleted

## 2016-04-19 NOTE — Telephone Encounter (Signed)
L/m for patient, IV Iron is scheduled at [facility] on 05/06/2016 at 10am  And Monday 05/13/2016 at 11am

## 2016-04-29 ENCOUNTER — Encounter: Payer: Self-pay | Admitting: Internal Medicine

## 2016-04-29 ENCOUNTER — Ambulatory Visit (INDEPENDENT_AMBULATORY_CARE_PROVIDER_SITE_OTHER): Payer: 59 | Admitting: Internal Medicine

## 2016-04-29 DIAGNOSIS — R195 Other fecal abnormalities: Secondary | ICD-10-CM | POA: Diagnosis not present

## 2016-04-29 NOTE — Progress Notes (Signed)
Patient here for capsule endoscopy. Tolerated procedure. Verbalizes understanding of written and verbal instructions. Capsule Id# [id number] Lot# [id number] Exp. 05/30/2017

## 2016-04-30 ENCOUNTER — Other Ambulatory Visit: Payer: Self-pay

## 2016-04-30 DIAGNOSIS — D508 Other iron deficiency anemias: Secondary | ICD-10-CM

## 2016-05-01 ENCOUNTER — Other Ambulatory Visit: Payer: Self-pay

## 2016-05-01 DIAGNOSIS — D508 Other iron deficiency anemias: Secondary | ICD-10-CM

## 2016-05-06 ENCOUNTER — Telehealth: Payer: Self-pay | Admitting: Internal Medicine

## 2016-05-06 ENCOUNTER — Other Ambulatory Visit: Payer: Self-pay

## 2016-05-06 ENCOUNTER — Encounter (HOSPITAL_COMMUNITY): Payer: Self-pay

## 2016-05-06 ENCOUNTER — Encounter (HOSPITAL_COMMUNITY)
Admission: RE | Admit: 2016-05-06 | Discharge: 2016-05-06 | Disposition: A | Discharge status: Home / Self Care | Payer: 59 | Source: Ambulatory Visit | Attending: Internal Medicine | Admitting: Internal Medicine

## 2016-05-06 DIAGNOSIS — T184XXA Foreign body in colon, initial encounter: Secondary | ICD-10-CM

## 2016-05-06 DIAGNOSIS — D509 Iron deficiency anemia, unspecified: Secondary | ICD-10-CM | POA: Insufficient documentation

## 2016-05-06 DIAGNOSIS — D508 Other iron deficiency anemias: Secondary | ICD-10-CM

## 2016-05-06 MED ORDER — SODIUM CHLORIDE 0.9 % IV SOLN
Freq: Once | INTRAVENOUS | Status: AC
  Administered 2016-05-06: 11:00:00 via INTRAVENOUS

## 2016-05-06 MED ORDER — SODIUM CHLORIDE 0.9 % IV SOLN
510.0000 mg | INTRAVENOUS | Status: DC
  Administered 2016-05-06: 510 mg via INTRAVENOUS
  Filled 2016-05-06: qty 17

## 2016-05-06 NOTE — Discharge Instructions (Signed)
Ferumoxytol injection / Infusion What is this medicine? FERUMOXYTOL is an iron complex. Iron is used to make healthy red blood cells, which carry oxygen and nutrients throughout the body. This medicine is used to treat iron deficiency anemia in people with chronic kidney disease. This medicine may be used for other purposes; ask your health care provider or pharmacist if you have questions. What should I tell my health care provider before I take this medicine? They need to know if you have any of these conditions: -anemia not caused by low iron levels -high levels of iron in the blood -magnetic resonance imaging (MRI) test scheduled -an unusual or allergic reaction to iron, other medicines, foods, dyes, or preservatives -pregnant or trying to get pregnant -breast-feeding How should I use this medicine? This medicine is for injection into a vein. It is given by a health care professional in a hospital or clinic setting. Talk to your pediatrician regarding the use of this medicine in children. Special care may be needed. Overdosage: If you think you have taken too much of this medicine contact a poison control center or emergency room at once. NOTE: This medicine is only for you. Do not share this medicine with others. What if I miss a dose? It is important not to miss your dose. Call your doctor or health care professional if you are unable to keep an appointment. What may interact with this medicine? This medicine may interact with the following medications: -other iron products This list may not describe all possible interactions. Give your health care provider a list of all the medicines, herbs, non-prescription drugs, or dietary supplements you use. Also tell them if you smoke, drink alcohol, or use illegal drugs. Some items may interact with your medicine. What should I watch for while using this medicine? Visit your doctor or healthcare professional regularly. Tell your doctor or  healthcare professional if your symptoms do not start to get better or if they get worse. You may need blood work done while you are taking this medicine. You may need to follow a special diet. Talk to your doctor. Foods that contain iron include: whole grains/cereals, dried fruits, beans, or peas, leafy green vegetables, and organ meats (liver, kidney). What side effects may I notice from receiving this medicine? Side effects that you should report to your doctor or health care professional as soon as possible: -allergic reactions like skin rash, itching or hives, swelling of the face, lips, or tongue -breathing problems -changes in blood pressure -feeling faint or lightheaded, falls -fever or chills -flushing, sweating, or hot feelings -swelling of the ankles or feet Side effects that usually do not require medical attention (Report these to your doctor or health care professional if they continue or are bothersome.): -diarrhea -headache -nausea, vomiting -stomach pain This list may not describe all possible side effects. Call your doctor for medical advice about side effects. You may report side effects to FDA at 1-800-FDA-1088. Where should I keep my medicine? This drug is given in a hospital or clinic and will not be stored at home. NOTE: This sheet is a summary. It may not cover all possible information. If you have questions about this medicine, talk to your doctor, pharmacist, or health care provider.    2016, Elsevier/Gold Standard. (2012-01-24 15:23:36)

## 2016-05-06 NOTE — Telephone Encounter (Signed)
Pt will come in for KUB to see if capsule has passed. Pt aware of capsule endo results per Dr. Hilarie Fredrickson. Pt to come for labs, CRP and IBD serology panel. Pt instructed to continue oral iron as per last OV note.

## 2016-05-08 ENCOUNTER — Ambulatory Visit (INDEPENDENT_AMBULATORY_CARE_PROVIDER_SITE_OTHER)
Admission: RE | Admit: 2016-05-08 | Discharge: 2016-05-08 | Disposition: A | Discharge status: Home / Self Care | Payer: 59 | Source: Ambulatory Visit | Attending: Internal Medicine | Admitting: Internal Medicine

## 2016-05-08 ENCOUNTER — Other Ambulatory Visit (INDEPENDENT_AMBULATORY_CARE_PROVIDER_SITE_OTHER): Payer: 59

## 2016-05-08 DIAGNOSIS — D509 Iron deficiency anemia, unspecified: Secondary | ICD-10-CM | POA: Diagnosis not present

## 2016-05-08 DIAGNOSIS — T184XXA Foreign body in colon, initial encounter: Secondary | ICD-10-CM | POA: Diagnosis not present

## 2016-05-08 DIAGNOSIS — K5900 Constipation, unspecified: Secondary | ICD-10-CM | POA: Diagnosis not present

## 2016-05-08 LAB — HIGH SENSITIVITY CRP: CRP, High Sensitivity: 1.33 mg/L (ref 0.000–5.000)

## 2016-05-09 LAB — IBD EXPANDED PANEL
ACCA: 25 U (ref 0–90)
ALCA: 62 U — AB (ref 0–60)
AMCA: 36 U (ref 0–100)
ATYPICAL PANCA: NEGATIVE
GASCA: 8 U (ref 0–50)

## 2016-05-13 ENCOUNTER — Encounter (HOSPITAL_COMMUNITY): Payer: Self-pay

## 2016-05-13 ENCOUNTER — Encounter (HOSPITAL_COMMUNITY)
Admission: RE | Admit: 2016-05-13 | Discharge: 2016-05-13 | Disposition: A | Discharge status: Home / Self Care | Payer: 59 | Source: Ambulatory Visit | Attending: Internal Medicine | Admitting: Internal Medicine

## 2016-05-13 ENCOUNTER — Telehealth: Payer: Self-pay | Admitting: Internal Medicine

## 2016-05-13 DIAGNOSIS — D509 Iron deficiency anemia, unspecified: Secondary | ICD-10-CM | POA: Diagnosis not present

## 2016-05-13 DIAGNOSIS — D508 Other iron deficiency anemias: Secondary | ICD-10-CM

## 2016-05-13 MED ORDER — SODIUM CHLORIDE 0.9 % IV SOLN
INTRAVENOUS | Status: DC
  Administered 2016-05-13: 15:00:00 via INTRAVENOUS

## 2016-05-13 MED ORDER — SODIUM CHLORIDE 0.9 % IV SOLN
510.0000 mg | INTRAVENOUS | Status: AC
  Administered 2016-05-13: 510 mg via INTRAVENOUS
  Filled 2016-05-13: qty 17

## 2016-05-13 NOTE — Telephone Encounter (Signed)
Discussion with patient by phone regarding recent repeat video capsule endoscopy, IBD expanded panel and CRP lab tests. The video capsule endoscopy showed shallow aphthous appearing ulcerations in the distal ileum near the terminal ileum. There was no evidence of active bleeding. Of note ulcerations were not noted at the time of his retrograde double balloon enteroscopy at [facility] about 7 weeks before CRP was normal IBD expanded panel suggested Crohn's based on a low level + ALCA He is not having enteritis or Crohn's symptoms such as abdominal pain diarrhea etc. He has not had any further overt bleeding He had his second IV iron dose today  We discussed that overt GI hemorrhage requiring blood transfusion such as he had in August 2017 is very atypical of Crohn's disease. Despite the positive blood test and capsule findings I remain very skeptical of Crohn's, and do not feel that he has Crohn's Again Crohn's was not suggested by retrograde double balloon enteroscopy  We discussed options including repeating colonoscopy with ileal intubation to look for these ulcers and biopsy. Biopsy of active inflammation/ulcers would provide definitive diagnosis of IBD if it were present. This approach versus observation. After this discussion both he and I are comfortable with and have decided to:  1. Repeat CBC, ferritin IBC panel 8 weeks from today 2. Stop oral iron 3. Notify me immediately of any overt bleeding 4. If he remains persistently iron deficient or becomes iron deficient again in the future, repeat colonoscopy with ileal intubation will be recommended  He is happy with this plan

## 2016-05-14 ENCOUNTER — Other Ambulatory Visit: Payer: Self-pay

## 2016-05-14 DIAGNOSIS — D508 Other iron deficiency anemias: Secondary | ICD-10-CM

## 2016-05-14 NOTE — Telephone Encounter (Signed)
Orders and reminder in epic. 

## 2016-06-25 ENCOUNTER — Ambulatory Visit: Payer: 59 | Admitting: Internal Medicine

## 2016-07-18 ENCOUNTER — Other Ambulatory Visit (INDEPENDENT_AMBULATORY_CARE_PROVIDER_SITE_OTHER): Payer: 59

## 2016-07-18 DIAGNOSIS — D508 Other iron deficiency anemias: Secondary | ICD-10-CM | POA: Diagnosis not present

## 2016-07-18 LAB — CBC WITH DIFFERENTIAL/PLATELET
Basophils Absolute: 0 10*3/uL (ref 0.0–0.1)
Basophils Relative: 0.3 % (ref 0.0–3.0)
Eosinophils Absolute: 0.3 10*3/uL (ref 0.0–0.7)
Eosinophils Relative: 3.2 % (ref 0.0–5.0)
HCT: 45.3 % (ref 39.0–52.0)
Hemoglobin: 15.6 g/dL (ref 13.0–17.0)
LYMPHS ABS: 2.5 10*3/uL (ref 0.7–4.0)
Lymphocytes Relative: 26.5 % (ref 12.0–46.0)
MCHC: 34.5 g/dL (ref 30.0–36.0)
MCV: 84.6 fl (ref 78.0–100.0)
MONOS PCT: 7.3 % (ref 3.0–12.0)
Monocytes Absolute: 0.7 10*3/uL (ref 0.1–1.0)
NEUTROS PCT: 62.7 % (ref 43.0–77.0)
Neutro Abs: 5.8 10*3/uL (ref 1.4–7.7)
PLATELETS: 203 10*3/uL (ref 150.0–400.0)
RBC: 5.36 Mil/uL (ref 4.22–5.81)
RDW: 21.5 % — ABNORMAL HIGH (ref 11.5–15.5)
WBC: 9.3 10*3/uL (ref 4.0–10.5)

## 2016-07-18 LAB — IBC PANEL
Iron: 100 ug/dL (ref 42–165)
SATURATION RATIOS: 27.5 % (ref 20.0–50.0)
Transferrin: 260 mg/dL (ref 212.0–360.0)

## 2016-07-18 LAB — FERRITIN: Ferritin: 49.3 ng/mL (ref 22.0–322.0)

## 2016-07-23 ENCOUNTER — Other Ambulatory Visit: Payer: Self-pay

## 2016-07-23 DIAGNOSIS — D509 Iron deficiency anemia, unspecified: Secondary | ICD-10-CM

## 2016-07-26 ENCOUNTER — Encounter: Payer: Self-pay | Admitting: Internal Medicine

## 2016-10-01 ENCOUNTER — Telehealth: Payer: Self-pay | Admitting: Family

## 2016-10-01 NOTE — Telephone Encounter (Signed)
T/c to [place] for verbal order of ertyhromycin ointment to both eyes BID for 7-10 days # 1 tube with 1 RF.

## 2017-01-23 ENCOUNTER — Other Ambulatory Visit (INDEPENDENT_AMBULATORY_CARE_PROVIDER_SITE_OTHER): Payer: 59

## 2017-01-23 DIAGNOSIS — D509 Iron deficiency anemia, unspecified: Secondary | ICD-10-CM | POA: Diagnosis not present

## 2017-01-23 LAB — CBC WITH DIFFERENTIAL/PLATELET
BASOS ABS: 0 10*3/uL (ref 0.0–0.1)
Basophils Relative: 0.6 % (ref 0.0–3.0)
Eosinophils Absolute: 0.1 10*3/uL (ref 0.0–0.7)
Eosinophils Relative: 2 % (ref 0.0–5.0)
HCT: 47.3 % (ref 39.0–52.0)
HEMOGLOBIN: 16 g/dL (ref 13.0–17.0)
LYMPHS ABS: 1.9 10*3/uL (ref 0.7–4.0)
Lymphocytes Relative: 28 % (ref 12.0–46.0)
MCHC: 33.9 g/dL (ref 30.0–36.0)
MCV: 92.3 fl (ref 78.0–100.0)
MONO ABS: 0.6 10*3/uL (ref 0.1–1.0)
Monocytes Relative: 8.5 % (ref 3.0–12.0)
NEUTROS PCT: 60.9 % (ref 43.0–77.0)
Neutro Abs: 4 10*3/uL (ref 1.4–7.7)
PLATELETS: 192 10*3/uL (ref 150.0–400.0)
RBC: 5.12 Mil/uL (ref 4.22–5.81)
RDW: 13.7 % (ref 11.5–15.5)
WBC: 6.6 10*3/uL (ref 4.0–10.5)

## 2017-01-24 LAB — IBC PANEL
Iron: 124 ug/dL (ref 42–165)
Saturation Ratios: 32.9 % (ref 20.0–50.0)
TRANSFERRIN: 269 mg/dL (ref 212.0–360.0)

## 2017-01-24 LAB — FERRITIN: FERRITIN: 37.7 ng/mL (ref 22.0–322.0)

## 2017-09-01 DIAGNOSIS — H524 Presbyopia: Secondary | ICD-10-CM | POA: Diagnosis not present

## 2017-09-08 DIAGNOSIS — L738 Other specified follicular disorders: Secondary | ICD-10-CM | POA: Diagnosis not present

## 2017-09-08 DIAGNOSIS — D1721 Benign lipomatous neoplasm of skin and subcutaneous tissue of right arm: Secondary | ICD-10-CM | POA: Diagnosis not present

## 2017-09-08 DIAGNOSIS — D2262 Melanocytic nevi of left upper limb, including shoulder: Secondary | ICD-10-CM | POA: Diagnosis not present

## 2017-09-08 DIAGNOSIS — L720 Epidermal cyst: Secondary | ICD-10-CM | POA: Diagnosis not present

## 2017-09-08 DIAGNOSIS — L821 Other seborrheic keratosis: Secondary | ICD-10-CM | POA: Diagnosis not present

## 2017-09-08 DIAGNOSIS — Z85828 Personal history of other malignant neoplasm of skin: Secondary | ICD-10-CM | POA: Diagnosis not present

## 2017-09-08 DIAGNOSIS — D1801 Hemangioma of skin and subcutaneous tissue: Secondary | ICD-10-CM | POA: Diagnosis not present

## 2017-09-15 DIAGNOSIS — R82998 Other abnormal findings in urine: Secondary | ICD-10-CM | POA: Diagnosis not present

## 2017-09-15 DIAGNOSIS — Z125 Encounter for screening for malignant neoplasm of prostate: Secondary | ICD-10-CM | POA: Diagnosis not present

## 2017-09-15 DIAGNOSIS — Z0000 Encounter for general adult medical examination without abnormal findings: Secondary | ICD-10-CM | POA: Diagnosis not present

## 2017-09-22 DIAGNOSIS — Z125 Encounter for screening for malignant neoplasm of prostate: Secondary | ICD-10-CM | POA: Diagnosis not present

## 2017-09-22 DIAGNOSIS — E7800 Pure hypercholesterolemia, unspecified: Secondary | ICD-10-CM | POA: Diagnosis not present

## 2017-09-22 DIAGNOSIS — J302 Other seasonal allergic rhinitis: Secondary | ICD-10-CM | POA: Diagnosis not present

## 2017-09-22 DIAGNOSIS — D126 Benign neoplasm of colon, unspecified: Secondary | ICD-10-CM | POA: Diagnosis not present

## 2017-09-22 DIAGNOSIS — Z1389 Encounter for screening for other disorder: Secondary | ICD-10-CM | POA: Diagnosis not present

## 2017-09-22 DIAGNOSIS — Z0000 Encounter for general adult medical examination without abnormal findings: Secondary | ICD-10-CM | POA: Diagnosis not present

## 2017-09-22 DIAGNOSIS — Z6829 Body mass index (BMI) 29.0-29.9, adult: Secondary | ICD-10-CM | POA: Diagnosis not present

## 2017-09-22 DIAGNOSIS — G4733 Obstructive sleep apnea (adult) (pediatric): Secondary | ICD-10-CM | POA: Diagnosis not present

## 2017-10-07 DIAGNOSIS — Z8719 Personal history of other diseases of the digestive system: Secondary | ICD-10-CM | POA: Diagnosis not present

## 2017-10-07 DIAGNOSIS — R222 Localized swelling, mass and lump, trunk: Secondary | ICD-10-CM | POA: Diagnosis not present

## 2017-10-07 DIAGNOSIS — Z9889 Other specified postprocedural states: Secondary | ICD-10-CM | POA: Diagnosis not present

## 2017-10-09 ENCOUNTER — Other Ambulatory Visit: Payer: Self-pay | Admitting: General Surgery

## 2017-10-09 DIAGNOSIS — R222 Localized swelling, mass and lump, trunk: Secondary | ICD-10-CM

## 2017-10-20 ENCOUNTER — Ambulatory Visit
Admission: RE | Admit: 2017-10-20 | Discharge: 2017-10-20 | Disposition: A | Discharge status: Home / Self Care | Payer: 59 | Source: Ambulatory Visit | Attending: General Surgery | Admitting: General Surgery

## 2017-10-20 ENCOUNTER — Other Ambulatory Visit: Payer: Self-pay | Admitting: General Surgery

## 2017-10-20 DIAGNOSIS — M799 Soft tissue disorder, unspecified: Secondary | ICD-10-CM | POA: Diagnosis not present

## 2017-10-20 DIAGNOSIS — R222 Localized swelling, mass and lump, trunk: Secondary | ICD-10-CM

## 2017-10-20 MED ORDER — GADOBENATE DIMEGLUMINE 529 MG/ML IV SOLN
20.0000 mL | Freq: Once | INTRAVENOUS | Status: AC | PRN
  Administered 2017-10-20: 20 mL via INTRAVENOUS

## 2017-11-06 ENCOUNTER — Other Ambulatory Visit: Payer: Self-pay | Admitting: General Surgery

## 2017-11-06 DIAGNOSIS — R222 Localized swelling, mass and lump, trunk: Secondary | ICD-10-CM | POA: Diagnosis not present

## 2017-12-11 DIAGNOSIS — M549 Dorsalgia, unspecified: Secondary | ICD-10-CM | POA: Diagnosis not present

## 2017-12-11 DIAGNOSIS — M5489 Other dorsalgia: Secondary | ICD-10-CM | POA: Diagnosis not present

## 2017-12-11 DIAGNOSIS — Z6829 Body mass index (BMI) 29.0-29.9, adult: Secondary | ICD-10-CM | POA: Diagnosis not present

## 2017-12-29 NOTE — Progress Notes (Signed)
09-22-17 EKG on chart

## 2017-12-29 NOTE — Patient Instructions (Addendum)
Kent Montgomery  12/29/2017   Your procedure is scheduled on: 01-22-18   Report to [facility] Main  Entrance     Report to Admitting at 5:30 AM    Call this number if you have problems the morning of surgery [phone]    Remember: NO SOLID FOOD AFTER MIDNIGHT THE NIGHT PRIOR TO SURGERY. NOTHING BY MOUTH EXCEPT CLEAR LIQUIDS UNTIL 3 HOURS PRIOR TO [place] SURGERY. PLEASE FINISH ENSURE DRINK PER SURGEON ORDER 3 HOURS PRIOR TO SCHEDULED SURGERY TIME WHICH NEEDS TO BE COMPLETED AT 4:30 AM.    CLEAR LIQUID DIET   Foods Allowed                                                                     Foods Excluded  Coffee and tea, regular and decaf                             liquids that you cannot  Plain Jell-O in any flavor                                             see through such as: Fruit ices (not with fruit pulp)                                     milk, soups, orange juice  Iced Popsicles                                    All solid food Carbonated beverages, regular and diet                                    Cranberry, grape and apple juices Sports drinks like Gatorade Lightly seasoned clear broth or consume(fat free) Sugar, honey syrup  Sample Menu Breakfast                                Lunch                                     Supper Cranberry juice                    Beef broth                            Chicken broth Jell-O                                     Grape juice  Apple juice Coffee or tea                        Jell-O                                      Popsicle                                                Coffee or tea                        Coffee or tea  _____________________________________________________________________    Take these medicines the morning of surgery with A SIP OF WATER: None                                 You may not have any metal on your body including hair pins and       piercings  Do not wear jewelry, lotions, powders or deodorant             Men may shave face and neck.   Do not bring valuables to the hospital. [place].  Contacts, dentures or bridgework may not be worn into surgery.  Leave suitcase in the car. After surgery it may be brought to your room.     Patients discharged the day of surgery will not be allowed to drive home.  Name and phone number of your driver: Parks Neptune [phone]                Please read over the following fact sheets you were given: _____________________________________________________________________             [facility] - Preparing for Surgery Before surgery, you can play an important role.  Because skin is not sterile, your skin needs to be as free of germs as possible.  You can reduce the number of germs on your skin by washing with CHG (chlorahexidine gluconate) soap before surgery.  CHG is an antiseptic cleaner which kills germs and bonds with the skin to continue killing germs even after washing. Please DO NOT use if you have an allergy to CHG or antibacterial soaps.  If your skin becomes reddened/irritated stop using the CHG and inform your nurse when you arrive at Short Stay. Do not shave (including legs and underarms) for at least 48 hours prior to the first CHG shower.  You may shave your face/neck. Please follow these instructions carefully:  1.  Shower with CHG Soap the night before surgery and the  morning of Surgery.  2.  If you choose to wash your hair, wash your hair first as usual with your  normal  shampoo.  3.  After you shampoo, rinse your hair and body thoroughly to remove the  shampoo.                           4.  Use CHG as you would any other liquid soap.  You can apply chg directly  to the skin  and wash                       Gently with a scrungie or clean washcloth.  5.  Apply the CHG Soap to your body ONLY FROM THE NECK DOWN.   Do  not use on face/ open                           Wound or open sores. Avoid contact with eyes, ears mouth and genitals (private parts).                       Wash face,  Genitals (private parts) with your normal soap.             6.  Wash thoroughly, paying special attention to the area where your surgery  will be performed.  7.  Thoroughly rinse your body with warm water from the neck down.  8.  DO NOT shower/wash with your normal soap after using and rinsing off  the CHG Soap.                9.  Pat yourself dry with a clean towel.            10.  Wear clean pajamas.            11.  Place clean sheets on your bed the night of your first shower and do not  sleep with pets. Day of Surgery : Do not apply any lotions/deodorants the morning of surgery.  Please wear clean clothes to the hospital/surgery center.  FAILURE TO FOLLOW THESE INSTRUCTIONS MAY RESULT IN THE CANCELLATION OF YOUR SURGERY PATIENT SIGNATURE_________________________________  NURSE SIGNATURE__________________________________  ________________________________________________________________________

## 2018-01-09 ENCOUNTER — Encounter (HOSPITAL_COMMUNITY)
Admission: RE | Admit: 2018-01-09 | Discharge: 2018-01-09 | Disposition: A | Discharge status: Home / Self Care | Payer: 59 | Source: Ambulatory Visit | Attending: General Surgery | Admitting: General Surgery

## 2018-01-09 ENCOUNTER — Encounter (HOSPITAL_COMMUNITY): Payer: Self-pay

## 2018-01-09 ENCOUNTER — Other Ambulatory Visit: Payer: Self-pay

## 2018-01-09 DIAGNOSIS — D179 Benign lipomatous neoplasm, unspecified: Secondary | ICD-10-CM | POA: Insufficient documentation

## 2018-01-09 DIAGNOSIS — Z01818 Encounter for other preprocedural examination: Secondary | ICD-10-CM | POA: Diagnosis present

## 2018-01-09 LAB — CBC
HCT: 45.6 % (ref 39.0–52.0)
Hemoglobin: 15.4 g/dL (ref 13.0–17.0)
MCH: 29.8 pg (ref 26.0–34.0)
MCHC: 33.8 g/dL (ref 30.0–36.0)
MCV: 88.4 fL (ref 78.0–100.0)
Platelets: 246 10*3/uL (ref 150–400)
RBC: 5.16 MIL/uL (ref 4.22–5.81)
RDW: 12.6 % (ref 11.5–15.5)
WBC: 5.3 10*3/uL (ref 4.0–10.5)

## 2018-01-09 LAB — BASIC METABOLIC PANEL
Anion gap: 6 (ref 5–15)
BUN: 20 mg/dL (ref 6–20)
CALCIUM: 8.9 mg/dL (ref 8.9–10.3)
CO2: 27 mmol/L (ref 22–32)
Chloride: 103 mmol/L (ref 98–111)
Creatinine, Ser: 1.17 mg/dL (ref 0.61–1.24)
GFR calc Af Amer: >60 mL/min (ref >60)
GLUCOSE: 104 mg/dL — AB (ref 70–99)
Potassium: 4.8 mmol/L (ref 3.5–5.1)
Sodium: 136 mmol/L (ref 135–145)

## 2018-01-18 NOTE — H&P (Signed)
[name] Location: [facility] Patient #: [id number] DOB: 1956-05-10 Married / Language: English / Race: White Male        History of Present Illness       The patient is a 62 year old male who presents with a complaint of 8-10 cm lipoma right upper back, intramuscular. This is a very pleasant 62 year old gentleman who returns following MRI to discuss soft tissue mass of the right upper back. Dr. Valere Dross is his PCP. I performed his appendectomy 20 years ago.      MRI shows an 8 cm soft tissue mass, partially deep to the trapezius muscle which looks like a lipoma. Feels larger.   There is a small reactive lymph node in the area. Spinal accessory nerve is a little bit compressed by this. He says this bothers him a lot and would like to have this excised. I think that is reasonable and the risks are fairly low. I told him that this was most likely a benign lipoma.      I told him he would probably be out of work for 5 days and no tenderness or Goff for 1 month. Probably could do some jogging in about 2 weeks. He will be scheduled for excision of 10 cm soft tissue mass right upper back in the near future. This will be done under general anesthesia in the prone position. I discussed the indications, details, techniques, and risk of the surgery with him. He is aware the risk of bleeding, infection, nerve damage with numbness. Rare but possible injury to the spinal accessory nerve which would cause a motor disability. He understands all these issues. All his questions were answered. He agrees with this plan.      Comorbidities include back surgery elbow surgery and appendectomy on the 20 years ago for ruptured appendicitis. Borderline hyperlipidemia. 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 Denies tobacco. Couple glasses of wine at night. Married with 3 children.      We'll schedule this at his  convenience.   Allergies Cefdinir *CEPHALOSPORINS*  Droperidol *ANTIANXIETY AGENTS*  Allergies Reconciled   Medication History  No Current Medications Medications Reconciled  Vitals  Weight: 212.5 lb Height: 70in Body Surface Area: 2.14 m Body Mass Index: 30.49 kg/m  Temp.: 97.25F(Oral)  Pulse: 84 (Regular)  BP: 130/72 (Sitting, Left Arm, Standard)    Physical Exam  General Mental Status-Alert. General Appearance-Not in acute distress. Build & Nutrition-Well nourished. Posture-Normal posture. Gait-Normal.  Head and Neck Head-normocephalic, atraumatic with no lesions or palpable masses. Trachea-midline. Thyroid Gland Characteristics - normal size and consistency and no palpable nodules.  Chest and Lung Exam Chest and lung exam reveals -on auscultation, normal breath sounds, no adventitious sounds and normal vocal resonance. Note: 8-10 cm soft tissue mass right upper back, lateral to spine and medial to upper scapula. Consistent with MRI findings. Feels like a lipoma. Overlying skin healthy. Full range of motion right shoulder.   Cardiovascular Cardiovascular examination reveals -normal heart sounds, regular rate and rhythm with no murmurs and femoral artery auscultation bilaterally reveals normal pulses, no bruits, no thrills.  Abdomen Inspection Inspection of the abdomen reveals - No Hernias. Palpation/Percussion Palpation and Percussion of the abdomen reveal - Soft, Non Tender, No Rigidity (guarding), No hepatosplenomegaly and No Palpable abdominal masses. Note: Well-healed scar from appendectomy   Neurologic Neurologic evaluation reveals -alert and oriented x 3 with no impairment of recent or remote  memory, normal attention span and ability to concentrate, normal sensation and normal coordination.  Musculoskeletal Normal Exam - Bilateral-Upper Extremity Strength Normal and Lower Extremity Strength  Normal.    Assessment & Plan  MASS ON BACK (R22.2)        The MRI shows a 8 cm mass which looks like a benign lipoma. There is a reactive lymph node nearby but I do not think we need to excise this. Perhaps repeat imaging in 6 months would be indicated. The lipoma is deep to the trapezius muscle so we would have to go through the muscle to get the lipoma out. There is a tiny chance we might injure the spinal accessory nerve which goes to the trapezius muscle. Most likely that will not occur. We talked about surgical technique and temporary disabilities. We discussed indications, techniques, and risks in detail. You have requested that we proceed with this surgery and we will schedule at your convenience    Kent Montgomery. Dalbert Batman, M.D., [facility] General and Minimally invasive Surgery Breast and Colorectal Surgery Office:   [phone] Pager:   [phone]

## 2018-01-21 NOTE — Anesthesia Preprocedure Evaluation (Addendum)
Anesthesia Evaluation  Patient identified by MRN, date of birth, ID band Patient awake    Reviewed: Allergy & Precautions, NPO status , Patient's Chart, lab work & pertinent test results  Airway Mallampati: II  TM Distance: >3 FB Neck ROM: Full    Dental  (+) Dental Advisory Given   Pulmonary neg pulmonary ROS,    breath sounds clear to auscultation       Cardiovascular negative cardio ROS   Rhythm:Regular Rate:Normal     Neuro/Psych negative neurological ROS     GI/Hepatic Neg liver ROS, GERD  ,  Endo/Other  negative endocrine ROS  Renal/GU negative Renal ROS     Musculoskeletal   Abdominal   Peds  Hematology negative hematology ROS (+)   Anesthesia Other Findings   Reproductive/Obstetrics                            Lab Results  Component Value Date   WBC 5.3 01/09/2018   HGB 15.4 01/09/2018   HCT 45.6 01/09/2018   MCV 88.4 01/09/2018   PLT 246 01/09/2018   Lab Results  Component Value Date   CREATININE 1.17 01/09/2018   BUN 20 01/09/2018   NA 136 01/09/2018   K 4.8 01/09/2018   CL 103 01/09/2018   CO2 27 01/09/2018    Anesthesia Physical Anesthesia Plan  ASA: I  Anesthesia Plan: General   Post-op Pain Management:    Induction: Intravenous  PONV Risk Score and Plan: 2 and Ondansetron, Dexamethasone and Treatment may vary due to age or medical condition  Airway Management Planned: Oral ETT and LMA  Additional Equipment:   Intra-op Plan:   Post-operative Plan: Extubation in OR  Informed Consent: I have reviewed the patients History and Physical, chart, labs and discussed the procedure including the risks, benefits and alternatives for the proposed anesthesia with the patient or authorized representative who has indicated his/her understanding and acceptance.   Dental advisory given  Plan Discussed with: CRNA  Anesthesia Plan Comments:        Anesthesia  Quick Evaluation

## 2018-01-22 ENCOUNTER — Encounter (HOSPITAL_COMMUNITY): Payer: Self-pay

## 2018-01-22 ENCOUNTER — Ambulatory Visit (HOSPITAL_COMMUNITY): Payer: 59 | Admitting: Anesthesiology

## 2018-01-22 ENCOUNTER — Ambulatory Visit (HOSPITAL_COMMUNITY)
Admission: RE | Admit: 2018-01-22 | Discharge: 2018-01-22 | Disposition: A | Discharge status: Home / Self Care | Payer: 59 | Source: Ambulatory Visit | Attending: General Surgery | Admitting: General Surgery

## 2018-01-22 ENCOUNTER — Encounter (HOSPITAL_COMMUNITY)
Admission: RE | Disposition: A | Discharge status: Home / Self Care | Payer: Self-pay | Source: Ambulatory Visit | Attending: General Surgery

## 2018-01-22 DIAGNOSIS — K219 Gastro-esophageal reflux disease without esophagitis: Secondary | ICD-10-CM | POA: Insufficient documentation

## 2018-01-22 DIAGNOSIS — E785 Hyperlipidemia, unspecified: Secondary | ICD-10-CM | POA: Diagnosis not present

## 2018-01-22 DIAGNOSIS — R222 Localized swelling, mass and lump, trunk: Secondary | ICD-10-CM | POA: Diagnosis not present

## 2018-01-22 DIAGNOSIS — D171 Benign lipomatous neoplasm of skin and subcutaneous tissue of trunk: Secondary | ICD-10-CM | POA: Diagnosis present

## 2018-01-22 DIAGNOSIS — Z79899 Other long term (current) drug therapy: Secondary | ICD-10-CM | POA: Diagnosis not present

## 2018-01-22 DIAGNOSIS — D179 Benign lipomatous neoplasm, unspecified: Secondary | ICD-10-CM | POA: Diagnosis present

## 2018-01-22 DIAGNOSIS — D509 Iron deficiency anemia, unspecified: Secondary | ICD-10-CM | POA: Diagnosis not present

## 2018-01-22 DIAGNOSIS — K922 Gastrointestinal hemorrhage, unspecified: Secondary | ICD-10-CM | POA: Diagnosis not present

## 2018-01-22 HISTORY — PX: MASS EXCISION: SHX2000

## 2018-01-22 SURGERY — EXCISION MASS
Anesthesia: General | Site: Back | Laterality: Right

## 2018-01-22 MED ORDER — VANCOMYCIN HCL IN DEXTROSE 1-5 GM/200ML-% IV SOLN
1000.0000 mg | INTRAVENOUS | Status: AC
  Administered 2018-01-22: 1000 mg via INTRAVENOUS
  Filled 2018-01-22: qty 200

## 2018-01-22 MED ORDER — FENTANYL CITRATE (PF) 100 MCG/2ML IJ SOLN
INTRAMUSCULAR | Status: AC
  Filled 2018-01-22: qty 2

## 2018-01-22 MED ORDER — VANCOMYCIN HCL 1000 MG IV SOLR
INTRAVENOUS | Status: DC | PRN
  Administered 2018-01-22: 1000 mg via INTRAVENOUS

## 2018-01-22 MED ORDER — LIDOCAINE 2% (20 MG/ML) 5 ML SYRINGE
INTRAMUSCULAR | Status: DC | PRN
  Administered 2018-01-22: 60 mg via INTRAVENOUS

## 2018-01-22 MED ORDER — PROMETHAZINE HCL 25 MG/ML IJ SOLN: 6.2500 mg | INTRAMUSCULAR | Status: DC | PRN

## 2018-01-22 MED ORDER — BUPIVACAINE-EPINEPHRINE (PF) 0.25% -1:200000 IJ SOLN
INTRAMUSCULAR | Status: AC
  Filled 2018-01-22: qty 30

## 2018-01-22 MED ORDER — CHLORHEXIDINE GLUCONATE CLOTH 2 % EX PADS: 6.0000 | MEDICATED_PAD | Freq: Once | CUTANEOUS | Status: DC

## 2018-01-22 MED ORDER — HYDROCODONE-ACETAMINOPHEN 5-325 MG PO TABS: 1.0000 | ORAL_TABLET | Freq: Four times a day (QID) | ORAL | 0 refills | Status: DC | PRN

## 2018-01-22 MED ORDER — EPHEDRINE SULFATE-NACL 50-0.9 MG/10ML-% IV SOSY
PREFILLED_SYRINGE | INTRAVENOUS | Status: DC | PRN
  Administered 2018-01-22 (×3): 5 mg via INTRAVENOUS

## 2018-01-22 MED ORDER — DEXAMETHASONE SODIUM PHOSPHATE 10 MG/ML IJ SOLN
INTRAMUSCULAR | Status: AC
  Filled 2018-01-22: qty 1

## 2018-01-22 MED ORDER — ROCURONIUM BROMIDE 10 MG/ML (PF) SYRINGE
PREFILLED_SYRINGE | INTRAVENOUS | Status: DC | PRN
  Administered 2018-01-22: 20 mg via INTRAVENOUS
  Administered 2018-01-22: 30 mg via INTRAVENOUS

## 2018-01-22 MED ORDER — PHENYLEPHRINE 40 MCG/ML (10ML) SYRINGE FOR IV PUSH (FOR BLOOD PRESSURE SUPPORT)
PREFILLED_SYRINGE | INTRAVENOUS | Status: AC
  Filled 2018-01-22: qty 30

## 2018-01-22 MED ORDER — LACTATED RINGERS IV SOLN
INTRAVENOUS | Status: DC
  Administered 2018-01-22: 08:00:00 via INTRAVENOUS
  Administered 2018-01-22: 1000 mL via INTRAVENOUS

## 2018-01-22 MED ORDER — PROPOFOL 10 MG/ML IV BOLUS
INTRAVENOUS | Status: DC | PRN
  Administered 2018-01-22: 180 mg via INTRAVENOUS

## 2018-01-22 MED ORDER — DEXAMETHASONE SODIUM PHOSPHATE 10 MG/ML IJ SOLN
INTRAMUSCULAR | Status: DC | PRN
  Administered 2018-01-22: 10 mg via INTRAVENOUS

## 2018-01-22 MED ORDER — PHENYLEPHRINE 40 MCG/ML (10ML) SYRINGE FOR IV PUSH (FOR BLOOD PRESSURE SUPPORT)
PREFILLED_SYRINGE | INTRAVENOUS | Status: DC | PRN
  Administered 2018-01-22: 120 ug via INTRAVENOUS
  Administered 2018-01-22 (×7): 80 ug via INTRAVENOUS
  Administered 2018-01-22: 120 ug via INTRAVENOUS
  Administered 2018-01-22: 160 ug via INTRAVENOUS
  Administered 2018-01-22: 80 ug via INTRAVENOUS

## 2018-01-22 MED ORDER — LIDOCAINE 2% (20 MG/ML) 5 ML SYRINGE
INTRAMUSCULAR | Status: AC
  Filled 2018-01-22: qty 5

## 2018-01-22 MED ORDER — ONDANSETRON HCL 4 MG/2ML IJ SOLN
INTRAMUSCULAR | Status: DC | PRN
  Administered 2018-01-22: 4 mg via INTRAVENOUS

## 2018-01-22 MED ORDER — ROCURONIUM BROMIDE 10 MG/ML (PF) SYRINGE
PREFILLED_SYRINGE | INTRAVENOUS | Status: AC
  Filled 2018-01-22: qty 10

## 2018-01-22 MED ORDER — CELECOXIB 200 MG PO CAPS
200.0000 mg | ORAL_CAPSULE | ORAL | Status: AC
  Administered 2018-01-22: 200 mg via ORAL
  Filled 2018-01-22: qty 1

## 2018-01-22 MED ORDER — ACETAMINOPHEN 500 MG PO TABS
1000.0000 mg | ORAL_TABLET | ORAL | Status: AC
  Administered 2018-01-22: 1000 mg via ORAL
  Filled 2018-01-22: qty 2

## 2018-01-22 MED ORDER — KETAMINE HCL 10 MG/ML IJ SOLN
INTRAMUSCULAR | Status: AC
  Filled 2018-01-22: qty 1

## 2018-01-22 MED ORDER — GABAPENTIN 300 MG PO CAPS
300.0000 mg | ORAL_CAPSULE | ORAL | Status: AC
  Administered 2018-01-22: 300 mg via ORAL
  Filled 2018-01-22: qty 1

## 2018-01-22 MED ORDER — BUPIVACAINE-EPINEPHRINE 0.25% -1:200000 IJ SOLN
INTRAMUSCULAR | Status: DC | PRN
  Administered 2018-01-22: 10 mL

## 2018-01-22 MED ORDER — FENTANYL CITRATE (PF) 100 MCG/2ML IJ SOLN: 25.0000 ug | INTRAMUSCULAR | Status: DC | PRN

## 2018-01-22 MED ORDER — SUGAMMADEX SODIUM 200 MG/2ML IV SOLN
INTRAVENOUS | Status: AC
  Filled 2018-01-22: qty 4

## 2018-01-22 MED ORDER — SUGAMMADEX SODIUM 200 MG/2ML IV SOLN
INTRAVENOUS | Status: DC | PRN
  Administered 2018-01-22: 400 mg via INTRAVENOUS

## 2018-01-22 MED ORDER — HYDROCODONE-ACETAMINOPHEN 7.5-325 MG PO TABS: 1.0000 | ORAL_TABLET | Freq: Once | ORAL | Status: DC | PRN

## 2018-01-22 MED ORDER — LIDOCAINE 2% (20 MG/ML) 5 ML SYRINGE
INTRAMUSCULAR | Status: DC | PRN
  Administered 2018-01-22: 1.5 mg/kg/h via INTRAVENOUS

## 2018-01-22 MED ORDER — FENTANYL CITRATE (PF) 250 MCG/5ML IJ SOLN
INTRAMUSCULAR | Status: DC | PRN
  Administered 2018-01-22: 50 ug via INTRAVENOUS
  Administered 2018-01-22: 100 ug via INTRAVENOUS
  Administered 2018-01-22: 50 ug via INTRAVENOUS

## 2018-01-22 MED ORDER — MIDAZOLAM HCL 2 MG/2ML IJ SOLN
INTRAMUSCULAR | Status: AC
  Filled 2018-01-22: qty 2

## 2018-01-22 MED ORDER — MIDAZOLAM HCL 5 MG/5ML IJ SOLN
INTRAMUSCULAR | Status: DC | PRN
  Administered 2018-01-22: 2 mg via INTRAVENOUS

## 2018-01-22 MED ORDER — SODIUM CHLORIDE 0.9 % IR SOLN
Status: DC | PRN
  Administered 2018-01-22: 1000 mL

## 2018-01-22 MED ORDER — PROPOFOL 10 MG/ML IV BOLUS
INTRAVENOUS | Status: AC
  Filled 2018-01-22: qty 20

## 2018-01-22 MED ORDER — ONDANSETRON HCL 4 MG/2ML IJ SOLN
INTRAMUSCULAR | Status: AC
  Filled 2018-01-22: qty 2

## 2018-01-22 SURGICAL SUPPLY — 31 items
BENZOIN TINCTURE PRP APPL 2/3 (GAUZE/BANDAGES/DRESSINGS) ×2 IMPLANT
BLADE HEX COATED 2.75 (ELECTRODE) ×2 IMPLANT
BLADE SURG 15 STRL LF DISP TIS (BLADE) ×1 IMPLANT
BLADE SURG 15 STRL SS (BLADE) ×1
BLADE SURG SZ10 CARB STEEL (BLADE) ×2 IMPLANT
CHLORAPREP W/TINT 26ML (MISCELLANEOUS) ×4 IMPLANT
DERMABOND ADVANCED (GAUZE/BANDAGES/DRESSINGS) ×1
DERMABOND ADVANCED .7 DNX12 (GAUZE/BANDAGES/DRESSINGS) ×1 IMPLANT
DRAPE LAPAROSCOPIC ABDOMINAL (DRAPES) ×2 IMPLANT
DRAPE POUCH INSTRU U-SHP 10X18 (DRAPES) ×2 IMPLANT
ELECT PENCIL ROCKER SW 15FT (MISCELLANEOUS) ×2 IMPLANT
ELECT REM PT RETURN 15FT ADLT (MISCELLANEOUS) ×2 IMPLANT
GAUZE 4X4 16PLY RFD (DISPOSABLE) ×2 IMPLANT
GAUZE SPONGE 4X4 12PLY STRL (GAUZE/BANDAGES/DRESSINGS) ×2 IMPLANT
GLOVE BIOGEL PI IND STRL 7.0 (GLOVE) ×1 IMPLANT
GLOVE BIOGEL PI INDICATOR 7.0 (GLOVE) ×1
GLOVE EUDERMIC 7 POWDERFREE (GLOVE) ×2 IMPLANT
GOWN STRL REUS W/TWL LRG LVL3 (GOWN DISPOSABLE) ×2 IMPLANT
GOWN STRL REUS W/TWL XL LVL3 (GOWN DISPOSABLE) ×4 IMPLANT
HEMOSTAT ARISTA ABSORB 3G PWDR (MISCELLANEOUS) ×2 IMPLANT
MARKER SKIN DUAL TIP RULER LAB (MISCELLANEOUS) ×2 IMPLANT
NEEDLE HYPO 22GX1.5 SAFETY (NEEDLE) ×2 IMPLANT
PACK BASIC VI WITH GOWN DISP (CUSTOM PROCEDURE TRAY) ×2 IMPLANT
STAPLER VISISTAT 35W (STAPLE) ×2 IMPLANT
STRIP CLOSURE SKIN 1/2X4 (GAUZE/BANDAGES/DRESSINGS) ×2 IMPLANT
SUT MNCRL AB 4-0 PS2 18 (SUTURE) ×2 IMPLANT
SUT VIC AB 2-0 SH 18 (SUTURE) ×2 IMPLANT
SUT VIC AB 3-0 SH 18 (SUTURE) ×2 IMPLANT
SYR CONTROL 10ML LL (SYRINGE) ×2 IMPLANT
TAPE CLOTH SURG 4X10 WHT LF (GAUZE/BANDAGES/DRESSINGS) ×2 IMPLANT
TOWEL OR 17X26 10 PK STRL BLUE (TOWEL DISPOSABLE) ×4 IMPLANT

## 2018-01-22 NOTE — Transfer of Care (Signed)
Immediate Anesthesia Transfer of Care Note  Patient: Kent Montgomery  Procedure(s) Performed: EXCISION OF 10 CM MASS ON RIGHT UPPER BACK ERAS PATHWAY (Right Back)  Patient Location: PACU  Anesthesia Type:General  Level of Consciousness: awake, alert , oriented and patient cooperative  Airway & Oxygen Therapy: Patient Spontanous Breathing and Patient connected to face mask oxygen  Post-op Assessment: Report given to RN, Post -op Vital signs reviewed and stable and Patient moving all extremities  Post vital signs: Reviewed and stable  Last Vitals:  Vitals Value Taken Time  BP    Temp    Pulse 90 01/22/2018  8:30 AM  Resp 13 01/22/2018  8:30 AM  SpO2 100 % 01/22/2018  8:30 AM  Vitals shown include unvalidated device data.  Last Pain:  Vitals:   01/22/18 0634  TempSrc: Oral  PainSc:       Patients Stated Pain Goal: 4 (01/65/53 7482)  Complications: No apparent anesthesia complications

## 2018-01-22 NOTE — Op Note (Addendum)
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 Dalbert Montgomery, M.D., FACS  Assistant:                      OR staff  Operative Indications:   The patient is a 62 year old male who presents with a complaint of 8-10 cm lipoma right upper back, intramuscular.Dr. Rosana Hoes  is his PCP.       MRI shows an 8 cm soft tissue mass, partially deep to the trapezius muscle which looks like a lipoma. Feels larger.   There is a small reactive lymph node in the area. Spinal accessory nerve is a little bit compressed by this. He says this bothers him a lot and would like to have this excised. I think that is reasonable and the risks are fairly low. I told him that this was most likely a benign lipoma. He will be scheduled for excision of 10 cm soft tissue mass right upper back in the near future. He agrees with this plan.        Operative Findings:       The soft tissue mass was 8 to 10 cm in diameter.  There was a soft and encapsulated with the gross characteristics of a benign lipoma.  It was deep to the trapezius muscle which was very thick.  There was minimal bleeding.  There was no apparent injury to any motor nerves.  Procedure in Detail:          Following the induction of general endotracheal anesthesia the patient was placed in the left lateral decubitus position with appropriate padding.  The right shoulder and upper chest were prepped and draped in a sterile fashion.  Intravenous antibiotics were given.  Surgical timeout was performed.  0.5% Marcaine with epinephrine was used as a local infiltration anesthetic.      A transverse linear incision was made overlying the palpable mass which was between the spine and the medial border of the  scapula.  Dissection was carried down through subcutaneous tissue.  Trapezius muscle fibers were separated and divided until I encountered the lipoma.  The incision was enlarged until I could expose the lipoma and dissected away from the surrounding tissues without much traction at all.  This came out as a single mass without any apparent nerve injury.  The specimen was sent to pathology.  The wound was inspected.  I found no other soft tissue mass.  The wound was irrigated.  Muscle bleeders were controlled with cautery.  I placed Arista hemostatic powder in the depths of the lipoma cavity.  The trapezius was closed with interrupted sutures of 2-0 Vicryl.  Subcutaneous tissue was closed with 3-0 Vicryl and the skin closed with a running subcuticular 4-0 Monocryl and Dermabond.  Clean bandages and ice pack were placed.  The patient tolerated the procedure well was taken to PACU in stable condition.  EBL less than 20 cc.  Counts  correct.  Complications none.    Addendum: I logged onto the [employer] and reviewed his prescription medication history     Kent Montgomery, M.D., FACS General and Minimally Invasive Surgery Breast and Colorectal Surgery  01/22/2018 8:20 AM

## 2018-01-22 NOTE — Discharge Instructions (Signed)
Keep wound clean and dry for 48 hours You may take a quick shower thereafter No tub baths for 3 weeks  Ice pack for 10 minutes at a time, intermittently while awake, for 24 hours

## 2018-01-22 NOTE — Interval H&P Note (Signed)
History and Physical Interval Note:  01/22/2018 6:35 AM  Kent Montgomery  has presented today for surgery, with the diagnosis of Soft tissue mass on back  The various methods of treatment have been discussed with the patient and family. After consideration of risks, benefits and other options for treatment, the patient has consented to  Procedure(s): EXCISION OF 10 CM MASS ON RIGHT UPPER BACK ERAS PATHWAY (Right) as a surgical intervention .  The patient's history has been reviewed, patient examined, no change in status, stable for surgery.  I have reviewed the patient's chart and labs.  Questions were answered to the patient's satisfaction.     Adin Hector

## 2018-01-22 NOTE — Anesthesia Procedure Notes (Signed)
Procedure Name: Intubation Date/Time: 01/22/2018 7:22 AM Performed by: Mitzie Na, CRNA Pre-anesthesia Checklist: Patient identified, Emergency Drugs available, Suction available, Patient being monitored and Timeout performed Patient Re-evaluated:Patient Re-evaluated prior to induction Oxygen Delivery Method: Circle system utilized Preoxygenation: Pre-oxygenation with 100% oxygen Induction Type: IV induction Ventilation: Mask ventilation without difficulty Laryngoscope Size: Mac and 4 Grade View: Grade I Tube type: Oral Tube size: 7.5 mm Number of attempts: 1 Airway Equipment and Method: Stylet Placement Confirmation: ETT inserted through vocal cords under direct vision,  positive ETCO2 and breath sounds checked- equal and bilateral Secured at: 24 cm Tube secured with: Tape Dental Injury: Teeth and Oropharynx as per pre-operative assessment

## 2018-01-23 ENCOUNTER — Encounter (HOSPITAL_COMMUNITY): Payer: Self-pay | Admitting: General Surgery

## 2018-01-23 NOTE — Anesthesia Postprocedure Evaluation (Signed)
Anesthesia Post Note  Patient: Kent Montgomery  Procedure(s) Performed: EXCISION OF 10 CM MASS ON RIGHT UPPER BACK ERAS PATHWAY (Right Back)     Patient location during evaluation: PACU Anesthesia Type: General Level of consciousness: awake and alert Pain management: pain level controlled Vital Signs Assessment: post-procedure vital signs reviewed and stable Respiratory status: spontaneous breathing, nonlabored ventilation, respiratory function stable and patient connected to nasal cannula oxygen Cardiovascular status: blood pressure returned to baseline and stable Postop Assessment: no apparent nausea or vomiting Anesthetic complications: no    Last Vitals:  Vitals:   01/22/18 0903 01/22/18 0959  BP: (!) 145/95 130/88  Pulse: 85 75  Resp: 15 15  Temp:  (!) 36.4 C  SpO2:  99%    Last Pain:  Vitals:   01/22/18 0959  TempSrc:   PainSc: 4                  Tiajuana Amass

## 2018-01-24 IMAGING — DX DG ABDOMEN 1V
1 series · 1 of 1 positions shown · non-contrast
Comparison: None.

CLINICAL DATA: Evaluate for

EXAM:
ABDOMEN - 1 VIEW

[abdomen kub]
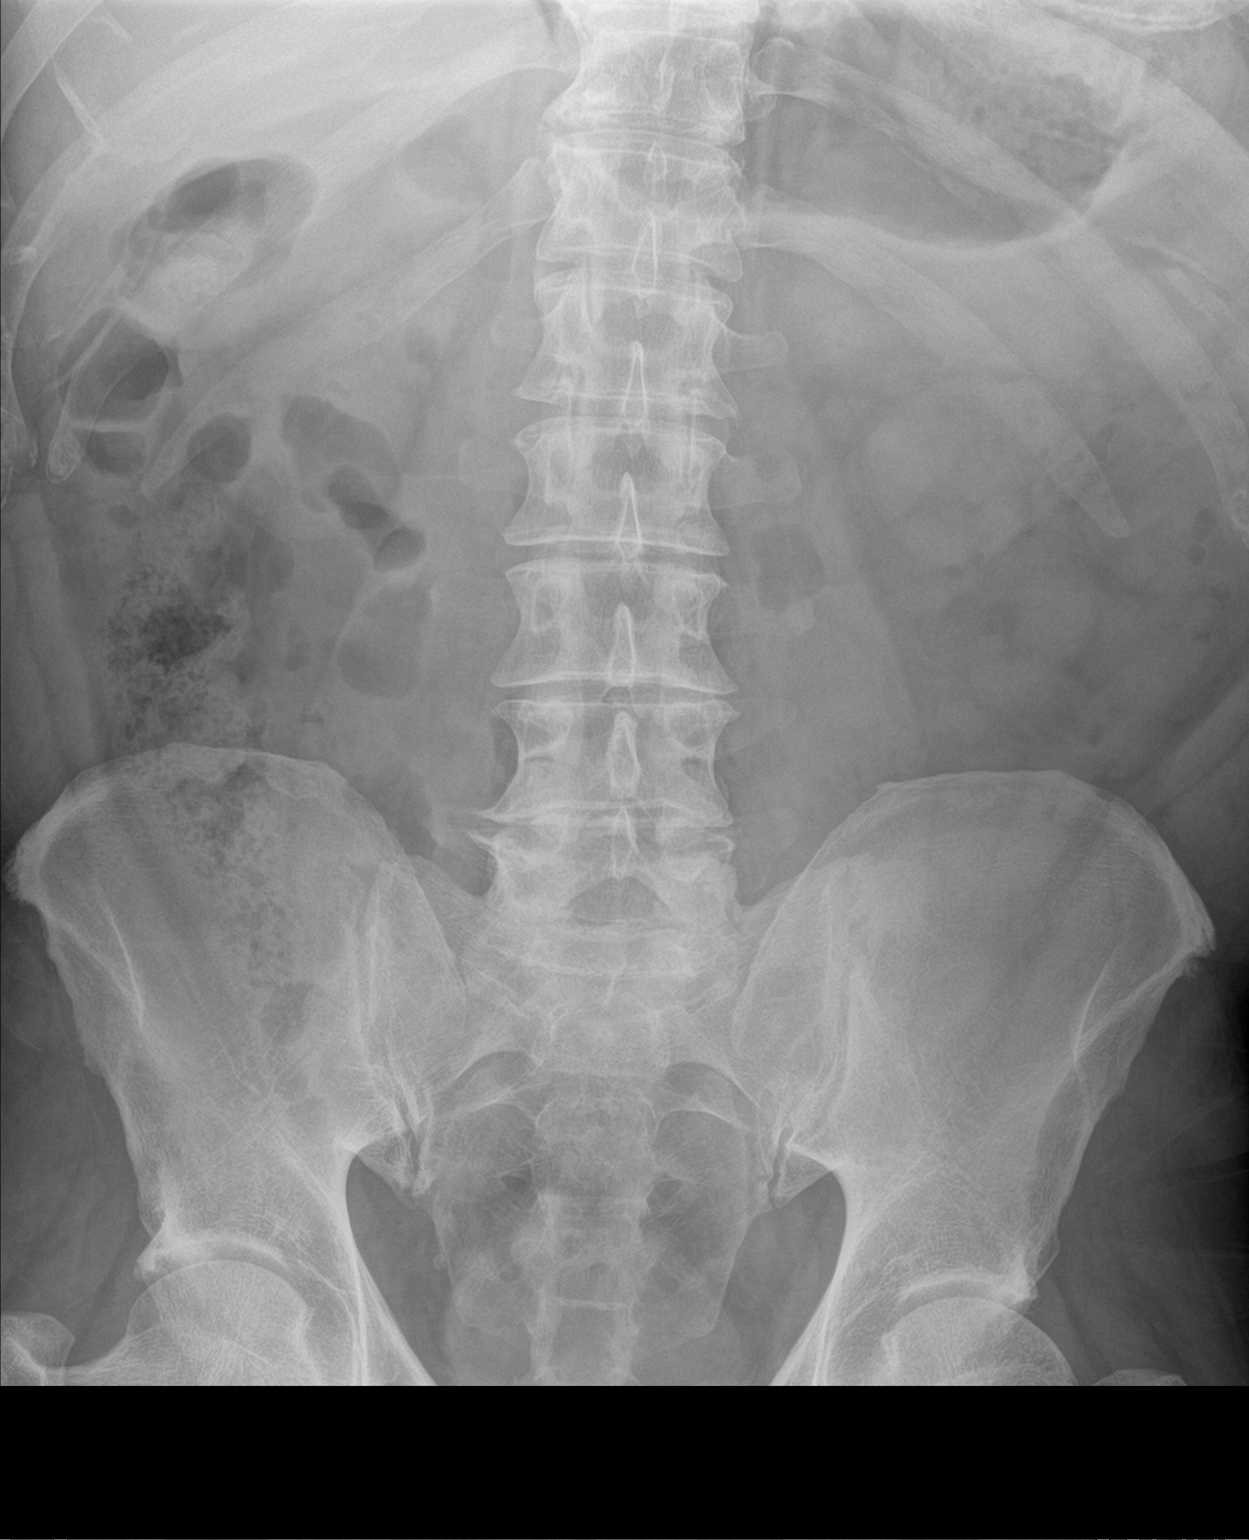

[1 of 1 positions shown; findings below may reference images not displayed]

FINDINGS: Scattered large and small bowel gas is noted. Givens capsule is not
identified on this exam. No acute bony abnormality is noted.
IMPRESSION: No capsule identified.

## 2018-01-24 NOTE — Progress Notes (Signed)
Inform patient of Pathology report,. The mass that was removed from his trapezius muscle was a benign lipoma.  No evidence of cancer.  Good news.  I will discuss in detail with him at next office visit. Let me know that she reached him  hmi

## 2018-03-19 IMAGING — NM NM BOWEL IMG MECKELS
1 series · 6 of 6 positions shown · non-contrast
Comparison: None

CLINICAL DATA: Blood in stool question Meckel's diverticulum,
history of GI bleed January 2016

EXAM:
NUCLEAR MEDICINE MECKELS SCAN
TECHNIQUE: Sequential abdominal images were obtained following intravenous
injection of radiopharmaceutical.
RADIOPHARMACEUTICALS:  10.3 millicuries Gc-66m pertechnetate IV

[Series 1: meckels · 4.14mm/px · 6 of 60 frames shown]
[frame 6/60]
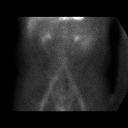
[frame 16/60]
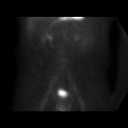
[frame 26/60]
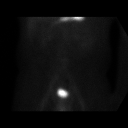
[frame 36/60]
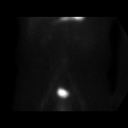
[frame 46/60]
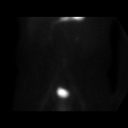
[frame 56/60]
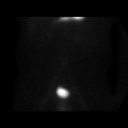

[6 of 6 positions shown; findings below may reference images not displayed]

FINDINGS: Normal blood pool distribution of tracer.

Excretion of tracer through kidneys and ureters to urinary bladder.

No abnormal abdominal localization of tracer is identified to
suggest presence of ectopic gastric mucosa and Meckel's
diverticulum.
IMPRESSION: Negative Choklate scan.

## 2019-02-24 DIAGNOSIS — Z85828 Personal history of other malignant neoplasm of skin: Secondary | ICD-10-CM | POA: Diagnosis not present

## 2019-02-24 DIAGNOSIS — L821 Other seborrheic keratosis: Secondary | ICD-10-CM | POA: Diagnosis not present

## 2019-02-24 DIAGNOSIS — B078 Other viral warts: Secondary | ICD-10-CM | POA: Diagnosis not present

## 2019-02-24 DIAGNOSIS — L738 Other specified follicular disorders: Secondary | ICD-10-CM | POA: Diagnosis not present

## 2019-02-24 DIAGNOSIS — D2221 Melanocytic nevi of right ear and external auricular canal: Secondary | ICD-10-CM | POA: Diagnosis not present

## 2019-02-24 DIAGNOSIS — D1801 Hemangioma of skin and subcutaneous tissue: Secondary | ICD-10-CM | POA: Diagnosis not present

## 2019-02-24 DIAGNOSIS — L723 Sebaceous cyst: Secondary | ICD-10-CM | POA: Diagnosis not present

## 2019-02-24 DIAGNOSIS — L820 Inflamed seborrheic keratosis: Secondary | ICD-10-CM | POA: Diagnosis not present

## 2019-09-22 ENCOUNTER — Encounter: Payer: Self-pay | Admitting: Internal Medicine

## 2019-10-25 ENCOUNTER — Other Ambulatory Visit: Payer: Self-pay

## 2019-10-25 ENCOUNTER — Ambulatory Visit (AMBULATORY_SURGERY_CENTER): Payer: Self-pay | Admitting: *Deleted

## 2019-10-25 VITALS — Temp 97.3°F | Ht 70.0 in | Wt 221.0 lb

## 2019-10-25 DIAGNOSIS — Z86010 Personal history of colonic polyps: Secondary | ICD-10-CM

## 2019-10-25 MED ORDER — NA SULFATE-K SULFATE-MG SULF 17.5-3.13-1.6 GM/177ML PO SOLN: ORAL | 0 refills | Status: DC

## 2019-10-25 NOTE — Progress Notes (Signed)
Patient is here in-person for PV. Patient denies any allergies to eggs or soy. Patient denies any problems with anesthesia/sedation. Patient denies any oxygen use at home. Patient denies taking any diet/weight loss medications or blood thinners. Patient is not being treated for MRSA or C-diff. Patient is aware of our care-partner policy and 0000000 safety protocol. EMMI education assisgned to the patient for the procedure, this was explained and instructions given to patient.    Completed both covid vaccines on 07/21/19 per pt.

## 2019-11-03 ENCOUNTER — Encounter: Payer: Self-pay | Admitting: Internal Medicine

## 2019-11-08 ENCOUNTER — Other Ambulatory Visit: Payer: Self-pay

## 2019-11-08 ENCOUNTER — Encounter: Payer: Self-pay | Admitting: Internal Medicine

## 2019-11-08 ENCOUNTER — Ambulatory Visit (AMBULATORY_SURGERY_CENTER): Payer: 59 | Admitting: Internal Medicine

## 2019-11-08 VITALS — BP 116/84 | HR 77 | Temp 97.3°F | Resp 17 | Ht 70.0 in | Wt 221.0 lb

## 2019-11-08 DIAGNOSIS — Z86010 Personal history of colonic polyps: Secondary | ICD-10-CM

## 2019-11-08 DIAGNOSIS — D123 Benign neoplasm of transverse colon: Secondary | ICD-10-CM | POA: Diagnosis not present

## 2019-11-08 DIAGNOSIS — D125 Benign neoplasm of sigmoid colon: Secondary | ICD-10-CM | POA: Diagnosis not present

## 2019-11-08 DIAGNOSIS — D122 Benign neoplasm of ascending colon: Secondary | ICD-10-CM

## 2019-11-08 DIAGNOSIS — Z1211 Encounter for screening for malignant neoplasm of colon: Secondary | ICD-10-CM | POA: Diagnosis not present

## 2019-11-08 MED ORDER — SODIUM CHLORIDE 0.9 % IV SOLN: 500.0000 mL | Freq: Once | INTRAVENOUS | Status: DC

## 2019-11-08 NOTE — Op Note (Signed)
[facility] Patient Name: Kent Montgomery Procedure Date: 11/08/2019 9:55 AM MRN: [id number] Endoscopist: Jerene Bears , MD Age: 64 Referring MD:  Date of Birth: 1955/11/05 Gender: Male Account #: [id number] Procedure:                Colonoscopy Indications:              High risk colon cancer surveillance: Personal                            history of colonic polyps (tubular adenoma and                            sessile serrated polyps), Last                            screening/surveillance colonoscopy: April 2015;                            incidental history of obscure GI bleeding with                            colonoscopy August 2017 Medicines:                Monitored Anesthesia Care Procedure:                Pre-Anesthesia Assessment:                           - Prior to the procedure, a History and Physical                            was performed, and patient medications and                            allergies were reviewed. The patient's tolerance of                            previous anesthesia was also reviewed. The risks                            and benefits of the procedure and the sedation                            options and risks were discussed with the patient.                            All questions were answered, and informed consent                            was obtained. Prior Anticoagulants: The patient has                            taken no previous anticoagulant or antiplatelet  agents. ASA Grade Assessment: II - A patient with                            mild systemic disease. After reviewing the risks                            and benefits, the patient was deemed in                            satisfactory condition to undergo the procedure.                           After obtaining informed consent, the colonoscope                            was passed under direct vision. Throughout the                             procedure, the patient's blood pressure, pulse, and                            oxygen saturations were monitored continuously. The                            Colonoscope was introduced through the anus and                            advanced to the terminal ileum. The colonoscopy was                            performed without difficulty. The patient tolerated                            the procedure well. The quality of the bowel                            preparation was good. The terminal ileum, ileocecal                            valve, appendiceal orifice, and rectum were                            photographed. The bowel preparation used was                            Miralax and SUPREP via split dose instruction. Scope In: [id number] AM Scope Out: 10:29:10 AM Scope Withdrawal Time: 0 hours 20 minutes 58 seconds  Total Procedure Duration: 0 hours 23 minutes 3 seconds  Findings:                 The digital rectal exam was normal.                           The terminal ileum appeared normal.  Four sessile polyps were found in the ascending                            colon. The polyps were 4 to 10 mm in size. These                            polyps were removed with a cold snare. Resection                            and retrieval were complete.                           Four sessile polyps were found in the transverse                            colon. The polyps were 5 to 8 mm in size. These                            polyps were removed with a cold snare. Resection                            and retrieval were complete.                           A 6 mm polyp was found in the sigmoid colon. The                            polyp was sessile. The polyp was removed with a                            cold snare. Resection and retrieval were complete.                           Multiple diverticula were found in the sigmoid                            colon and  descending colon.                           Internal hemorrhoids were found during                            retroflexion. The hemorrhoids were small. Complications:            No immediate complications. Estimated Blood Loss:     Estimated blood loss was minimal. Impression:               - The examined portion of the ileum was normal.                           - Four 4 to 10 mm polyps in the ascending colon,  removed with a cold snare. Resected and retrieved.                           - Four 5 to 8 mm polyps in the transverse colon,                            removed with a cold snare. Resected and retrieved.                           - One 6 mm polyp in the sigmoid colon, removed with                            a cold snare. Resected and retrieved.                           - Diverticulosis in the sigmoid colon and in the                            descending colon.                           - Internal hemorrhoids. Recommendation:           - Patient has a contact number available for                            emergencies. The signs and symptoms of potential                            delayed complications were discussed with the                            patient. Return to normal activities tomorrow.                            Written discharge instructions were provided to the                            patient.                           - Resume previous diet.                           - Continue present medications.                           - Await pathology results.                           - Repeat colonoscopy is recommended for                            surveillance. The colonoscopy date will be  determined after pathology results from today's                            exam become available for review. Jerene Bears, MD 11/08/2019 10:36:00 AM This report has been signed electronically.

## 2019-11-08 NOTE — Progress Notes (Signed)
Called to room to assist during endoscopic procedure.  Patient ID and intended procedure confirmed with present staff. Received instructions for my participation in the procedure from the performing physician.  

## 2019-11-08 NOTE — Patient Instructions (Signed)
 Handouts on polyps,diverticulosis ,& hemorrhoids given to you today  Await pathology results on polyps removed    YOU HAD AN ENDOSCOPIC PROCEDURE TODAY AT THE [facility]:   Refer to the procedure report that was given to you for any specific questions about what was found during the examination.  If the procedure report does not answer your questions, please call your gastroenterologist to clarify.  If you requested that your care partner not be given the details of your procedure findings, then the procedure report has been included in a sealed envelope for you to review at your convenience later.  YOU SHOULD EXPECT: Some feelings of bloating in the abdomen. Passage of more gas than usual.  Walking can help get rid of the air that was put into your GI tract during the procedure and reduce the bloating. If you had a lower endoscopy (such as a colonoscopy or flexible sigmoidoscopy) you may notice spotting of blood in your stool or on the toilet paper. If you underwent a bowel prep for your procedure, you may not have a normal bowel movement for a few days.  Please Note:  You might notice some irritation and congestion in your nose or some drainage.  This is from the oxygen used during your procedure.  There is no need for concern and it should clear up in a day or so.  SYMPTOMS TO REPORT IMMEDIATELY:   Following lower endoscopy (colonoscopy or flexible sigmoidoscopy):  Excessive amounts of blood in the stool  Significant tenderness or worsening of abdominal pains  Swelling of the abdomen that is new, acute  Fever of 100F or higher    For urgent or emergent issues, a gastroenterologist can be reached at any hour by calling [phone]. Do not use MyChart messaging for urgent concerns.    DIET:  We do recommend a small meal at first, but then you may proceed to your regular diet.  Drink plenty of fluids but you should avoid alcoholic beverages for 24 hours.  ACTIVITY:   You should plan to take it easy for the rest of today and you should NOT DRIVE or use heavy machinery until tomorrow (because of the sedation medicines used during the test).    FOLLOW UP: Our staff will call the number listed on your records 48-72 hours following your procedure to check on you and address any questions or concerns that you may have regarding the information given to you following your procedure. If we do not reach you, we will leave a message.  We will attempt to reach you two times.  During this call, we will ask if you have developed any symptoms of COVID 19. If you develop any symptoms (ie: fever, flu-like symptoms, shortness of breath, cough etc.) before then, please call [phone].  If you test positive for Covid 19 in the 2 weeks post procedure, please call and report this information to us.    If any biopsies were taken you will be contacted by phone or by letter within the next 1-3 weeks.  Please call us at [phone] if you have not heard about the biopsies in 3 weeks.    SIGNATURES/CONFIDENTIALITY: You and/or your care partner have signed paperwork which will be entered into your electronic medical record.  These signatures attest to the fact that that the information above on your After Visit Summary has been reviewed and is understood.  Full responsibility of the confidentiality of this discharge information lies with you   and/or your care-partner. 

## 2019-11-08 NOTE — Progress Notes (Signed)
Pt's states no medical or surgical changes since previsit or office visit.  VS by KA. Temp by LC. 

## 2019-11-08 NOTE — Progress Notes (Signed)
pt tolerated well. VSS. awake and to recovery. Report given to RN.  

## 2019-11-10 ENCOUNTER — Encounter: Payer: Self-pay | Admitting: Internal Medicine

## 2019-11-10 ENCOUNTER — Telehealth: Payer: Self-pay | Admitting: *Deleted

## 2019-11-10 ENCOUNTER — Telehealth: Payer: Self-pay

## 2019-11-10 NOTE — Telephone Encounter (Signed)
No answer, left message to call back later today, B.Bram Hottel RN. 

## 2019-11-10 NOTE — Telephone Encounter (Signed)
  Follow up Call-  Call back number 11/08/2019  Post procedure Call Back phone  # [phone]  Permission to leave phone message Yes  Some recent data might be hidden     Patient questions:  Do you have a fever, pain , or abdominal swelling? No. Pain Score  0 *  Have you tolerated food without any problems? Yes.    Have you been able to return to your normal activities? Yes.    Do you have any questions about your discharge instructions: Diet   No. Medications  No. Follow up visit  No.  Do you have questions or concerns about your Care? No.  Actions: * If pain score is 4 or above: No action needed, pain <4.  1. Have you developed a fever since your procedure? no  2.   Have you had an respiratory symptoms (SOB or cough) since your procedure? no  3.   Have you tested positive for COVID 19 since your procedure no  4.   Have you had any family members/close contacts diagnosed with the COVID 19 since your procedure?  no   If yes to any of these questions please route to Joylene John, RN and Erenest Rasher, RN

## 2020-03-06 DIAGNOSIS — D1801 Hemangioma of skin and subcutaneous tissue: Secondary | ICD-10-CM | POA: Diagnosis not present

## 2020-03-06 DIAGNOSIS — L723 Sebaceous cyst: Secondary | ICD-10-CM | POA: Diagnosis not present

## 2020-03-06 DIAGNOSIS — Z85828 Personal history of other malignant neoplasm of skin: Secondary | ICD-10-CM | POA: Diagnosis not present

## 2020-03-06 DIAGNOSIS — D2272 Melanocytic nevi of left lower limb, including hip: Secondary | ICD-10-CM | POA: Diagnosis not present

## 2020-03-06 DIAGNOSIS — L738 Other specified follicular disorders: Secondary | ICD-10-CM | POA: Diagnosis not present

## 2020-03-06 DIAGNOSIS — B078 Other viral warts: Secondary | ICD-10-CM | POA: Diagnosis not present

## 2020-03-06 DIAGNOSIS — L821 Other seborrheic keratosis: Secondary | ICD-10-CM | POA: Diagnosis not present

## 2020-05-11 DIAGNOSIS — L821 Other seborrheic keratosis: Secondary | ICD-10-CM | POA: Diagnosis not present

## 2020-05-11 DIAGNOSIS — Z85828 Personal history of other malignant neoplasm of skin: Secondary | ICD-10-CM | POA: Diagnosis not present

## 2020-05-11 DIAGNOSIS — L720 Epidermal cyst: Secondary | ICD-10-CM | POA: Diagnosis not present

## 2020-07-28 DIAGNOSIS — H5203 Hypermetropia, bilateral: Secondary | ICD-10-CM | POA: Diagnosis not present

## 2020-09-20 ENCOUNTER — Other Ambulatory Visit (HOSPITAL_COMMUNITY): Payer: Self-pay | Admitting: *Deleted

## 2020-09-27 ENCOUNTER — Other Ambulatory Visit: Payer: Self-pay

## 2020-09-27 ENCOUNTER — Ambulatory Visit (HOSPITAL_COMMUNITY)
Admission: RE | Admit: 2020-09-27 | Discharge: 2020-09-27 | Disposition: A | Discharge status: Home / Self Care | Payer: 59 | Source: Ambulatory Visit | Attending: Cardiology | Admitting: Cardiology

## 2020-11-24 ENCOUNTER — Other Ambulatory Visit: Payer: Self-pay

## 2020-11-24 ENCOUNTER — Other Ambulatory Visit (HOSPITAL_BASED_OUTPATIENT_CLINIC_OR_DEPARTMENT_OTHER): Payer: Self-pay

## 2020-11-24 ENCOUNTER — Ambulatory Visit: Payer: 59 | Attending: Internal Medicine

## 2020-11-24 DIAGNOSIS — Z23 Encounter for immunization: Secondary | ICD-10-CM

## 2020-11-24 MED ORDER — PFIZER-BIONT COVID-19 VAC-TRIS 30 MCG/0.3ML IM SUSP
INTRAMUSCULAR | 0 refills | Status: DC
  Filled 2020-11-24: qty 0.3, 1d supply, fill #0

## 2020-11-24 NOTE — Progress Notes (Signed)
   Covid-19 Vaccination Clinic  Name:  Kent Montgomery    MRN: [id number] DOB: [birth year]  11/24/2020  Mr. Kent Montgomery was observed post Covid-19 immunization for 15 minutes without incident. He was provided with Vaccine Information Sheet and instruction to access the V-Safe system.   Mr. Kent Montgomery was instructed to call 911 with any severe reactions post vaccine: Marland Kitchen Difficulty breathing  . Swelling of face and throat  . A fast heartbeat  . A bad rash all over body  . Dizziness and weakness   Immunizations Administered    Name Date Dose VIS Date Route   PFIZER Comrnaty(Gray TOP) Covid-19 Vaccine 11/24/2020  2:13 PM 0.3 mL 06/01/2020 Intramuscular   Manufacturer: [place]   Lot: [id number]   [place]: [phone]

## 2020-12-01 DIAGNOSIS — Z125 Encounter for screening for malignant neoplasm of prostate: Secondary | ICD-10-CM | POA: Diagnosis not present

## 2020-12-01 DIAGNOSIS — Z0000 Encounter for general adult medical examination without abnormal findings: Secondary | ICD-10-CM | POA: Diagnosis not present

## 2020-12-01 DIAGNOSIS — E7800 Pure hypercholesterolemia, unspecified: Secondary | ICD-10-CM | POA: Diagnosis not present

## 2020-12-08 DIAGNOSIS — E7800 Pure hypercholesterolemia, unspecified: Secondary | ICD-10-CM | POA: Diagnosis not present

## 2020-12-08 DIAGNOSIS — D171 Benign lipomatous neoplasm of skin and subcutaneous tissue of trunk: Secondary | ICD-10-CM | POA: Diagnosis not present

## 2020-12-08 DIAGNOSIS — M549 Dorsalgia, unspecified: Secondary | ICD-10-CM | POA: Diagnosis not present

## 2020-12-08 DIAGNOSIS — Z1339 Encounter for screening examination for other mental health and behavioral disorders: Secondary | ICD-10-CM | POA: Diagnosis not present

## 2020-12-08 DIAGNOSIS — D126 Benign neoplasm of colon, unspecified: Secondary | ICD-10-CM | POA: Diagnosis not present

## 2020-12-08 DIAGNOSIS — Z1212 Encounter for screening for malignant neoplasm of rectum: Secondary | ICD-10-CM | POA: Diagnosis not present

## 2020-12-08 DIAGNOSIS — R7401 Elevation of levels of liver transaminase levels: Secondary | ICD-10-CM | POA: Diagnosis not present

## 2020-12-08 DIAGNOSIS — J302 Other seasonal allergic rhinitis: Secondary | ICD-10-CM | POA: Diagnosis not present

## 2020-12-08 DIAGNOSIS — R82998 Other abnormal findings in urine: Secondary | ICD-10-CM | POA: Diagnosis not present

## 2020-12-08 DIAGNOSIS — Z0000 Encounter for general adult medical examination without abnormal findings: Secondary | ICD-10-CM | POA: Diagnosis not present

## 2020-12-08 DIAGNOSIS — Z1331 Encounter for screening for depression: Secondary | ICD-10-CM | POA: Diagnosis not present

## 2020-12-08 DIAGNOSIS — G4733 Obstructive sleep apnea (adult) (pediatric): Secondary | ICD-10-CM | POA: Diagnosis not present

## 2021-03-22 ENCOUNTER — Ambulatory Visit: Payer: 59 | Attending: Internal Medicine

## 2021-03-22 DIAGNOSIS — Z23 Encounter for immunization: Secondary | ICD-10-CM

## 2021-03-23 NOTE — Progress Notes (Signed)
   Covid-19 Vaccination Clinic  Name:  Kent Montgomery    MRN: [id number] DOB: 02-03-1956  03/23/2021  Kent Montgomery was observed post Covid-19 immunization for 15 minutes without incident. He was provided with Vaccine Information Sheet and instruction to access the V-Safe system.   Kent Montgomery was instructed to call 911 with any severe reactions post vaccine: Difficulty breathing  Swelling of face and throat  A fast heartbeat  A bad rash all over body  Dizziness and weakness

## 2021-03-26 DIAGNOSIS — D1801 Hemangioma of skin and subcutaneous tissue: Secondary | ICD-10-CM | POA: Diagnosis not present

## 2021-03-26 DIAGNOSIS — Z85828 Personal history of other malignant neoplasm of skin: Secondary | ICD-10-CM | POA: Diagnosis not present

## 2021-03-26 DIAGNOSIS — L738 Other specified follicular disorders: Secondary | ICD-10-CM | POA: Diagnosis not present

## 2021-03-26 DIAGNOSIS — L820 Inflamed seborrheic keratosis: Secondary | ICD-10-CM | POA: Diagnosis not present

## 2021-03-26 DIAGNOSIS — L821 Other seborrheic keratosis: Secondary | ICD-10-CM | POA: Diagnosis not present

## 2021-03-26 DIAGNOSIS — D225 Melanocytic nevi of trunk: Secondary | ICD-10-CM | POA: Diagnosis not present

## 2021-03-30 ENCOUNTER — Ambulatory Visit: Payer: 59

## 2021-04-02 ENCOUNTER — Other Ambulatory Visit (HOSPITAL_BASED_OUTPATIENT_CLINIC_OR_DEPARTMENT_OTHER): Payer: Self-pay

## 2021-04-02 MED ORDER — COVID-19MRNA BIVAL VACC PFIZER 30 MCG/0.3ML IM SUSP
INTRAMUSCULAR | 0 refills | Status: DC
  Filled 2021-04-02: qty 0.3, 1d supply, fill #0

## 2021-08-24 DIAGNOSIS — R109 Unspecified abdominal pain: Secondary | ICD-10-CM | POA: Diagnosis not present

## 2021-08-24 DIAGNOSIS — R1011 Right upper quadrant pain: Secondary | ICD-10-CM | POA: Diagnosis not present

## 2021-10-24 ENCOUNTER — Other Ambulatory Visit: Payer: Self-pay | Admitting: Registered Nurse

## 2021-10-24 ENCOUNTER — Ambulatory Visit
Admission: RE | Admit: 2021-10-24 | Discharge: 2021-10-24 | Disposition: A | Discharge status: Home / Self Care | Payer: 59 | Source: Ambulatory Visit | Attending: Registered Nurse | Admitting: Registered Nurse

## 2021-10-24 DIAGNOSIS — R109 Unspecified abdominal pain: Secondary | ICD-10-CM

## 2021-10-24 DIAGNOSIS — I700 Atherosclerosis of aorta: Secondary | ICD-10-CM | POA: Diagnosis not present

## 2021-10-24 DIAGNOSIS — N200 Calculus of kidney: Secondary | ICD-10-CM | POA: Diagnosis not present

## 2021-11-08 DIAGNOSIS — R1011 Right upper quadrant pain: Secondary | ICD-10-CM | POA: Diagnosis not present

## 2022-01-08 DIAGNOSIS — Z0000 Encounter for general adult medical examination without abnormal findings: Secondary | ICD-10-CM | POA: Diagnosis not present

## 2022-01-14 ENCOUNTER — Telehealth: Payer: Self-pay

## 2022-01-14 NOTE — Telephone Encounter (Signed)
Yes will accept   Schedule for 11 am slot

## 2022-01-14 NOTE — Telephone Encounter (Signed)
Pt wife is a patient of Dr. Senaida Ores and wondering if Dr. Quay Burow would be willing to accept the pt as a new pt.   I advised the pt wife I would reach back out to the pt upon approval from Dr. Quay Burow.  Please advise

## 2022-02-12 ENCOUNTER — Encounter: Payer: Self-pay | Admitting: Internal Medicine

## 2022-02-12 DIAGNOSIS — R739 Hyperglycemia, unspecified: Secondary | ICD-10-CM | POA: Insufficient documentation

## 2022-02-12 DIAGNOSIS — E538 Deficiency of other specified B group vitamins: Secondary | ICD-10-CM | POA: Insufficient documentation

## 2022-02-12 DIAGNOSIS — Z85828 Personal history of other malignant neoplasm of skin: Secondary | ICD-10-CM | POA: Insufficient documentation

## 2022-02-12 NOTE — Patient Instructions (Addendum)
   Follow up in July 2024 - for a physical.

## 2022-02-12 NOTE — Progress Notes (Unsigned)
Subjective:    Patient ID: Kent Montgomery, male    DOB: December 29, 1955, 66 y.o.   MRN: [id number]     HPI He is here to establish with a new pcp.   [name] is here for a physical exam.      Medications and allergies reviewed with patient and updated if appropriate.  Current Outpatient Medications on File Prior to Visit  Medication Sig Dispense Refill   b complex vitamins tablet Take 1 tablet by mouth daily.     Coenzyme Q10 (COQ10 PO) Take 1 tablet by mouth daily.     fluticasone (FLONASE) 50 MCG/ACT nasal spray Place into both nostrils daily.     loratadine (CLARITIN) 10 MG tablet Take 10 mg by mouth daily.     MEGARED OMEGA-3 KRILL OIL PO Take 1 capsule by mouth daily.      Multiple Vitamin (MULTIVITAMIN WITH MINERALS) TABS tablet Take 1 tablet by mouth daily.     PSYLLIUM HUSK PO Take 5-6 capsules by mouth 2 (two) times daily.      S-Adenosylmethionine (SAM-E) 400 MG TABS Take 800 mg by mouth daily.     TURMERIC PO Take 2 tablets by mouth daily.      No current facility-administered medications on file prior to visit.    Review of Systems     Objective:  There were no vitals filed for this visit. There were no vitals filed for this visit. There is no height or weight on file to calculate BMI.  BP Readings from Last 3 Encounters:  11/08/19 116/84  01/22/18 130/88  01/09/18 122/77    Wt Readings from Last 3 Encounters:  11/08/19 221 lb (100.2 kg)  10/25/19 221 lb (100.2 kg)  01/22/18 213 lb (96.6 kg)      Physical Exam Constitutional: He appears well-developed and well-nourished. No distress.  HENT:  Head: Normocephalic and atraumatic.  Right Ear: External ear normal.  Left Ear: External ear normal.  Mouth/Throat: Oropharynx is clear and moist.  Normal ear canals and TM b/l  Eyes: Conjunctivae and EOM are normal.  Neck: Neck supple. No tracheal deviation present. No thyromegaly present.  No carotid bruit  Cardiovascular: Normal rate, regular rhythm,  normal heart sounds and intact distal pulses.   No murmur heard. Pulmonary/Chest: Effort normal and breath sounds normal. No respiratory distress. He has no wheezes. He has no rales.  Abdominal: Soft. He exhibits no distension. There is no tenderness.  Genitourinary: deferred  Musculoskeletal: He exhibits no edema.  Lymphadenopathy:   He has no cervical adenopathy.  Skin: Skin is warm and dry. He is not diaphoretic.  Psychiatric: He has a normal mood and affect. His behavior is normal.         Assessment & Plan:   Physical exam: Screening blood work  ordered Exercise    Weight   Substance abuse   none   Reviewed recommended immunizations.   Health Maintenance  Topic Date Due   TETANUS/TDAP  Never done   Zoster Vaccines- Shingrix (1 of 2) Never done   COVID-19 Vaccine (3 - Pfizer risk series) 04/19/2021   INFLUENZA VACCINE  01/22/2022   Pneumonia Vaccine 48+ Years old (1 - PCV) 01/28/2022   COLONOSCOPY (Pts 45-76yr Insurance coverage will need to be confirmed)  11/08/2022   Hepatitis C Screening  Completed   HIV Screening  Completed   HPV VACCINES  Aged Out     See Problem List for Assessment and Plan of chronic medical  problems.

## 2022-02-13 ENCOUNTER — Ambulatory Visit: Payer: 59 | Admitting: Internal Medicine

## 2022-02-13 VITALS — BP 120/78 | HR 80 | Temp 98.5°F | Ht 70.0 in | Wt 210.8 lb

## 2022-02-13 DIAGNOSIS — R931 Abnormal findings on diagnostic imaging of heart and coronary circulation: Secondary | ICD-10-CM

## 2022-02-13 DIAGNOSIS — E538 Deficiency of other specified B group vitamins: Secondary | ICD-10-CM

## 2022-02-13 DIAGNOSIS — Z0000 Encounter for general adult medical examination without abnormal findings: Secondary | ICD-10-CM

## 2022-02-13 DIAGNOSIS — Z85828 Personal history of other malignant neoplasm of skin: Secondary | ICD-10-CM

## 2022-02-13 DIAGNOSIS — R739 Hyperglycemia, unspecified: Secondary | ICD-10-CM | POA: Diagnosis not present

## 2022-02-13 DIAGNOSIS — N200 Calculus of kidney: Secondary | ICD-10-CM | POA: Insufficient documentation

## 2022-02-13 DIAGNOSIS — E7849 Other hyperlipidemia: Secondary | ICD-10-CM

## 2022-02-13 NOTE — Assessment & Plan Note (Signed)
Chronic Taking supplementation

## 2022-02-13 NOTE — Assessment & Plan Note (Addendum)
Has tried multiple statins -cholesterol improved, but liver tests became elevated and he had significant joint pain CT coronary artery scan with score of 29 Continue lifestyle control

## 2022-02-13 NOTE — Assessment & Plan Note (Signed)
Sugars have been in the normal range with recent blood work

## 2022-02-13 NOTE — Progress Notes (Signed)
Subjective:    Patient ID: Kent Montgomery, male    DOB: [birth year], 66 y.o.   MRN: [id number]     HPI Kent Montgomery is here for follow up of his chronic medical problems, including   Back - couple of surgeris - h/o mult verte fx - never wants pain meds H/o renalstones  Pain in RUQ, right lowr back - stone - right flank -- prob nerve related  He exercises on a regular basis-walks, runs, will slates  Walk, run, weights    Medications and allergies reviewed with patient and updated if appropriate.  Current Outpatient Medications on File Prior to Visit  Medication Sig Dispense Refill   b complex vitamins tablet Take 1 tablet by mouth daily.     Coenzyme Q10 (COQ10 PO) Take 1 tablet by mouth daily.     fluticasone (FLONASE) 50 MCG/ACT nasal spray Place into both nostrils daily.     loratadine (CLARITIN) 10 MG tablet Take 10 mg by mouth daily.     MEGARED OMEGA-3 KRILL OIL PO Take 1 capsule by mouth daily.      Multiple Vitamin (MULTIVITAMIN WITH MINERALS) TABS tablet Take 1 tablet by mouth daily.     PSYLLIUM HUSK PO Take 5-6 capsules by mouth 2 (two) times daily.      S-Adenosylmethionine (SAM-E) 400 MG TABS Take 800 mg by mouth daily.     TURMERIC PO Take 2 tablets by mouth daily.      No current facility-administered medications on file prior to visit.     Review of Systems  Constitutional:  Negative for fever.  Respiratory:  Negative for cough, shortness of breath and wheezing.   Cardiovascular:  Negative for chest pain, palpitations and leg swelling.  Neurological:  Positive for headaches (Occasional migraine-does not take medication for it). Negative for light-headedness.       Objective:   Vitals:   02/13/22 1130  BP: 120/78  Pulse: 80  Temp: 98.5 F (36.9 C)  SpO2: 97%   BP Readings from Last 3 Encounters:  02/13/22 120/78  11/08/19 116/84  01/22/18 130/88   Wt Readings from Last 3 Encounters:  02/13/22 210 lb 12.8 oz (95.6 kg)  11/08/19 221 lb  (100.2 kg)  10/25/19 221 lb (100.2 kg)   Body mass index is 30.25 kg/m.    Physical Exam Constitutional:      General: He is not in acute distress.    Appearance: Normal appearance. He is not ill-appearing.  HENT:     Head: Normocephalic and atraumatic.  Eyes:     Conjunctiva/sclera: Conjunctivae normal.  Neck:     Vascular: No carotid bruit.  Cardiovascular:     Rate and Rhythm: Normal rate and regular rhythm.     Heart sounds: Normal heart sounds. No murmur heard. Pulmonary:     Effort: Pulmonary effort is normal. No respiratory distress.     Breath sounds: Normal breath sounds. No wheezing or rales.  Musculoskeletal:     Cervical back: Neck supple. No tenderness.     Right lower leg: No edema.     Left lower leg: No edema.  Lymphadenopathy:     Cervical: No cervical adenopathy.  Skin:    General: Skin is warm and dry.     Findings: No rash.  Neurological:     Mental Status: He is alert. Mental status is at baseline.  Psychiatric:        Mood and Affect: Mood normal.  Lab Results  Component Value Date   WBC 5.3 01/09/2018   HGB 15.4 01/09/2018   HCT 45.6 01/09/2018   PLT 246 01/09/2018   GLUCOSE 104 (H) 01/09/2018   ALT 23 01/29/2016   AST 16 01/29/2016   NA 136 01/09/2018   K 4.8 01/09/2018   CL 103 01/09/2018   CREATININE 1.17 01/09/2018   BUN 20 01/09/2018   CO2 27 01/09/2018   INR 1.28 01/26/2016     Assessment & Plan:    See Problem List for Assessment and Plan of chronic medical problems.   We will follow-up annually for physical-next CPE due July 2024

## 2022-09-12 ENCOUNTER — Ambulatory Visit: Payer: Commercial Managed Care - PPO | Admitting: Sports Medicine

## 2022-09-12 ENCOUNTER — Other Ambulatory Visit (HOSPITAL_COMMUNITY): Payer: Self-pay

## 2022-09-12 ENCOUNTER — Ambulatory Visit (INDEPENDENT_AMBULATORY_CARE_PROVIDER_SITE_OTHER): Payer: Commercial Managed Care - PPO

## 2022-09-12 VITALS — BP 118/80 | HR 90 | Ht 70.0 in | Wt 229.0 lb

## 2022-09-12 DIAGNOSIS — S63093A Other subluxation of unspecified wrist and hand, initial encounter: Secondary | ICD-10-CM

## 2022-09-12 DIAGNOSIS — R200 Anesthesia of skin: Secondary | ICD-10-CM | POA: Diagnosis not present

## 2022-09-12 DIAGNOSIS — M25531 Pain in right wrist: Secondary | ICD-10-CM

## 2022-09-12 MED ORDER — MELOXICAM 15 MG PO TABS
15.0000 mg | ORAL_TABLET | Freq: Every day | ORAL | 0 refills | Status: DC
  Filled 2022-09-12: qty 30, 30d supply, fill #0

## 2022-09-12 NOTE — Patient Instructions (Addendum)
Good to see you  - Start meloxicam 15 mg daily x2 weeks.  If still having pain after 2 weeks, complete 3rd-week of meloxicam. May use remaining meloxicam as needed once daily for pain control.  Do not to use additional NSAIDs while taking meloxicam.  May use Tylenol [phone] mg 2 to 3 times a day for breakthrough pain. Wrist HEP

## 2022-09-12 NOTE — Progress Notes (Signed)
Kent Montgomery D.[place] Phone: [phone]   Assessment and Plan:     1. Right wrist pain 2. Subluxation of extensor carpi ulnaris tendon, initial encounter -Acute, initial sports medicine visit - Most consistent with subluxation of the ECU tendon over ulnar-sided wrist causing "popping" sensation and leading to ongoing pain and irritation.  Differential includes sprain of TFCC - Start meloxicam 15 mg daily x2 weeks.  If still having pain after 2 weeks, complete 3rd-week of meloxicam. May use remaining meloxicam as needed once daily for pain control.  Do not to use additional NSAIDs while taking meloxicam.  May use Tylenol [phone] mg 2 to 3 times a day for breakthrough pain. - Recommend relative rest including no lifting more than 5 to 10 pounds with right upper extremity and avoiding weight lifting activities over the next 2 weeks for patient's trip to [place] - Patient could use a wrist brace, though I do not think it is necessary as long as patient adheres to weight lifting restriction - X-ray obtained in clinic.  Minor rotation: No acute fracture or dislocation.  Other orders - meloxicam (MOBIC) 15 MG tablet; Take 1 tablet (15 mg total) by mouth daily.    Pertinent previous records reviewed include none   Follow Up: As needed if no improvement or worsening of symptoms.  Could consider ultrasound   Subjective:   I, Kent Montgomery, am serving as a Education administrator for Doctor Kent Montgomery  Chief Complaint: wrist pain   HPI:   09/12/22 Patient is a 67 year old male complaining of wrist pain. Patient states right wrist , was lifting  weights dumbbell press 75 lb yesterday,   felt something pop, numbness, decreased ROM, no meds,  decreased grip strength    Relevant Historical Information:  None pertinent  Additional pertinent review of systems negative.   Current Outpatient Medications:    b complex  vitamins tablet, Take 1 tablet by mouth daily., Disp: , Rfl:    Coenzyme Q10 (COQ10 PO), Take 1 tablet by mouth daily., Disp: , Rfl:    fluticasone (FLONASE) 50 MCG/ACT nasal spray, Place into both nostrils daily., Disp: , Rfl:    loratadine (CLARITIN) 10 MG tablet, Take 10 mg by mouth daily., Disp: , Rfl:    MEGARED OMEGA-3 KRILL OIL PO, Take 1 capsule by mouth daily. , Disp: , Rfl:    meloxicam (MOBIC) 15 MG tablet, Take 1 tablet (15 mg total) by mouth daily., Disp: 30 tablet, Rfl: 0   Multiple Vitamin (MULTIVITAMIN WITH MINERALS) TABS tablet, Take 1 tablet by mouth daily., Disp: , Rfl:    PSYLLIUM HUSK PO, Take 5-6 capsules by mouth 2 (two) times daily. , Disp: , Rfl:    S-Adenosylmethionine (SAM-E) 400 MG TABS, Take 800 mg by mouth daily., Disp: , Rfl:    TURMERIC PO, Take 2 tablets by mouth daily. , Disp: , Rfl:    Objective:     Vitals:   09/12/22 1440  BP: 118/80  Pulse: 90  SpO2: 97%  Weight: 229 lb (103.9 kg)  Height: 5\' 10"  (1.778 m)      Body mass index is 32.86 kg/m.    Physical Exam:    General: Appears well, nad, nontoxic and pleasant Neuro:sensation intact, strength is 5/5 with df/pf/inv/ev, muscle tone wnl Skin:no susupicious lesions or rashes  Right wrist:   No deformity or swelling appreciated. ROM  Ext 70, flexion60, radial/ulnar deviation 30/15 TTP  TFCC and ECU nttp over the snuff box, dorsal carpals, volar carpals, radial styloid, ulnar styloid, 1st mcp, tfcc Negative Tinel's Negative finklestein Positive tfcc bounce test Mild pain with resisted ext, flex or deviation    Electronically signed by:  Kent Montgomery D.[facility] 3:28 PM 09/12/22

## 2022-11-11 ENCOUNTER — Encounter: Payer: Self-pay | Admitting: Internal Medicine

## 2022-12-18 ENCOUNTER — Ambulatory Visit (AMBULATORY_SURGERY_CENTER): Payer: Commercial Managed Care - PPO

## 2022-12-18 ENCOUNTER — Encounter: Payer: Self-pay | Admitting: Internal Medicine

## 2022-12-18 ENCOUNTER — Other Ambulatory Visit (HOSPITAL_COMMUNITY): Payer: Self-pay

## 2022-12-18 VITALS — Ht 70.0 in | Wt 219.0 lb

## 2022-12-18 DIAGNOSIS — Z86010 Personal history of colonic polyps: Secondary | ICD-10-CM

## 2022-12-18 MED ORDER — NA SULFATE-K SULFATE-MG SULF 17.5-3.13-1.6 GM/177ML PO SOLN
1.0000 | Freq: Once | ORAL | 0 refills | Status: AC
  Filled 2022-12-18: qty 354, 1d supply, fill #0

## 2022-12-18 NOTE — Progress Notes (Signed)
No egg or soy allergy known to patient  No issues known to pt with past sedation with any surgeries or procedures Patient denies ever being told they had issues or difficulty with intubation  No FH of Malignant Hyperthermia Pt is not on diet pills Pt is not on  home 02  Pt is not on blood thinners  Pt denies issues with constipation - takes fiber daily  No A fib or A flutter Have any cardiac testing pending--no  LOA: independent  Prep: 2 day suprep  Patient's chart reviewed by Cathlyn Parsons CNRA prior to previsit and patient appropriate for the [facility].  Previsit completed and red dot placed by patient's name on their procedure day (on provider's schedule).     PV competed with patient. Prep instructions sent via mychart and home address.

## 2022-12-25 ENCOUNTER — Other Ambulatory Visit (HOSPITAL_COMMUNITY): Payer: Self-pay

## 2023-01-13 ENCOUNTER — Ambulatory Visit (AMBULATORY_SURGERY_CENTER): Payer: Commercial Managed Care - PPO | Admitting: Internal Medicine

## 2023-01-13 ENCOUNTER — Encounter: Payer: Self-pay | Admitting: Internal Medicine

## 2023-01-13 VITALS — BP 126/71 | HR 85 | Temp 97.8°F | Resp 13 | Ht 70.0 in | Wt 219.0 lb

## 2023-01-13 DIAGNOSIS — Z1211 Encounter for screening for malignant neoplasm of colon: Secondary | ICD-10-CM | POA: Diagnosis not present

## 2023-01-13 DIAGNOSIS — D123 Benign neoplasm of transverse colon: Secondary | ICD-10-CM | POA: Diagnosis not present

## 2023-01-13 DIAGNOSIS — E785 Hyperlipidemia, unspecified: Secondary | ICD-10-CM | POA: Diagnosis not present

## 2023-01-13 DIAGNOSIS — Z09 Encounter for follow-up examination after completed treatment for conditions other than malignant neoplasm: Secondary | ICD-10-CM | POA: Diagnosis not present

## 2023-01-13 DIAGNOSIS — D122 Benign neoplasm of ascending colon: Secondary | ICD-10-CM

## 2023-01-13 DIAGNOSIS — Z86010 Personal history of colonic polyps: Secondary | ICD-10-CM

## 2023-01-13 MED ORDER — SODIUM CHLORIDE 0.9 % IV SOLN: 500.0000 mL | Freq: Once | INTRAVENOUS | Status: DC

## 2023-01-13 NOTE — Patient Instructions (Signed)
YOU HAD AN ENDOSCOPIC PROCEDURE TODAY AT THE [facility]:   Refer to the procedure report that was given to you for any specific questions about what was found during the examination.  If the procedure report does not answer your questions, please call your gastroenterologist to clarify.  If you requested that your care partner not be given the details of your procedure findings, then the procedure report has been included in a sealed envelope for you to review at your convenience later.  **Handouts given on polyps, hemorrhoids and diverticulosis**  YOU SHOULD EXPECT: Some feelings of bloating in the abdomen. Passage of more gas than usual.  Walking can help get rid of the air that was put into your GI tract during the procedure and reduce the bloating. If you had a lower endoscopy (such as a colonoscopy or flexible sigmoidoscopy) you may notice spotting of blood in your stool or on the toilet paper. If you underwent a bowel prep for your procedure, you may not have a normal bowel movement for a few days.  Please Note:  You might notice some irritation and congestion in your nose or some drainage.  This is from the oxygen used during your procedure.  There is no need for concern and it should clear up in a day or so.  SYMPTOMS TO REPORT IMMEDIATELY:  Following lower endoscopy (colonoscopy or flexible sigmoidoscopy):  Excessive amounts of blood in the stool  Significant tenderness or worsening of abdominal pains  Swelling of the abdomen that is new, acute  Fever of 100F or higher  For urgent or emergent issues, a gastroenterologist can be reached at any hour by calling [phone]. Do not use MyChart messaging for urgent concerns.    DIET:  We do recommend a small meal at first, but then you may proceed to your regular diet.  Drink plenty of fluids but you should avoid alcoholic beverages for 24 hours.  ACTIVITY:  You should plan to take it easy for the rest of today and you  should NOT DRIVE or use heavy machinery until tomorrow (because of the sedation medicines used during the test).    FOLLOW UP: Our staff will call the number listed on your records the next business day following your procedure.  We will call around 7:15- 8:00 am to check on you and address any questions or concerns that you may have regarding the information given to you following your procedure. If we do not reach you, we will leave a message.     If any biopsies were taken you will be contacted by phone or by letter within the next 1-3 weeks.  Please call us at [phone] if you have not heard about the biopsies in 3 weeks.    SIGNATURES/CONFIDENTIALITY: You and/or your care partner have signed paperwork which will be entered into your electronic medical record.  These signatures attest to the fact that that the information above on your After Visit Summary has been reviewed and is understood.  Full responsibility of the confidentiality of this discharge information lies with you and/or your care-partner. 

## 2023-01-13 NOTE — Progress Notes (Signed)
Called to room to assist during endoscopic procedure.  Patient ID and intended procedure confirmed with present staff. Received instructions for my participation in the procedure from the performing physician.  

## 2023-01-13 NOTE — Op Note (Signed)
[facility] Patient Name: Kent Montgomery Procedure Date: 01/13/2023 2:46 PM MRN: [id number] Endoscopist: Beverley Fiedler , MD, [id number] Age: 67 Referring MD:  Date of Birth: 1956/05/17 Gender: Male Account #: [id number] Procedure:                Colonoscopy Indications:              High risk colon cancer surveillance: Personal                            history of multiple adenomas and SSPs, Last                            colonoscopy: May 2021 (7 adenomas and SSP) Medicines:                Monitored Anesthesia Care Procedure:                Pre-Anesthesia Assessment:                           - Prior to the procedure, a History and Physical                            was performed, and patient medications and                            allergies were reviewed. The patient's tolerance of                            previous anesthesia was also reviewed. The risks                            and benefits of the procedure and the sedation                            options and risks were discussed with the patient.                            All questions were answered, and informed consent                            was obtained. Prior Anticoagulants: The patient has                            taken no anticoagulant or antiplatelet agents. ASA                            Grade Assessment: II - A patient with mild systemic                            disease. After reviewing the risks and benefits,                            the patient was deemed in satisfactory condition to  undergo the procedure.                           After obtaining informed consent, the colonoscope                            was passed under direct vision. Throughout the                            procedure, the patient's blood pressure, pulse, and                            oxygen saturations were monitored continuously. The                            CF [id number] #[id number] was  introduced through the anus                            and advanced to the cecum, identified by                            appendiceal orifice and ileocecal valve. The                            colonoscopy was performed without difficulty. The                            patient tolerated the procedure well. The quality                            of the bowel preparation was good. The ileocecal                            valve, appendiceal orifice, and rectum were                            photographed. Scope In: 3:02:06 PM Scope Out: 3:20:12 PM Scope Withdrawal Time: 0 hours 16 minutes 32 seconds  Total Procedure Duration: 0 hours 18 minutes 6 seconds  Findings:                 The digital rectal exam was normal.                           Three sessile polyps were found in the ascending                            colon. The polyps were 3 to 6 mm in size. These                            polyps were removed with a cold snare. Resection                            and retrieval were complete.  Three sessile polyps were found in the transverse                            colon. The polyps were 4 to 5 mm in size. These                            polyps were removed with a cold snare. Resection                            and retrieval were complete.                           A few small-mouthed diverticula were found in the                            sigmoid colon and descending colon.                           Internal hemorrhoids were found during                            retroflexion. The hemorrhoids were small. Complications:            No immediate complications. Estimated Blood Loss:     Estimated blood loss was minimal. Impression:               - Three 3 to 6 mm polyps in the ascending colon,                            removed with a cold snare. Resected and retrieved.                           - Three 4 to 5 mm polyps in the transverse colon,                             removed with a cold snare. Resected and retrieved.                           - Mild diverticulosis in the sigmoid colon and in                            the descending colon.                           - Internal hemorrhoids. Recommendation:           - Patient has a contact number available for                            emergencies. The signs and symptoms of potential                            delayed complications were discussed with the  patient. Return to normal activities tomorrow.                            Written discharge instructions were provided to the                            patient.                           - Resume previous diet.                           - Continue present medications.                           - Await pathology results.                           - Repeat colonoscopy is recommended for                            surveillance. The colonoscopy date will be                            determined after pathology results from today's                            exam become available for review. Beverley Fiedler, MD 01/13/2023 3:23:52 PM This report has been signed electronically.

## 2023-01-13 NOTE — Progress Notes (Signed)
Pt's states no medical or surgical changes since previsit or office visit. 

## 2023-01-13 NOTE — Progress Notes (Signed)
Sedate, gd SR, tolerated procedure well, VSS, report to RN 

## 2023-01-13 NOTE — Progress Notes (Signed)
GASTROENTEROLOGY PROCEDURE H&P NOTE   Primary Care Physician: Pincus Sanes, MD    Reason for Procedure:   Personal hx of colon polyps (TA and SSP)  Plan:    colonoscopy  Patient is appropriate for endoscopic procedure(s) in the ambulatory ([facility]) setting.  The nature of the procedure, as well as the risks, benefits, and alternatives were carefully and thoroughly reviewed with the patient. Ample time for discussion and questions allowed. The patient understood, was satisfied, and agreed to proceed.     HPI: Kent Montgomery is a 67 y.o. male who presents for surveillance colonoscopy.  Medical history as below.  Tolerated the prep.  No recent chest pain or shortness of breath.  No abdominal pain today.  Past Medical History:  Diagnosis Date   Allergy    Blood transfusion without reported diagnosis    Colon polyp    Elevated transaminase level    GERD (gastroesophageal reflux disease)    History of kidney stones    Hyperlipidemia    Lower GI bleed    Sigmoid diverticulosis    Skin cancer    basil cell rt arm   Symptomatic anemia     Past Surgical History:  Procedure Laterality Date   APPENDECTOMY     ASPIRATION BIOPSY     of eye cyst   BACK SURGERY     BACK SURGERY     COLONOSCOPY N/A 01/28/2016   Procedure: COLONOSCOPY;  Surgeon: Ruffin Frederick, MD;  Location[facility] END[facility]COPY;  Service: Gastroenterology;  Laterality: N/A;   COLONOSCOPY  2015   ELBOW ARTHROSCOPY     ESOPHAGOGASTRODUODENOSCOPY N/A 01/28/2016   Procedure: ESOPHAGOGASTRODUODENOSCOPY (EGD);  Surgeon: Ruffin Frederick, MD;  Location: [facility] ENDOSCOPY;  Service: Gastroenterology;  Laterality: N/A;   GIVENS CAPSULE STUDY N/A 01/30/2016   Procedure: GIVENS CAPSULE STUDY;  Surgeon: Iva Boop, MD;  [facility] END[facility]COPY;  Service: Endoscopy;  Laterality: N/A;   MASS EXCISION Right 01/22/2018   Procedure: EXCISION OF 10 CM MASS ON RIGHT UPPER BACK ERAS PATHWAY;  Surgeon: Claud Kelp, MD;   Location: [facility] ORS;  Service: General;  Laterality: Right;   SMALL BOWEL ENTEROSCOPY N/A 02/2016   double balloon    Prior to Admission medications   Medication Sig Start Date End Date Taking? Authorizing Provider  acetaminophen (TYLENOL) 650 MG CR tablet Take 650 mg by mouth every 8 (eight) hours as needed for pain.   Yes [provider]  b complex vitamins tablet Take 1 tablet by mouth daily.   Yes [provider]  Coenzyme Q10 (COQ10 PO) Take 1 tablet by mouth daily.   Yes [provider]  MEGARED OMEGA-3 KRILL OIL PO Take 1 capsule by mouth daily.    Yes [provider]  Multiple Vitamin (MULTIVITAMIN WITH MINERALS) TABS tablet Take 1 tablet by mouth daily.   Yes [provider]  Probiotic Product (PROBIOTIC DAILY) CAPS Take 1 capsule by mouth daily.   Yes [provider]  PSYLLIUM HUSK PO Take 5-6 capsules by mouth 2 (two) times daily.    Yes [provider]  S-Adenosylmethionine (SAM-E) 400 MG TABS Take 800 mg by mouth daily.   Yes [provider]  TURMERIC PO Take 2 tablets by mouth daily.    Yes [provider]    Current Outpatient Medications  Medication Sig Dispense Refill   acetaminophen (TYLENOL) 650 MG CR tablet Take 650 mg by mouth every 8 (eight) hours as needed for pain.  b complex vitamins tablet Take 1 tablet by mouth daily.     Coenzyme Q10 (COQ10 PO) Take 1 tablet by mouth daily.     MEGARED OMEGA-3 KRILL OIL PO Take 1 capsule by mouth daily.      Multiple Vitamin (MULTIVITAMIN WITH MINERALS) TABS tablet Take 1 tablet by mouth daily.     Probiotic Product (PROBIOTIC DAILY) CAPS Take 1 capsule by mouth daily.     PSYLLIUM HUSK PO Take 5-6 capsules by mouth 2 (two) times daily.      S-Adenosylmethionine (SAM-E) 400 MG TABS Take 800 mg by mouth daily.     TURMERIC PO Take 2 tablets by mouth daily.      Current Facility-Administered Medications  Medication Dose Route Frequency Provider  Last Rate Last Admin   0.9 %  sodium chloride infusion  500 mL Intravenous Once Virgia Kelner, Carie Caddy, MD        Allergies as of 01/13/2023 - Review Complete 01/13/2023  Allergen Reaction Noted   Droperidol Anaphylaxis 10/26/2010   Pravastatin Other (See Comments) 02/13/2022   Cefuroxime axetil Rash 10/26/2010    Family History  Problem Relation Age of Onset   Cancer Maternal Grandfather        bladder   Prostate cancer Father    Colon polyps Father    Heart disease Father    Nephrolithiasis Father    Stomach cancer Father    Liver cancer Father    Colon cancer Neg Hx    Esophageal cancer Neg Hx    Rectal cancer Neg Hx     Social History   Socioeconomic History   Marital status: Married    Spouse name: Not on file   Number of children: 3   Years of education: Not on file   Highest education level: Not on file  Occupational History   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: [employer]  Tobacco Use   Smoking status: Never   Smokeless tobacco: Never  Vaping Use   Vaping status: Never Used  Substance and Sexual Activity   Alcohol use: Yes    Alcohol/week: 10.0 standard drinks of alcohol    Types: 10 Glasses of wine per week    Comment: Wine   Drug use: No   Sexual activity: Yes  Other Topics Concern   Not on file  Social History Narrative   Not on file   Social Determinants of Health   Financial Resource Strain: Not on file  Food Insecurity: Not on file  Transportation Needs: Not on file  Physical Activity: Not on file  Stress: Not on file  Social Connections: Not on file  Intimate Partner Violence: Not on file    Physical Exam: Vital signs in last 24 hours: @BP  106/66   Pulse 99   Temp 97.8 F (36.6 C)   Ht 5\' 10"  (1.778 m)   Wt 219 lb (99.3 kg)   SpO2 96%   BMI 31.42 kg/m  GEN: NAD EYE: Sclerae anicteric ENT: MMM CV: Non-tachycardic Pulm: CTA b/l GI: Soft, NT/ND NEURO:  Alert & Oriented x 3   Erick Blinks, MD [place]  [facility]  01/13/2023 2:54 PM

## 2023-01-14 ENCOUNTER — Telehealth: Payer: Self-pay

## 2023-01-14 NOTE — Telephone Encounter (Signed)
  Follow up Call-     01/13/2023    2:19 PM  Call back number  Post procedure Call Back phone  # [phone]  Permission to leave phone message Yes     Patient questions:  Do you have a fever, pain , or abdominal swelling? No. Pain Score  0 *  Have you tolerated food without any problems? Yes.    Have you been able to return to your normal activities? Yes.    Do you have any questions about your discharge instructions: Diet   No. Medications  No. Follow up visit  No.  Do you have questions or concerns about your Care? No.  Actions: * If pain score is 4 or above: No action needed, pain <4.

## 2023-01-15 ENCOUNTER — Encounter: Payer: Self-pay | Admitting: Internal Medicine

## 2023-01-16 ENCOUNTER — Encounter: Payer: Self-pay | Admitting: Internal Medicine

## 2023-01-16 ENCOUNTER — Other Ambulatory Visit: Payer: Self-pay

## 2023-01-16 DIAGNOSIS — E7849 Other hyperlipidemia: Secondary | ICD-10-CM

## 2023-01-16 DIAGNOSIS — R739 Hyperglycemia, unspecified: Secondary | ICD-10-CM

## 2023-01-16 DIAGNOSIS — Z0000 Encounter for general adult medical examination without abnormal findings: Secondary | ICD-10-CM

## 2023-01-23 ENCOUNTER — Other Ambulatory Visit (INDEPENDENT_AMBULATORY_CARE_PROVIDER_SITE_OTHER): Payer: Commercial Managed Care - PPO

## 2023-01-23 DIAGNOSIS — Z0000 Encounter for general adult medical examination without abnormal findings: Secondary | ICD-10-CM | POA: Diagnosis not present

## 2023-01-23 DIAGNOSIS — E7849 Other hyperlipidemia: Secondary | ICD-10-CM | POA: Diagnosis not present

## 2023-01-23 DIAGNOSIS — R739 Hyperglycemia, unspecified: Secondary | ICD-10-CM | POA: Diagnosis not present

## 2023-01-23 LAB — COMPREHENSIVE METABOLIC PANEL
ALT: 41 U/L (ref 0–53)
AST: 29 U/L (ref 0–37)
Albumin: 4.3 g/dL (ref 3.5–5.2)
Alkaline Phosphatase: 105 U/L (ref 39–117)
BUN: 16 mg/dL (ref 6–23)
CO2: 27 mEq/L (ref 19–32)
Calcium: 8.7 mg/dL (ref 8.4–10.5)
Chloride: 104 mEq/L (ref 96–112)
Creatinine, Ser: 1.22 mg/dL (ref 0.40–1.50)
GFR: 62.01 mL/min (ref >60.00)
Glucose, Bld: 101 mg/dL — ABNORMAL HIGH (ref 70–99)
Potassium: 4.2 mEq/L (ref 3.5–5.1)
Sodium: 138 mEq/L (ref 135–145)
Total Bilirubin: 0.5 mg/dL (ref 0.2–1.2)
Total Protein: 7.1 g/dL (ref 6.0–8.3)

## 2023-01-23 LAB — LIPID PANEL
Cholesterol: 247 mg/dL — ABNORMAL HIGH (ref 0–200)
HDL: 62.3 mg/dL (ref >39.00)
LDL Cholesterol: 165 mg/dL — ABNORMAL HIGH (ref 0–99)
NonHDL: 185.09
Total CHOL/HDL Ratio: 4
Triglycerides: 99 mg/dL (ref 0.0–149.0)
VLDL: 19.8 mg/dL (ref 0.0–40.0)

## 2023-01-23 LAB — CBC WITH DIFFERENTIAL/PLATELET
Basophils Absolute: 0 10*3/uL (ref 0.0–0.1)
Basophils Relative: 1.1 % (ref 0.0–3.0)
Eosinophils Absolute: 0.2 10*3/uL (ref 0.0–0.7)
Eosinophils Relative: 3.8 % (ref 0.0–5.0)
HCT: 48.2 % (ref 39.0–52.0)
Hemoglobin: 16.1 g/dL (ref 13.0–17.0)
Lymphocytes Relative: 29.7 % (ref 12.0–46.0)
Lymphs Abs: 1.3 10*3/uL (ref 0.7–4.0)
MCHC: 33.3 g/dL (ref 30.0–36.0)
MCV: 92.3 fl (ref 78.0–100.0)
Monocytes Absolute: 0.5 10*3/uL (ref 0.1–1.0)
Monocytes Relative: 11.6 % (ref 3.0–12.0)
Neutro Abs: 2.3 10*3/uL (ref 1.4–7.7)
Neutrophils Relative %: 53.8 % (ref 43.0–77.0)
Platelets: 189 10*3/uL (ref 150.0–400.0)
RBC: 5.22 Mil/uL (ref 4.22–5.81)
RDW: 12.9 % (ref 11.5–15.5)
WBC: 4.2 10*3/uL (ref 4.0–10.5)

## 2023-01-23 LAB — TSH: TSH: 0.99 u[IU]/mL (ref 0.35–5.50)

## 2023-01-23 LAB — HEMOGLOBIN A1C: Hgb A1c MFr Bld: 5.7 % (ref 4.6–6.5)

## 2023-01-24 ENCOUNTER — Encounter: Payer: Commercial Managed Care - PPO | Admitting: Internal Medicine

## 2023-02-02 ENCOUNTER — Encounter: Payer: Self-pay | Admitting: Internal Medicine

## 2023-02-02 NOTE — Patient Instructions (Addendum)
Prevnar pneumonia vaccine today    Blood work was ordered.   The lab is on the first floor.    Medications changes include :   none     Return in about 1 year (around 02/03/2024) for Physical Exam.   Health Maintenance, Male Adopting a healthy lifestyle and getting preventive care are important in promoting health and wellness. Ask your health care provider about: The right schedule for you to have regular tests and exams. Things you can do on your own to prevent diseases and keep yourself healthy. What should I know about diet, weight, and exercise? Eat a healthy diet  Eat a diet that includes plenty of vegetables, fruits, low-fat dairy products, and lean protein. Do not eat a lot of foods that are high in solid fats, added sugars, or sodium. Maintain a healthy weight Body mass index (BMI) is a measurement that can be used to identify possible weight problems. It estimates body fat based on height and weight. Your health care provider can help determine your BMI and help you achieve or maintain a healthy weight. Get regular exercise Get regular exercise. This is one of the most important things you can do for your health. Most adults should: Exercise for at least 150 minutes each week. The exercise should increase your heart rate and make you sweat (moderate-intensity exercise). Do strengthening exercises at least twice a week. This is in addition to the moderate-intensity exercise. Spend less time sitting. Even light physical activity can be beneficial. Watch cholesterol and blood lipids Have your blood tested for lipids and cholesterol at 66 years of age, then have this test every 5 years. You may need to have your cholesterol levels checked more often if: Your lipid or cholesterol levels are high. You are older than 66 years of age. You are at high risk for heart disease. What should I know about cancer screening? Many types of cancers can be detected early and may  often be prevented. Depending on your health history and family history, you may need to have cancer screening at various ages. This may include screening for: Colorectal cancer. Prostate cancer. Skin cancer. Lung cancer. What should I know about heart disease, diabetes, and high blood pressure? Blood pressure and heart disease High blood pressure causes heart disease and increases the risk of stroke. This is more likely to develop in people who have high blood pressure readings or are overweight. Talk with your health care provider about your target blood pressure readings. Have your blood pressure checked: Every 3-5 years if you are 35-57 years of age. Every year if you are 68 years old or older. If you are between the ages of 53 and 63 and are a current or former smoker, ask your health care provider if you should have a one-time screening for abdominal aortic aneurysm (AAA). Diabetes Have regular diabetes screenings. This checks your fasting blood sugar level. Have the screening done: Once every three years after age 42 if you are at a normal weight and have a low risk for diabetes. More often and at a younger age if you are overweight or have a high risk for diabetes. What should I know about preventing infection? Hepatitis B If you have a higher risk for hepatitis B, you should be screened for this virus. Talk with your health care provider to find out if you are at risk for hepatitis B infection. Hepatitis C Blood testing is recommended for: Everyone born from [birth year] through  21. Anyone with known risk factors for hepatitis C. Sexually transmitted infections (STIs) You should be screened each year for STIs, including gonorrhea and chlamydia, if: You are sexually active and are younger than 66 years of age. You are older than 66 years of age and your health care provider tells you that you are at risk for this type of infection. Your sexual activity has changed since you were last  screened, and you are at increased risk for chlamydia or gonorrhea. Ask your health care provider if you are at risk. Ask your health care provider about whether you are at high risk for HIV. Your health care provider may recommend a prescription medicine to help prevent HIV infection. If you choose to take medicine to prevent HIV, you should first get tested for HIV. You should then be tested every 3 months for as long as you are taking the medicine. Follow these instructions at home: Alcohol use Do not drink alcohol if your health care provider tells you not to drink. If you drink alcohol: Limit how much you have to 0-2 drinks a day. Know how much alcohol is in your drink. In the [place] one drink equals one 12 oz bottle of beer (355 mL), one 5 oz glass of wine (148 mL), or one 1 oz glass of hard liquor (44 mL). Lifestyle Do not use any products that contain nicotine or tobacco. These products include cigarettes, chewing tobacco, and vaping devices, such as e-cigarettes. If you need help quitting, ask your health care provider. Do not use street drugs. Do not share needles. Ask your health care provider for help if you need support or information about quitting drugs. General instructions Schedule regular health, dental, and eye exams. Stay current with your vaccines. Tell your health care provider if: You often feel depressed. You have ever been abused or do not feel safe at home. Summary Adopting a healthy lifestyle and getting preventive care are important in promoting health and wellness. Follow your health care provider's instructions about healthy diet, exercising, and getting tested or screened for diseases. Follow your health care provider's instructions on monitoring your cholesterol and blood pressure. This information is not intended to replace advice given to you by your health care provider. Make sure you discuss any questions you have with your health care provider. Document  Revised: 10/30/2020 Document Reviewed: 10/30/2020 Elsevier Patient Education  2024 [employer].

## 2023-02-02 NOTE — Progress Notes (Unsigned)
Subjective:    Patient ID: Kent Montgomery, male    DOB: 28-Dec-1955, 67 y.o.   MRN: [id number]     HPI Kent Montgomery is here for a physical exam and his chronic medical problems.  BP elevated - typically not elevated.     Lump - top of head - no pain.  Has upcoming derm appt.  Below left ankle - tingling -? Related to back    Medications and allergies reviewed with patient and updated if appropriate.  Current Outpatient Medications on File Prior to Visit  Medication Sig Dispense Refill   acetaminophen (TYLENOL) 650 MG CR tablet Take 650 mg by mouth every 8 (eight) hours as needed for pain.     b complex vitamins tablet Take 1 tablet by mouth daily.     Coenzyme Q10 (COQ10 PO) Take 1 tablet by mouth daily.     MEGARED OMEGA-3 KRILL OIL PO Take 1 capsule by mouth daily.      Multiple Vitamin (MULTIVITAMIN WITH MINERALS) TABS tablet Take 1 tablet by mouth daily.     Probiotic Product (PROBIOTIC DAILY) CAPS Take 1 capsule by mouth daily.     PSYLLIUM HUSK PO Take 5-6 capsules by mouth 2 (two) times daily.      S-Adenosylmethionine (SAM-E) 400 MG TABS Take 800 mg by mouth daily.     TURMERIC PO Take 2 tablets by mouth daily.      No current facility-administered medications on file prior to visit.    Review of Systems  Constitutional:  Negative for fever.  Eyes:  Negative for visual disturbance.  Respiratory:  Negative for cough, shortness of breath and wheezing.   Cardiovascular:  Negative for chest pain, palpitations and leg swelling.  Gastrointestinal:  Positive for abdominal pain (occ RUQ pain). Negative for blood in stool, constipation and diarrhea.       No gerd  Genitourinary:  Negative for difficulty urinating, dysuria and hematuria.  Musculoskeletal:  Positive for arthralgias (mild). Negative for back pain.  Skin:  Negative for rash.  Neurological:  Negative for light-headedness and headaches.  Psychiatric/Behavioral:  Negative for dysphoric mood. The patient is not  nervous/anxious.        Objective:   Vitals:   02/03/23 1536 02/03/23 1614  BP: (!) 144/90 128/72  Pulse: 82   Temp: 98.1 F (36.7 C)   SpO2: 97%    Filed Weights   02/03/23 1536  Weight: 233 lb 6.4 oz (105.9 kg)   Body mass index is 33.49 kg/m.  BP Readings from Last 3 Encounters:  02/03/23 128/72  01/13/23 126/71  09/12/22 118/80    Wt Readings from Last 3 Encounters:  02/03/23 233 lb 6.4 oz (105.9 kg)  01/13/23 219 lb (99.3 kg)  12/18/22 219 lb (99.3 kg)      Physical Exam Constitutional: He appears well-developed and well-nourished. No distress.  HENT:  Head: Normocephalic and atraumatic.  Right Ear: External ear normal.  Left Ear: External ear normal.  Normal ear canals and TM b/l  Mouth/Throat: Oropharynx is clear and moist. Eyes: Conjunctivae and EOM are normal.  Neck: Neck supple. No tracheal deviation present. No thyromegaly present.  No carotid bruit  Cardiovascular: Normal rate, regular rhythm, normal heart sounds and intact distal pulses.   No murmur heard.  No lower extremity edema. Pulmonary/Chest: Effort normal and breath sounds normal. No respiratory distress. He has no wheezes. He has no rales.  Abdominal: Soft. He exhibits no distension. There is no tenderness.  Genitourinary: deferred  Lymphadenopathy:   He has no cervical adenopathy.  Skin: Skin is warm and dry. He is not diaphoretic.  Psychiatric: He has a normal mood and affect. His behavior is normal.         Assessment & Plan:   Physical exam: Screening blood work  ordered Exercise   good - regular Weight  gained weight - related to vacations - plans on losing weight Substance abuse   none   Reviewed recommended immunizations. Prevnar 20 today   Health Maintenance  Topic Date Due   DTaP/Tdap/Td (1 - Tdap) Never done   COVID-19 Vaccine (3 - 2023-24 season) 02/22/2022   INFLUENZA VACCINE  09/22/2023 (Originally 01/23/2023)   Colonoscopy  01/12/2026   Pneumonia Vaccine 53+  Years old  Completed   Hepatitis C Screening  Completed   HPV VACCINES  Aged Out   Zoster Vaccines- Shingrix  Discontinued     See Problem List for Assessment and Plan of chronic medical problems.

## 2023-02-03 ENCOUNTER — Encounter: Payer: Self-pay | Admitting: Internal Medicine

## 2023-02-03 ENCOUNTER — Ambulatory Visit (INDEPENDENT_AMBULATORY_CARE_PROVIDER_SITE_OTHER): Payer: Commercial Managed Care - PPO | Admitting: Internal Medicine

## 2023-02-03 VITALS — BP 128/72 | HR 82 | Temp 98.1°F | Ht 70.0 in | Wt 233.4 lb

## 2023-02-03 DIAGNOSIS — Z0000 Encounter for general adult medical examination without abnormal findings: Secondary | ICD-10-CM

## 2023-02-03 DIAGNOSIS — R739 Hyperglycemia, unspecified: Secondary | ICD-10-CM

## 2023-02-03 DIAGNOSIS — Z125 Encounter for screening for malignant neoplasm of prostate: Secondary | ICD-10-CM | POA: Diagnosis not present

## 2023-02-03 DIAGNOSIS — R931 Abnormal findings on diagnostic imaging of heart and coronary circulation: Secondary | ICD-10-CM | POA: Diagnosis not present

## 2023-02-03 DIAGNOSIS — Z23 Encounter for immunization: Secondary | ICD-10-CM | POA: Diagnosis not present

## 2023-02-03 DIAGNOSIS — E7849 Other hyperlipidemia: Secondary | ICD-10-CM

## 2023-02-03 DIAGNOSIS — E538 Deficiency of other specified B group vitamins: Secondary | ICD-10-CM | POA: Diagnosis not present

## 2023-02-03 NOTE — Assessment & Plan Note (Addendum)
Chronic Lab Results  Component Value Date   HGBA1C 5.7 01/23/2023    Low sugar / carb diet Continue regular exercise

## 2023-02-03 NOTE — Assessment & Plan Note (Signed)
Chronic Has tried multiple statins -cholesterol improved, but liver tests became elevated and he had significant joint pain CT coronary artery scan with score of 29 Continue lifestyle control

## 2023-02-03 NOTE — Assessment & Plan Note (Signed)
Chronic Very mild CAD, nonobstructive Asymptomatic Has not tolerated statins in the past-lipids controlled with lifestyle Continue regular exercise, healthy diet

## 2023-02-03 NOTE — Assessment & Plan Note (Addendum)
Chronic Taking supplementation Check B12 level

## 2023-02-05 ENCOUNTER — Encounter: Payer: Self-pay | Admitting: Internal Medicine

## 2023-02-06 ENCOUNTER — Encounter (INDEPENDENT_AMBULATORY_CARE_PROVIDER_SITE_OTHER): Payer: Self-pay

## 2023-02-06 ENCOUNTER — Other Ambulatory Visit: Payer: Self-pay | Admitting: Internal Medicine

## 2023-02-06 ENCOUNTER — Encounter: Payer: Self-pay | Admitting: Internal Medicine

## 2023-02-06 ENCOUNTER — Ambulatory Visit (INDEPENDENT_AMBULATORY_CARE_PROVIDER_SITE_OTHER): Payer: Commercial Managed Care - PPO

## 2023-02-06 DIAGNOSIS — E538 Deficiency of other specified B group vitamins: Secondary | ICD-10-CM | POA: Diagnosis not present

## 2023-02-06 MED ORDER — CYANOCOBALAMIN 1000 MCG/ML IJ SOLN
1000.0000 ug | Freq: Once | INTRAMUSCULAR | Status: AC
Start: 2023-02-06 — End: 2023-02-06
  Administered 2023-02-06: 1000 ug via INTRAMUSCULAR

## 2023-02-06 NOTE — Progress Notes (Addendum)
Pt here for monthly B12 injection per   B12 given IM and pt tolerated injection well.   Medical screening examination/treatment/procedure(s) were performed by non-physician practitioner and as supervising physician I was immediately available for consultation/collaboration.  I agree with above. Jacinta Shoe, MD

## 2023-03-13 ENCOUNTER — Ambulatory Visit (INDEPENDENT_AMBULATORY_CARE_PROVIDER_SITE_OTHER): Payer: Commercial Managed Care - PPO | Admitting: *Deleted

## 2023-03-13 DIAGNOSIS — E538 Deficiency of other specified B group vitamins: Secondary | ICD-10-CM | POA: Diagnosis not present

## 2023-03-13 MED ORDER — CYANOCOBALAMIN 1000 MCG/ML IJ SOLN
1000.0000 ug | Freq: Once | INTRAMUSCULAR | Status: AC
Start: 2023-03-13 — End: 2023-03-13
  Administered 2023-03-13: 1000 ug via INTRAMUSCULAR

## 2023-03-13 NOTE — Progress Notes (Signed)
Patient is here to get his monthly B12 injection. Given in left deltoid. Patient tolerated well

## 2023-04-02 DIAGNOSIS — Z85828 Personal history of other malignant neoplasm of skin: Secondary | ICD-10-CM | POA: Diagnosis not present

## 2023-04-02 DIAGNOSIS — L738 Other specified follicular disorders: Secondary | ICD-10-CM | POA: Diagnosis not present

## 2023-04-02 DIAGNOSIS — L400 Psoriasis vulgaris: Secondary | ICD-10-CM | POA: Diagnosis not present

## 2023-04-02 DIAGNOSIS — D225 Melanocytic nevi of trunk: Secondary | ICD-10-CM | POA: Diagnosis not present

## 2023-04-02 DIAGNOSIS — D485 Neoplasm of uncertain behavior of skin: Secondary | ICD-10-CM | POA: Diagnosis not present

## 2023-04-02 DIAGNOSIS — L821 Other seborrheic keratosis: Secondary | ICD-10-CM | POA: Diagnosis not present

## 2023-04-02 DIAGNOSIS — L720 Epidermal cyst: Secondary | ICD-10-CM | POA: Diagnosis not present

## 2023-04-02 DIAGNOSIS — L986 Other infiltrative disorders of the skin and subcutaneous tissue: Secondary | ICD-10-CM | POA: Diagnosis not present

## 2023-04-14 ENCOUNTER — Ambulatory Visit (INDEPENDENT_AMBULATORY_CARE_PROVIDER_SITE_OTHER): Payer: Commercial Managed Care - PPO

## 2023-04-14 DIAGNOSIS — E538 Deficiency of other specified B group vitamins: Secondary | ICD-10-CM

## 2023-04-14 MED ORDER — CYANOCOBALAMIN 1000 MCG/ML IJ SOLN
1000.0000 ug | Freq: Once | INTRAMUSCULAR | Status: AC
Start: 2023-04-14 — End: 2023-04-14
  Administered 2023-04-14: 1000 ug via INTRAMUSCULAR

## 2023-04-14 NOTE — Progress Notes (Signed)
Pt was given 3 of 6 monthly B12 injection.

## 2023-05-02 ENCOUNTER — Encounter: Payer: Self-pay | Admitting: *Deleted

## 2023-05-02 NOTE — Progress Notes (Signed)
PATIENT NAVIGATOR PROGRESS NOTE  Name: [name] Date: 05/02/2023 MRN: [id number]  DOB: February 04, 1956   Reason for visit:  New Patient appt  Comments:  Spoke with Dr Melvyn Neth and have him scheduled with Dr Truett Perna on 05/06/23 at 1:40pm  Reviewed directions to building and parking. Given contact number to call with any questions    Time spent counseling/coordinating care: 30-45 minutes

## 2023-05-06 ENCOUNTER — Inpatient Hospital Stay: Payer: Commercial Managed Care - PPO

## 2023-05-06 ENCOUNTER — Other Ambulatory Visit: Payer: Self-pay | Admitting: *Deleted

## 2023-05-06 ENCOUNTER — Encounter: Payer: Self-pay | Admitting: *Deleted

## 2023-05-06 ENCOUNTER — Inpatient Hospital Stay: Payer: Commercial Managed Care - PPO | Attending: Oncology | Admitting: Oncology

## 2023-05-06 VITALS — BP 128/85 | HR 95 | Temp 98.1°F | Resp 18 | Ht 70.0 in | Wt 235.0 lb

## 2023-05-06 DIAGNOSIS — E538 Deficiency of other specified B group vitamins: Secondary | ICD-10-CM | POA: Diagnosis not present

## 2023-05-06 DIAGNOSIS — E785 Hyperlipidemia, unspecified: Secondary | ICD-10-CM | POA: Diagnosis not present

## 2023-05-06 DIAGNOSIS — Z8042 Family history of malignant neoplasm of prostate: Secondary | ICD-10-CM | POA: Insufficient documentation

## 2023-05-06 DIAGNOSIS — Z800 Family history of malignant neoplasm of digestive organs: Secondary | ICD-10-CM | POA: Diagnosis not present

## 2023-05-06 DIAGNOSIS — Z8052 Family history of malignant neoplasm of bladder: Secondary | ICD-10-CM | POA: Diagnosis not present

## 2023-05-06 DIAGNOSIS — Z803 Family history of malignant neoplasm of breast: Secondary | ICD-10-CM | POA: Diagnosis not present

## 2023-05-06 DIAGNOSIS — C8511 Unspecified B-cell lymphoma, lymph nodes of head, face, and neck: Secondary | ICD-10-CM | POA: Diagnosis not present

## 2023-05-06 DIAGNOSIS — C84A1 Cutaneous T-cell lymphoma, unspecified lymph nodes of head, face, and neck: Secondary | ICD-10-CM

## 2023-05-06 DIAGNOSIS — Z8719 Personal history of other diseases of the digestive system: Secondary | ICD-10-CM | POA: Diagnosis not present

## 2023-05-06 LAB — CBC WITH DIFFERENTIAL (CANCER CENTER ONLY)
Abs Immature Granulocytes: 0.02 10*3/uL (ref 0.00–0.07)
Basophils Absolute: 0 10*3/uL (ref 0.0–0.1)
Basophils Relative: 1 %
Eosinophils Absolute: 0.2 10*3/uL (ref 0.0–0.5)
Eosinophils Relative: 3 %
HCT: 47.1 % (ref 39.0–52.0)
Hemoglobin: 16.4 g/dL (ref 13.0–17.0)
Immature Granulocytes: 0 %
Lymphocytes Relative: 25 %
Lymphs Abs: 1.8 10*3/uL (ref 0.7–4.0)
MCH: 30.6 pg (ref 26.0–34.0)
MCHC: 34.8 g/dL (ref 30.0–36.0)
MCV: 87.9 fL (ref 80.0–100.0)
Monocytes Absolute: 0.9 10*3/uL (ref 0.1–1.0)
Monocytes Relative: 12 %
Neutro Abs: 4.1 10*3/uL (ref 1.7–7.7)
Neutrophils Relative %: 59 %
Platelet Count: 249 10*3/uL (ref 150–400)
RBC: 5.36 MIL/uL (ref 4.22–5.81)
RDW: 12.4 % (ref 11.5–15.5)
WBC Count: 7 10*3/uL (ref 4.0–10.5)
nRBC: 0 % (ref 0.0–0.2)

## 2023-05-06 LAB — CMP (CANCER CENTER ONLY)
ALT: 35 U/L (ref 0–44)
AST: 23 U/L (ref 15–41)
Albumin: 4.4 g/dL (ref 3.5–5.0)
Alkaline Phosphatase: 110 U/L (ref 38–126)
Anion gap: 7 (ref 5–15)
BUN: 15 mg/dL (ref 8–23)
CO2: 30 mmol/L (ref 22–32)
Calcium: 9.6 mg/dL (ref 8.9–10.3)
Chloride: 102 mmol/L (ref 98–111)
Creatinine: 1.23 mg/dL (ref 0.61–1.24)
GFR, Estimated: >60 mL/min (ref >60)
Glucose, Bld: 75 mg/dL (ref 70–99)
Potassium: 3.9 mmol/L (ref 3.5–5.1)
Sodium: 139 mmol/L (ref 135–145)
Total Bilirubin: 0.7 mg/dL (ref <1.2)
Total Protein: 7.8 g/dL (ref 6.5–8.1)

## 2023-05-06 LAB — LACTATE DEHYDROGENASE: LDH: 206 U/L — ABNORMAL HIGH (ref 98–192)

## 2023-05-06 NOTE — Progress Notes (Signed)
PATIENT NAVIGATOR PROGRESS NOTE  Name: Delia Bedillion Date: 05/06/2023 MRN: [id number]  DOB: 01-22-1956   Reason for visit:  New Patient appt  Comments:  Met with Dr Melvyn Neth during visit with Dr Truett Perna  Lab work today PET scan scheduled for 11/22 arrival time of 7am and instructions given and reviewed Referral for Radiation consult entered Contact information given and encouraged him to call with any issues or questions    Time spent counseling/coordinating care: > 60 minutes

## 2023-05-06 NOTE — Progress Notes (Signed)
[facility] New Patient Consult   Requesting MD: Arminda Resides, Md [address] [place],  [place] [address]   Kent Montgomery 68 y.o.  [birth year]    Reason for Consult: Continuous B-cell lymphoma   HPI: Dr. Melvyn Neth reports noting a raised lesion at the parietal scalp in July of this year.  He saw Dr. Yetta Barre for a routine skin exam 04/02/2023.  A 20 x 15 mm indurated mildly erythematous papule is notated at the superior central scalp vertex.  A punch biopsy was obtained. He reports the lesion grew between July and October.  He feels the lesion has grown since the biopsy last month.  He is concerned there are additional similar lesions over the parietal scalp.  The pathology from the 04/02/2023 biopsy revealed an atypical lymphocytic infiltrate.  The majority of the infiltrate is composed of small lymphocytes associated with abundant plasma cells.  The B-cell infiltrate is CD20 and CD79 a positive.  CD30 is negative.  A fungal stain was negative.  The plasma cells are kappa restricted.  The differential diagnosis includes marginal zone lymphoma.  The tissue was referred for B-cell clonality studies and returned positive.  Dr. Melvyn Neth feels well.  Past Medical History:  Diagnosis Date   Allergy    Blood transfusion without reported diagnosis    Colon polyp    Elevated transaminase level    GERD (gastroesophageal reflux disease)    History of kidney stones    Hyperlipidemia    Lower GI bleed 2017   Sigmoid diverticulosis    Skin cancer    basil cell rt arm   Symptomatic anemia     .  B12 deficiency   .  Mononucleosis twice  Past Surgical History:  Procedure Laterality Date   APPENDECTOMY     ASPIRATION BIOPSY     of eye cyst   BACK SURGERY     BACK SURGERY     COLONOSCOPY N/A 01/28/2016   Procedure: COLONOSCOPY;  Surgeon: Ruffin Frederick, MD;  Location: [facility] ENDOSCOPY;  Service: Gastroenterology;  Laterality: N/A;   COLONOSCOPY  2015   ELBOW ARTHROSCOPY      ESOPHAGOGASTRODUODENOSCOPY N/A 01/28/2016   Procedure: ESOPHAGOGASTRODUODENOSCOPY (EGD);  Surgeon: Ruffin Frederick, MD;  Location[facility] ENDOSCOPY;  Service: Gastroenterology;  Laterality: N/A;   GIVENS CAPSULE STUDY N/A 01/30/2016   Procedure: GIVENS CAPSULE STUDY;  Surgeon: Iva Boop, MD;  Location: [facility] ENDOSCOPY;  Service: Endoscopy;  Laterality: N/A;   MASS EXCISION Right 01/22/2018   Procedure: EXCISION OF 10 CM MASS ON RIGHT UPPER BACK ERAS PATHWAY;  Surgeon: Claud Kelp, MD;  Location: [facility] ORS;  Service: General;  Laterality: Right;   SMALL BOWEL ENTEROSCOPY N/A 02/2016   double balloon    Medications: Reviewed  Allergies:  Allergies  Allergen Reactions   Droperidol Anaphylaxis   Pravastatin Other (See Comments)    Joint pain, elevated LFTs   Cefuroxime Axetil Rash    Family history: Father died of gastric cancer and had a history of prostate cancer.  He has paternal grandfather had bladder cancer.  His mother had breast cancer.  Social History:   He is a Web designer.  He lives with his wife and daughter in [place].  He does not use cigarettes.  He reports moderate alcohol use.  He received Red cell transfusions in 2017.  ROS:   Positives include: 10 pound weight gain Summer 2024, scalp lesion beginning July 2024  A complete ROS was otherwise negative.  Physical  Exam:  Blood pressure 128/85, pulse 95, temperature 98.1 F (36.7 C), temperature source Temporal, resp. rate 18, height 5\' 10"  (1.778 m), weight 235 lb (106.6 kg), SpO2 100%.  HEENT: Oropharynx without mass, neck without mass Lungs: Clear bilaterally Cardiac: Regular rate and rhythm Abdomen: No hepatosplenomegaly GU: Testes without mass Vascular: No leg edema Lymph nodes: No cervical, supraclavicular, axillary, or inguinal nodes Neurologic: Alert and oriented, motor exam appears intact in the upper lower extremities bilaterally Skin: Multiple cherry moles and benign-appearing moles over  the trunk.  2-3 cm light purple slightly raised lesion over the mid parietal scalp with a biopsy defect anteriorly, posterior to this lesion there is a less than 1 cm light purple slightly raised area, left posterior parietal scalp less than 1 cm raised lesion, round 1 cm tan/yellow lesion at the left temporal scalp Musculoskeletal: No spine tenderness   LAB:  CBC  Lab Results  Component Value Date   WBC 4.2 01/23/2023   HGB 16.1 01/23/2023   HCT 48.2 01/23/2023   MCV 92.3 01/23/2023   PLT 189.0 01/23/2023   NEUTROABS 2.3 01/23/2023        CMP  Lab Results  Component Value Date   NA 138 01/23/2023   K 4.2 01/23/2023   CL 104 01/23/2023   CO2 27 01/23/2023   GLUCOSE 101 (H) 01/23/2023   BUN 16 01/23/2023   CREATININE 1.22 01/23/2023   CALCIUM 8.7 01/23/2023   PROT 7.1 01/23/2023   ALBUMIN 4.3 01/23/2023   AST 29 01/23/2023   ALT 41 01/23/2023   ALKPHOS 105 01/23/2023   BILITOT 0.5 01/23/2023   GFRNONAA >60 01/09/2018   GFRAA >60 01/09/2018       Assessment/Plan:   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 Melvyn Neth presents with a raised lesion at the parietal scalp with a biopsy confirming an atypical B-cell infiltrate with a positive clonality study.  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  There is no clinical evidence of systemic disease.  It is unclear whether the additional parietal scalp lesions represent the same process.  We discussed treatment of cutaneous B-cell lymphoma including observation, radiation, surgical excision,  and rituximab.  Dr. Melvyn Neth will return to the lab for baseline studies to include a CBC, myeloma panel, and beta-2 microglobulin.  He will be scheduled for a whole-body PET.  I will refer him to radiation oncology to consider the indication for definitive radiation.  Will ask radiation oncology about potential toxicities associated with radiation including alopecia.  He will return for an office visit after the staging PET scan.  Thornton Papas, MD  05/06/2023, 2:04 PM

## 2023-05-07 LAB — BETA 2 MICROGLOBULIN, SERUM: Beta-2 Microglobulin: 1.7 mg/L (ref 0.6–2.4)

## 2023-05-08 ENCOUNTER — Telehealth: Payer: Self-pay | Admitting: Radiation Oncology

## 2023-05-08 DIAGNOSIS — H524 Presbyopia: Secondary | ICD-10-CM | POA: Diagnosis not present

## 2023-05-08 NOTE — Telephone Encounter (Signed)
Called patient to schedule a consultation w. Dr. Squire. No answer, LVM for a return call.  

## 2023-05-11 LAB — MULTIPLE MYELOMA PANEL, SERUM
Albumin SerPl Elph-Mcnc: 4.1 g/dL (ref 2.9–4.4)
Albumin/Glob SerPl: 1.4 (ref 0.7–1.7)
Alpha 1: 0.2 g/dL (ref 0.0–0.4)
Alpha2 Glob SerPl Elph-Mcnc: 0.7 g/dL (ref 0.4–1.0)
B-Globulin SerPl Elph-Mcnc: 1.2 g/dL (ref 0.7–1.3)
Gamma Glob SerPl Elph-Mcnc: 0.9 g/dL (ref 0.4–1.8)
Globulin, Total: 3.1 g/dL (ref 2.2–3.9)
IgA: 286 mg/dL (ref 61–437)
IgG (Immunoglobin G), Serum: 1025 mg/dL (ref 603–1613)
IgM (Immunoglobulin M), Srm: 51 mg/dL (ref 20–172)
Total Protein ELP: 7.2 g/dL (ref 6.0–8.5)

## 2023-05-15 ENCOUNTER — Ambulatory Visit (INDEPENDENT_AMBULATORY_CARE_PROVIDER_SITE_OTHER): Payer: Commercial Managed Care - PPO

## 2023-05-15 DIAGNOSIS — E538 Deficiency of other specified B group vitamins: Secondary | ICD-10-CM | POA: Diagnosis not present

## 2023-05-15 MED ORDER — CYANOCOBALAMIN 1000 MCG/ML IJ SOLN
1000.0000 ug | Freq: Once | INTRAMUSCULAR | Status: AC
  Administered 2023-05-15: 1000 ug via INTRAMUSCULAR

## 2023-05-15 NOTE — Progress Notes (Signed)
Pt received b12 injection w/o any complications.

## 2023-05-16 ENCOUNTER — Encounter (HOSPITAL_COMMUNITY)
Admission: RE | Admit: 2023-05-16 | Discharge: 2023-05-16 | Disposition: A | Discharge status: Home / Self Care | Payer: Commercial Managed Care - PPO | Source: Ambulatory Visit | Attending: Oncology | Admitting: Oncology

## 2023-05-16 DIAGNOSIS — C84A1 Cutaneous T-cell lymphoma, unspecified lymph nodes of head, face, and neck: Secondary | ICD-10-CM | POA: Insufficient documentation

## 2023-05-16 LAB — GLUCOSE, CAPILLARY: Glucose-Capillary: 121 mg/dL — ABNORMAL HIGH (ref 70–99)

## 2023-05-16 MED ORDER — FLUDEOXYGLUCOSE F - 18 (FDG) INJECTION
11.4300 | Freq: Once | INTRAVENOUS | Status: AC
  Administered 2023-05-16: 11.43 via INTRAVENOUS

## 2023-05-16 NOTE — Progress Notes (Signed)
Lymphoma Location(s) / Histology:  Parietal scalp lesion with a biopsy confirming an atypical B-cell infiltrate with a positive clonality study   Kent Montgomery presented with symptoms of: per Dr. Kalman Drape 05/06/23 note: "Kent Montgomery reports noting a raised lesion at the parietal scalp in July of this year.  He saw Dr. Yetta Barre for a routine skin exam 04/02/2023.  A 20 x 15 mm indurated mildly erythematous papule is notated at the superior central scalp vertex.  A punch biopsy was obtained. He reports the lesion grew between July and October."  PET Scan  05/16/2023 --Impression: 14 mm soft tissue lesion along the left anterior vertex, compatible with known scalp lymphoma.   Biopsies revealed:  04/02/2023   Past/Anticipated interventions by medical oncology, if any:  Under care of Dr. Thornton Papas 05/06/2023 --Disposition:  Kent Montgomery presents with a raised lesion at the parietal scalp with a biopsy confirming an atypical B-cell infiltrate with a positive clonality study.   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 There is no clinical evidence of systemic disease.   It is unclear whether the additional parietal scalp lesions represent the same process. We discussed treatment of cutaneous B-cell lymphoma including observation, radiation, surgical excision, and rituximab. Kent Montgomery will return to the lab for baseline studies to include a CBC, myeloma panel, and beta-2 microglobulin.   He will be scheduled for a whole-body PET. I will refer him to radiation oncology to consider the indication for definitive radiation.   Will ask radiation oncology about potential toxicities associated with radiation including alopecia. He will return for an office visit after the staging PET scan.   Weight changes, if any, over the past 6 months: None  Recurrent fevers, or drenching night sweats, if any:  No  SAFETY ISSUES: Prior radiation? No Pacemaker/ICD? No Possible current pregnancy? N/A Is the patient on methotrexate? N/a  Current Complaints / other details:

## 2023-05-18 NOTE — Progress Notes (Signed)
Radiation Oncology         [phone] ________________________________  Initial Outpatient Consultation  Name: Kent Montgomery MRN: [id number]  Date: 05/19/2023  DOB: Jul 10, 1955  AO:ZHYQM, Bobette Mo, MD  Ladene Artist, MD   REFERRING PHYSICIAN: Ladene Artist, MD  DIAGNOSIS: No diagnosis found.   Cancer Staging  No matching staging information was found for the patient.  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   CHIEF COMPLAINT: Here to discuss management of skin cancer   HISTORY OF PRESENT ILLNESS::Kent Montgomery is a 67 y.o. male who presented to his dermatologist, Dr. Yetta Barre, on 04/02/23 for a routine skin check. During that time, the patient brought to attention multiple skin lesions on the scalp which had been present for several years, as well as a newer increasing raised lesion located on the crown of his scalp which had been present since July of this year.   A skin punch biopsy was obtained on the superior central scalp lesion during that visit which showed atypical B-cell lymphoproliferative infiltrate. Diagnostic comments further indicated: The majority of the infiltrate as composed of small lymphocytes associated with abundant plasma cells; B-cell infiltrate positive for CD20 and CD79; CD30 negative; fungal stain negative; kappa restricted plasma cells. The differential diagnosis was noted to include marginal zone lymphoma. The tissue was also referred for B-cell clonality studies and returned positive.    He was subsequently referred to Dr. Truett Perna at [facility] medical oncology on 05/06/23 for further management. During which time, the patient reported noticing growth of the lesion since his biopsy. Treatment options discussed at that time included observation, vs radiation, surgical excision, and rituximab. He is interested in pursuing radiation therapy to the  lesion which we will discuss in detail today.   Baseline studies including CBC, myeloma panel, and beta-2 microglobulin were obtained and sent off on 11/12 during his visit with Dr. Truett Perna. His results were rather unremarkable.   Pertinent imaging performed thus far includes a PET scan on 05/16/23 which demonstrates the 14 mm soft tissue lesion along the left anterior scalp vertex, compatible with the known site of lymphoma. PET otherwise shows no evidence of metastatic disease.   PREVIOUS RADIATION THERAPY: No  PAST MEDICAL HISTORY:  has a past medical history of Allergy, Blood transfusion without reported diagnosis, Colon polyp, Elevated transaminase level, GERD (gastroesophageal reflux disease), History of kidney stones, Hyperlipidemia, Lower GI bleed, Sigmoid diverticulosis, Skin cancer, and Symptomatic anemia.    PAST SURGICAL HISTORY: Past Surgical History:  Procedure Laterality Date   APPENDECTOMY     ASPIRATION BIOPSY     of eye cyst   BACK SURGERY     BACK SURGERY     COLONOSCOPY N/A 01/28/2016   Procedure: COLONOSCOPY;  Surgeon: Ruffin Frederick, MD;  Location: [facility] ENDOSCOPY;  Service: Gastroenterology;  Laterality: N/A;   COLONOSCOPY  2015   ELBOW ARTHROSCOPY     ESOPHAGOGASTRODUODENOSCOPY N/A 01/28/2016   Procedure: ESOPHAGOGASTRODUODENOSCOPY (EGD);  Surgeon: Ruffin Frederick, MD;  Location: [facility] ENDOSCOPY;  Service: Gastroenterology;  Laterality: N/A;   GIVENS CAPSULE STUDY N/A 01/30/2016   Procedure: GIVENS CAPSULE STUDY;  Surgeon: Iva Boop, MD;  Location: [facility] ENDOSCOPY;  Service: Endoscopy;  Laterality: N/A;   MASS EXCISION Right 01/22/2018   Procedure: EXCISION OF 10 CM MASS ON RIGHT UPPER BACK ERAS PATHWAY;  Surgeon: Claud Kelp, MD;  Location: [facility] ORS;  Service: General;  Laterality:  Right;   SMALL BOWEL ENTEROSCOPY N/A 02/2016   double balloon    FAMILY HISTORY: family history includes Cancer in his maternal grandfather; Colon polyps in his father; Heart  disease in his father; Liver cancer in his father; Nephrolithiasis in his father; Prostate cancer in his father; Stomach cancer in his father.  SOCIAL HISTORY:  reports that he has never smoked. He has never used smokeless tobacco. He reports current alcohol use of about 10.0 standard drinks of alcohol per week. He reports that he does not use drugs.  ALLERGIES: Droperidol, Pravastatin, and Cefuroxime axetil  MEDICATIONS:  Current Outpatient Medications  Medication Sig Dispense Refill   Coenzyme Q10 (COQ10 PO) Take 1 tablet by mouth daily. (Patient not taking: Reported on 05/06/2023)     cyanocobalamin (VITAMIN B12) 1000 MCG tablet Take 1,000 mcg by mouth daily.     MEGARED OMEGA-3 KRILL OIL PO Take 1 capsule by mouth daily.  (Patient not taking: Reported on 05/06/2023)     Multiple Vitamin (MULTIVITAMIN WITH MINERALS) TABS tablet Take 1 tablet by mouth daily. (Patient not taking: Reported on 05/06/2023)     Probiotic Product (PROBIOTIC DAILY) CAPS Take 1 capsule by mouth daily. (Patient not taking: Reported on 05/06/2023)     PSYLLIUM HUSK PO Take 5-6 capsules by mouth 2 (two) times daily.      S-Adenosylmethionine (SAM-E) 400 MG TABS Take 800 mg by mouth daily. (Patient not taking: Reported on 05/06/2023)     TURMERIC PO Take 2 tablets by mouth daily.  (Patient not taking: Reported on 05/06/2023)     No current facility-administered medications for this encounter.    REVIEW OF SYSTEMS:  Notable for that above.   PHYSICAL EXAM:  vitals were not taken for this visit.   General: Alert and oriented, in no acute distress *** HEENT: Head is normocephalic. Extraocular movements are intact. Oropharynx is clear. Neck: Neck is supple, no palpable cervical or supraclavicular lymphadenopathy. Heart: Regular in rate and rhythm with no murmurs, rubs, or gallops. Chest: Clear to auscultation bilaterally, with no rhonchi, wheezes, or rales. Abdomen: Soft, nontender, nondistended, with no rigidity or  guarding. Extremities: No cyanosis or edema. Lymphatics: see Neck Exam Skin: No concerning lesions. Musculoskeletal: symmetric strength and muscle tone throughout. Neurologic: Cranial nerves II through XII are grossly intact. No obvious focalities. Speech is fluent. Coordination is intact. Psychiatric: Judgment and insight are intact. Affect is appropriate.   ECOG = ***  0 - Asymptomatic (Fully active, able to carry on all predisease activities without restriction)  1 - Symptomatic but completely ambulatory (Restricted in physically strenuous activity but ambulatory and able to carry out work of a light or sedentary nature. For example, light housework, office work)  2 - Symptomatic, <50% in bed during the day (Ambulatory and capable of all self care but unable to carry out any work activities. Up and about more than 50% of waking hours)  3 - Symptomatic, >50% in bed, but not bedbound (Capable of only limited self-care, confined to bed or chair 50% or more of waking hours)  4 - Bedbound (Completely disabled. Cannot carry on any self-care. Totally confined to bed or chair)  5 - Death   Santiago Glad MM, Creech RH, Tormey DC, et al. [phone]). "Toxicity and response criteria of the [facility]". Am. Evlyn Clines. Oncol. 5 (6): 649-55   LABORATORY DATA:  Lab Results  Component Value Date   WBC 7.0 05/06/2023   HGB 16.4 05/06/2023   HCT 47.1  05/06/2023   MCV 87.9 05/06/2023   PLT 249 05/06/2023   CMP     Component Value Date/Time   NA 139 05/06/2023 1500   K 3.9 05/06/2023 1500   CL 102 05/06/2023 1500   CO2 30 05/06/2023 1500   GLUCOSE 75 05/06/2023 1500   BUN 15 05/06/2023 1500   CREATININE 1.23 05/06/2023 1500   CALCIUM 9.6 05/06/2023 1500   PROT 7.8 05/06/2023 1500   ALBUMIN 4.4 05/06/2023 1500   AST 23 05/06/2023 1500   ALT 35 05/06/2023 1500   ALKPHOS 110 05/06/2023 1500   BILITOT 0.7 05/06/2023 1500   GFR 62.01 01/23/2023 0758   GFRNONAA >60 05/06/2023  1500         RADIOGRAPHY: NM PET Image Initial (PI) Whole Body  Result Date: 05/17/2023 CLINICAL DATA:  Initial treatment strategy for cutaneous T-cell lymphoma of the scalp. EXAM: NUCLEAR MEDICINE PET WHOLE BODY TECHNIQUE: 11.4 mCi F-18 FDG was injected intravenously. Full-ring PET imaging was performed from the head to foot after the radiotracer. CT data was obtained and used for attenuation correction and anatomic localization. Fasting blood glucose: 121 mg/dl COMPARISON:  None Available. FINDINGS: Mediastinal blood pool activity: SUV max 3.0 HEAD/NECK: 14 mm soft tissue lesion along the left anterior vertex (series 4/image 6) with focal hypermetabolism, max SUV 7.2. No hypermetabolic cervical lymphadenopathy. Incidental CT findings: none CHEST: No hypermetabolic mediastinal or hilar nodes. No suspicious pulmonary nodules on the CT scan. Incidental CT findings: none ABDOMEN/PELVIS: No abnormal hypermetabolic activity within the liver, pancreas, adrenal glands, or spleen. No hypermetabolic lymph nodes in the abdomen or pelvis. Incidental CT findings: Mild atherosclerotic calcifications of the abdominal aorta. SKELETON: No focal hypermetabolic activity to suggest skeletal metastasis. Incidental CT findings: Mild degenerative changes of the visualized thoracolumbar spine. EXTREMITIES: No abnormal hypermetabolic activity in the lower extremities. Incidental CT findings: none IMPRESSION: 14 mm soft tissue lesion along the left anterior vertex, compatible with known scalp lymphoma. No evidence of metastatic disease. Electronically Signed   By: Charline Bills M.D.   On: 05/17/2023 02:11      IMPRESSION/PLAN:***    On date of service, in total, I spent *** minutes on this encounter. Patient was seen in person.   __________________________________________   Lonie Peak, MD  This document serves as a record of services personally performed by Lonie Peak, MD. It was created on her behalf by Neena Rhymes, a trained medical scribe. The creation of this record is based on the scribe's personal observations and the provider's statements to them. This document has been checked and approved by the attending provider.

## 2023-05-19 ENCOUNTER — Encounter: Payer: Self-pay | Admitting: Radiation Oncology

## 2023-05-19 ENCOUNTER — Ambulatory Visit
Admission: RE | Admit: 2023-05-19 | Discharge: 2023-05-19 | Disposition: A | Discharge status: Home / Self Care | Payer: Commercial Managed Care - PPO | Source: Ambulatory Visit | Attending: Radiation Oncology | Admitting: Radiation Oncology

## 2023-05-19 ENCOUNTER — Ambulatory Visit
Admission: RE | Admit: 2023-05-19 | Discharge: 2023-05-19 | Disposition: A | Discharge status: Home / Self Care | Payer: Commercial Managed Care - PPO | Source: Ambulatory Visit | Attending: Radiation Oncology

## 2023-05-19 VITALS — BP 126/80 | HR 89 | Temp 97.5°F | Resp 18 | Ht 70.0 in | Wt 239.4 lb

## 2023-05-19 DIAGNOSIS — C8511 Unspecified B-cell lymphoma, lymph nodes of head, face, and neck: Secondary | ICD-10-CM | POA: Diagnosis not present

## 2023-05-19 DIAGNOSIS — Z87442 Personal history of urinary calculi: Secondary | ICD-10-CM | POA: Diagnosis not present

## 2023-05-19 DIAGNOSIS — Z800 Family history of malignant neoplasm of digestive organs: Secondary | ICD-10-CM | POA: Diagnosis not present

## 2023-05-19 DIAGNOSIS — I700 Atherosclerosis of aorta: Secondary | ICD-10-CM | POA: Insufficient documentation

## 2023-05-19 DIAGNOSIS — Z8042 Family history of malignant neoplasm of prostate: Secondary | ICD-10-CM | POA: Insufficient documentation

## 2023-05-19 DIAGNOSIS — D649 Anemia, unspecified: Secondary | ICD-10-CM | POA: Diagnosis not present

## 2023-05-19 DIAGNOSIS — Z860101 Personal history of adenomatous and serrated colon polyps: Secondary | ICD-10-CM | POA: Insufficient documentation

## 2023-05-19 DIAGNOSIS — E785 Hyperlipidemia, unspecified: Secondary | ICD-10-CM | POA: Diagnosis not present

## 2023-05-19 DIAGNOSIS — Z8719 Personal history of other diseases of the digestive system: Secondary | ICD-10-CM | POA: Diagnosis not present

## 2023-05-19 DIAGNOSIS — K219 Gastro-esophageal reflux disease without esophagitis: Secondary | ICD-10-CM | POA: Diagnosis not present

## 2023-05-19 NOTE — Progress Notes (Signed)
Oncology Nurse Navigator Documentation   Met with patient before initial consult with Dr. Basilio Cairo.    I introduced myself as his Navigator, explained my role as a member of the Care Team. I provided him with my direct contact information.  He verbalized understanding of information provided. I encouraged him to call with questions/concerns moving forward.  Hedda Slade, RN, BSN, OCN Head & Neck Oncology Nurse Navigator [facility] at [place] [phone]

## 2023-05-20 ENCOUNTER — Ambulatory Visit
Admission: RE | Admit: 2023-05-20 | Discharge: 2023-05-20 | Disposition: A | Discharge status: Home / Self Care | Payer: Commercial Managed Care - PPO | Source: Ambulatory Visit | Attending: Radiation Oncology | Admitting: Radiation Oncology

## 2023-05-20 DIAGNOSIS — C8511 Unspecified B-cell lymphoma, lymph nodes of head, face, and neck: Secondary | ICD-10-CM | POA: Diagnosis not present

## 2023-05-26 DIAGNOSIS — Z85828 Personal history of other malignant neoplasm of skin: Secondary | ICD-10-CM | POA: Insufficient documentation

## 2023-05-26 DIAGNOSIS — C8511 Unspecified B-cell lymphoma, lymph nodes of head, face, and neck: Secondary | ICD-10-CM | POA: Diagnosis not present

## 2023-05-28 ENCOUNTER — Other Ambulatory Visit: Payer: Self-pay

## 2023-05-28 ENCOUNTER — Inpatient Hospital Stay: Payer: Commercial Managed Care - PPO

## 2023-05-28 ENCOUNTER — Inpatient Hospital Stay: Payer: Commercial Managed Care - PPO | Attending: Oncology | Admitting: Oncology

## 2023-05-28 VITALS — BP 129/84 | HR 93 | Temp 98.1°F | Resp 18 | Ht 70.0 in | Wt 240.0 lb

## 2023-05-28 DIAGNOSIS — E785 Hyperlipidemia, unspecified: Secondary | ICD-10-CM | POA: Insufficient documentation

## 2023-05-28 DIAGNOSIS — C84A1 Cutaneous T-cell lymphoma, unspecified lymph nodes of head, face, and neck: Secondary | ICD-10-CM | POA: Diagnosis not present

## 2023-05-28 DIAGNOSIS — E538 Deficiency of other specified B group vitamins: Secondary | ICD-10-CM | POA: Insufficient documentation

## 2023-05-28 DIAGNOSIS — Z8719 Personal history of other diseases of the digestive system: Secondary | ICD-10-CM | POA: Insufficient documentation

## 2023-05-28 NOTE — Progress Notes (Signed)
   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  He feels well.  There is a persistent lesion at the scalp vertex.  There is a slightly raised area posterior to the dominant lesion.  No new complaint.  Objective:  Vital signs in last 24 hours:  Blood pressure 129/84, pulse 93, temperature 98.1 F (36.7 C), temperature source Temporal, resp. rate 18, height 5\' 10"  (1.778 m), weight 240 lb (108.9 kg), SpO2 98%.     Skin: 2.5 cm raised pink lesion at the scalp vertex.  Posterior to this there is a slightly raised white 1 cm area.  Round 5 mm raised papule at the left temporoparietal scalp superior to the left ear  Lab Results:  Lab Results  Component Value Date   WBC 7.0 05/06/2023   HGB 16.4 05/06/2023   HCT 47.1 05/06/2023   MCV 87.9 05/06/2023   PLT 249 05/06/2023   NEUTROABS 4.1 05/06/2023    CMP  Lab Results  Component Value Date   NA 139 05/06/2023   K 3.9 05/06/2023   CL 102 05/06/2023   CO2 30 05/06/2023   GLUCOSE 75 05/06/2023   BUN 15 05/06/2023   CREATININE 1.23 05/06/2023   CALCIUM 9.6 05/06/2023   PROT 7.8 05/06/2023   ALBUMIN 4.4 05/06/2023   AST 23 05/06/2023   ALT 35 05/06/2023   ALKPHOS 110 05/06/2023   BILITOT 0.7 05/06/2023   GFRNONAA >60 05/06/2023   GFRAA >60 01/09/2018    Imaging:  PET images reviewed with Dr. Melvyn Neth Medications: I have reviewed the patient's current medications.   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/02/2023 B-cell clonality positive Persistent raised 2-3 cm central parietal scalp lesion, separate 1 cm posterior parietal scalp lesion, and possible less than 1 cm left posterior parietal scalp lesion on exam 05/06/2023 PET 05/16/2023: 14 mm soft tissue lesion at the left anterior vertex, no evidence of metastatic disease Vitamin B12 deficiency GI bleed in  2017 Hyperlipidemia    Disposition: Dr. Melvyn Neth has been diagnosed with cutaneous B-cell lymphoma.  He has a lesion at the vertex of the scalp.  The lesion is hypermetabolic.  No other suspicious lesions on physical exam and no evidence of distant disease on PET.  Lab studies last month were unremarkable aside from mild elevation of the LDH.  This is likely a nonspecific finding.  He saw Dr. Basilio Cairo and is scheduled to begin radiation to the scalp lesion on 06/02/2023.  He will return for an office visit in 3 months.  We will repeat the LDH when he is here in 3 months.  I reviewed the PET findings and images with him.  Thornton Papas, MD  05/28/2023  10:37 AM

## 2023-06-02 ENCOUNTER — Ambulatory Visit
Admission: RE | Admit: 2023-06-02 | Discharge: 2023-06-02 | Disposition: A | Discharge status: Home / Self Care | Payer: Commercial Managed Care - PPO | Source: Ambulatory Visit | Attending: Radiation Oncology | Admitting: Radiation Oncology

## 2023-06-02 ENCOUNTER — Ambulatory Visit
Admission: RE | Admit: 2023-06-02 | Payer: Commercial Managed Care - PPO | Source: Ambulatory Visit | Admitting: Radiation Oncology

## 2023-06-02 ENCOUNTER — Ambulatory Visit: Payer: Commercial Managed Care - PPO

## 2023-06-02 ENCOUNTER — Other Ambulatory Visit: Payer: Self-pay

## 2023-06-02 DIAGNOSIS — Z85828 Personal history of other malignant neoplasm of skin: Secondary | ICD-10-CM | POA: Diagnosis not present

## 2023-06-02 DIAGNOSIS — C8511 Unspecified B-cell lymphoma, lymph nodes of head, face, and neck: Secondary | ICD-10-CM | POA: Diagnosis not present

## 2023-06-02 LAB — RAD ONC ARIA SESSION SUMMARY
Course Elapsed Days: 0
Plan Fractions Treated to Date: 1
Plan Prescribed Dose Per Fraction: 1.8 Gy
Plan Total Fractions Prescribed: 17
Plan Total Prescribed Dose: 30.6 Gy
Reference Point Dosage Given to Date: 1.8 Gy
Reference Point Session Dosage Given: 1.8 Gy
Session Number: 1

## 2023-06-03 ENCOUNTER — Ambulatory Visit
Admission: RE | Admit: 2023-06-03 | Discharge: 2023-06-03 | Disposition: A | Discharge status: Home / Self Care | Payer: Commercial Managed Care - PPO | Source: Ambulatory Visit | Attending: Radiation Oncology | Admitting: Radiation Oncology

## 2023-06-03 ENCOUNTER — Other Ambulatory Visit: Payer: Self-pay

## 2023-06-03 DIAGNOSIS — Z85828 Personal history of other malignant neoplasm of skin: Secondary | ICD-10-CM | POA: Diagnosis not present

## 2023-06-03 LAB — RAD ONC ARIA SESSION SUMMARY
Course Elapsed Days: 1
Plan Fractions Treated to Date: 2
Plan Prescribed Dose Per Fraction: 1.8 Gy
Plan Total Fractions Prescribed: 17
Plan Total Prescribed Dose: 30.6 Gy
Reference Point Dosage Given to Date: 3.6 Gy
Reference Point Session Dosage Given: 1.8 Gy
Session Number: 2

## 2023-06-04 ENCOUNTER — Other Ambulatory Visit: Payer: Self-pay

## 2023-06-04 ENCOUNTER — Ambulatory Visit
Admission: RE | Admit: 2023-06-04 | Discharge: 2023-06-04 | Disposition: A | Discharge status: Home / Self Care | Payer: Commercial Managed Care - PPO | Source: Ambulatory Visit | Attending: Radiation Oncology | Admitting: Radiation Oncology

## 2023-06-04 DIAGNOSIS — Z85828 Personal history of other malignant neoplasm of skin: Secondary | ICD-10-CM | POA: Diagnosis not present

## 2023-06-04 LAB — RAD ONC ARIA SESSION SUMMARY
Course Elapsed Days: 2
Plan Fractions Treated to Date: 3
Plan Prescribed Dose Per Fraction: 1.8 Gy
Plan Total Fractions Prescribed: 17
Plan Total Prescribed Dose: 30.6 Gy
Reference Point Dosage Given to Date: 5.4 Gy
Reference Point Session Dosage Given: 1.8 Gy
Session Number: 3

## 2023-06-05 ENCOUNTER — Ambulatory Visit
Admission: RE | Admit: 2023-06-05 | Discharge: 2023-06-05 | Disposition: A | Discharge status: Home / Self Care | Payer: Commercial Managed Care - PPO | Source: Ambulatory Visit | Attending: Radiation Oncology | Admitting: Radiation Oncology

## 2023-06-05 ENCOUNTER — Other Ambulatory Visit: Payer: Self-pay

## 2023-06-05 DIAGNOSIS — Z85828 Personal history of other malignant neoplasm of skin: Secondary | ICD-10-CM | POA: Diagnosis not present

## 2023-06-05 LAB — RAD ONC ARIA SESSION SUMMARY
Course Elapsed Days: 3
Plan Fractions Treated to Date: 4
Plan Prescribed Dose Per Fraction: 1.8 Gy
Plan Total Fractions Prescribed: 17
Plan Total Prescribed Dose: 30.6 Gy
Reference Point Dosage Given to Date: 7.2 Gy
Reference Point Session Dosage Given: 1.8 Gy
Session Number: 4

## 2023-06-06 ENCOUNTER — Other Ambulatory Visit: Payer: Self-pay

## 2023-06-06 ENCOUNTER — Ambulatory Visit
Admission: RE | Admit: 2023-06-06 | Discharge: 2023-06-06 | Disposition: A | Discharge status: Home / Self Care | Payer: Commercial Managed Care - PPO | Source: Ambulatory Visit | Attending: Radiation Oncology

## 2023-06-06 DIAGNOSIS — C8511 Unspecified B-cell lymphoma, lymph nodes of head, face, and neck: Secondary | ICD-10-CM | POA: Diagnosis not present

## 2023-06-06 DIAGNOSIS — Z85828 Personal history of other malignant neoplasm of skin: Secondary | ICD-10-CM | POA: Diagnosis not present

## 2023-06-06 DIAGNOSIS — Z510 Encounter for antineoplastic radiation therapy: Secondary | ICD-10-CM | POA: Diagnosis not present

## 2023-06-06 LAB — RAD ONC ARIA SESSION SUMMARY
Course Elapsed Days: 4
Plan Fractions Treated to Date: 5
Plan Prescribed Dose Per Fraction: 1.8 Gy
Plan Total Fractions Prescribed: 17
Plan Total Prescribed Dose: 30.6 Gy
Reference Point Dosage Given to Date: 9 Gy
Reference Point Session Dosage Given: 1.8 Gy
Session Number: 5

## 2023-06-09 ENCOUNTER — Ambulatory Visit
Admission: RE | Admit: 2023-06-09 | Discharge: 2023-06-09 | Disposition: A | Discharge status: Home / Self Care | Payer: Commercial Managed Care - PPO | Source: Ambulatory Visit | Attending: Radiation Oncology | Admitting: Radiation Oncology

## 2023-06-09 ENCOUNTER — Other Ambulatory Visit: Payer: Self-pay

## 2023-06-09 DIAGNOSIS — Z85828 Personal history of other malignant neoplasm of skin: Secondary | ICD-10-CM | POA: Diagnosis not present

## 2023-06-09 LAB — RAD ONC ARIA SESSION SUMMARY
Course Elapsed Days: 7
Plan Fractions Treated to Date: 6
Plan Prescribed Dose Per Fraction: 1.8 Gy
Plan Total Fractions Prescribed: 17
Plan Total Prescribed Dose: 30.6 Gy
Reference Point Dosage Given to Date: 10.8 Gy
Reference Point Session Dosage Given: 1.8 Gy
Session Number: 6

## 2023-06-09 MED ORDER — SONAFINE EX EMUL
1.0000 | Freq: Two times a day (BID) | CUTANEOUS | Status: DC
  Administered 2023-06-09: 1 via TOPICAL

## 2023-06-10 ENCOUNTER — Other Ambulatory Visit: Payer: Self-pay

## 2023-06-10 ENCOUNTER — Ambulatory Visit
Admission: RE | Admit: 2023-06-10 | Discharge: 2023-06-10 | Disposition: A | Discharge status: Home / Self Care | Payer: Commercial Managed Care - PPO | Source: Ambulatory Visit | Attending: Radiation Oncology

## 2023-06-10 DIAGNOSIS — Z85828 Personal history of other malignant neoplasm of skin: Secondary | ICD-10-CM | POA: Diagnosis not present

## 2023-06-10 LAB — RAD ONC ARIA SESSION SUMMARY
Course Elapsed Days: 8
Plan Fractions Treated to Date: 7
Plan Prescribed Dose Per Fraction: 1.8 Gy
Plan Total Fractions Prescribed: 17
Plan Total Prescribed Dose: 30.6 Gy
Reference Point Dosage Given to Date: 12.6 Gy
Reference Point Session Dosage Given: 1.8 Gy
Session Number: 7

## 2023-06-11 ENCOUNTER — Other Ambulatory Visit: Payer: Self-pay

## 2023-06-11 ENCOUNTER — Ambulatory Visit
Admission: RE | Admit: 2023-06-11 | Discharge: 2023-06-11 | Disposition: A | Discharge status: Home / Self Care | Payer: Commercial Managed Care - PPO | Source: Ambulatory Visit | Attending: Radiation Oncology | Admitting: Radiation Oncology

## 2023-06-11 DIAGNOSIS — Z85828 Personal history of other malignant neoplasm of skin: Secondary | ICD-10-CM | POA: Diagnosis not present

## 2023-06-11 LAB — RAD ONC ARIA SESSION SUMMARY
Course Elapsed Days: 9
Plan Fractions Treated to Date: 8
Plan Prescribed Dose Per Fraction: 1.8 Gy
Plan Total Fractions Prescribed: 17
Plan Total Prescribed Dose: 30.6 Gy
Reference Point Dosage Given to Date: 14.4 Gy
Reference Point Session Dosage Given: 1.8 Gy
Session Number: 8

## 2023-06-12 ENCOUNTER — Other Ambulatory Visit: Payer: Self-pay

## 2023-06-12 ENCOUNTER — Ambulatory Visit
Admission: RE | Admit: 2023-06-12 | Discharge: 2023-06-12 | Disposition: A | Discharge status: Home / Self Care | Payer: Commercial Managed Care - PPO | Source: Ambulatory Visit | Attending: Radiation Oncology | Admitting: Radiation Oncology

## 2023-06-12 DIAGNOSIS — Z85828 Personal history of other malignant neoplasm of skin: Secondary | ICD-10-CM | POA: Diagnosis not present

## 2023-06-12 LAB — RAD ONC ARIA SESSION SUMMARY
Course Elapsed Days: 10
Plan Fractions Treated to Date: 9
Plan Prescribed Dose Per Fraction: 1.8 Gy
Plan Total Fractions Prescribed: 17
Plan Total Prescribed Dose: 30.6 Gy
Reference Point Dosage Given to Date: 16.2 Gy
Reference Point Session Dosage Given: 1.8 Gy
Session Number: 9

## 2023-06-13 ENCOUNTER — Ambulatory Visit
Admission: RE | Admit: 2023-06-13 | Discharge: 2023-06-13 | Disposition: A | Discharge status: Home / Self Care | Payer: Commercial Managed Care - PPO | Source: Ambulatory Visit | Attending: Radiation Oncology | Admitting: Radiation Oncology

## 2023-06-13 ENCOUNTER — Other Ambulatory Visit: Payer: Self-pay

## 2023-06-13 DIAGNOSIS — Z85828 Personal history of other malignant neoplasm of skin: Secondary | ICD-10-CM | POA: Diagnosis not present

## 2023-06-13 DIAGNOSIS — C8511 Unspecified B-cell lymphoma, lymph nodes of head, face, and neck: Secondary | ICD-10-CM | POA: Diagnosis not present

## 2023-06-13 DIAGNOSIS — Z510 Encounter for antineoplastic radiation therapy: Secondary | ICD-10-CM | POA: Diagnosis not present

## 2023-06-13 LAB — RAD ONC ARIA SESSION SUMMARY
Course Elapsed Days: 11
Plan Fractions Treated to Date: 10
Plan Prescribed Dose Per Fraction: 1.8 Gy
Plan Total Fractions Prescribed: 17
Plan Total Prescribed Dose: 30.6 Gy
Reference Point Dosage Given to Date: 18 Gy
Reference Point Session Dosage Given: 1.8 Gy
Session Number: 10

## 2023-06-16 ENCOUNTER — Other Ambulatory Visit: Payer: Self-pay

## 2023-06-16 ENCOUNTER — Ambulatory Visit: Payer: Commercial Managed Care - PPO

## 2023-06-16 ENCOUNTER — Ambulatory Visit
Admission: RE | Admit: 2023-06-16 | Discharge: 2023-06-16 | Discharge status: Home / Self Care | Payer: Commercial Managed Care - PPO | Source: Ambulatory Visit | Attending: Radiation Oncology

## 2023-06-16 ENCOUNTER — Ambulatory Visit
Admission: RE | Admit: 2023-06-16 | Discharge: 2023-06-16 | Disposition: A | Discharge status: Home / Self Care | Payer: Commercial Managed Care - PPO | Source: Ambulatory Visit | Attending: Radiation Oncology | Admitting: Radiation Oncology

## 2023-06-16 DIAGNOSIS — Z85828 Personal history of other malignant neoplasm of skin: Secondary | ICD-10-CM | POA: Diagnosis not present

## 2023-06-16 LAB — RAD ONC ARIA SESSION SUMMARY
Course Elapsed Days: 14
Plan Fractions Treated to Date: 11
Plan Prescribed Dose Per Fraction: 1.8 Gy
Plan Total Fractions Prescribed: 17
Plan Total Prescribed Dose: 30.6 Gy
Reference Point Dosage Given to Date: 19.8 Gy
Reference Point Session Dosage Given: 1.8 Gy
Session Number: 11

## 2023-06-17 ENCOUNTER — Ambulatory Visit
Admission: RE | Admit: 2023-06-17 | Discharge: 2023-06-17 | Disposition: A | Discharge status: Home / Self Care | Payer: Commercial Managed Care - PPO | Source: Ambulatory Visit | Attending: Radiation Oncology

## 2023-06-17 ENCOUNTER — Other Ambulatory Visit: Payer: Self-pay

## 2023-06-17 DIAGNOSIS — Z85828 Personal history of other malignant neoplasm of skin: Secondary | ICD-10-CM | POA: Diagnosis not present

## 2023-06-17 LAB — RAD ONC ARIA SESSION SUMMARY
Course Elapsed Days: 15
Plan Fractions Treated to Date: 12
Plan Prescribed Dose Per Fraction: 1.8 Gy
Plan Total Fractions Prescribed: 17
Plan Total Prescribed Dose: 30.6 Gy
Reference Point Dosage Given to Date: 21.6 Gy
Reference Point Session Dosage Given: 1.8 Gy
Session Number: 12

## 2023-06-19 ENCOUNTER — Ambulatory Visit
Admission: RE | Admit: 2023-06-19 | Discharge: 2023-06-19 | Disposition: A | Discharge status: Home / Self Care | Payer: Commercial Managed Care - PPO | Source: Ambulatory Visit | Attending: Radiation Oncology | Admitting: Radiation Oncology

## 2023-06-19 ENCOUNTER — Other Ambulatory Visit: Payer: Self-pay

## 2023-06-19 ENCOUNTER — Ambulatory Visit: Payer: Commercial Managed Care - PPO

## 2023-06-19 DIAGNOSIS — Z85828 Personal history of other malignant neoplasm of skin: Secondary | ICD-10-CM | POA: Diagnosis not present

## 2023-06-19 DIAGNOSIS — E538 Deficiency of other specified B group vitamins: Secondary | ICD-10-CM | POA: Diagnosis not present

## 2023-06-19 LAB — RAD ONC ARIA SESSION SUMMARY
Course Elapsed Days: 17
Plan Fractions Treated to Date: 13
Plan Prescribed Dose Per Fraction: 1.8 Gy
Plan Total Fractions Prescribed: 17
Plan Total Prescribed Dose: 30.6 Gy
Reference Point Dosage Given to Date: 23.4 Gy
Reference Point Session Dosage Given: 1.8 Gy
Session Number: 13

## 2023-06-19 MED ORDER — CYANOCOBALAMIN 1000 MCG/ML IJ SOLN
1000.0000 ug | Freq: Once | INTRAMUSCULAR | Status: AC
  Administered 2023-06-19: 1000 ug via INTRAMUSCULAR

## 2023-06-19 NOTE — Progress Notes (Signed)
 PT visits today for their b-12 injection. PT was informed of what they were receiving and tolerated injection well. PT notified to reach out to office if needed.

## 2023-06-20 ENCOUNTER — Other Ambulatory Visit: Payer: Self-pay

## 2023-06-20 ENCOUNTER — Ambulatory Visit
Admission: RE | Admit: 2023-06-20 | Discharge: 2023-06-20 | Disposition: A | Discharge status: Home / Self Care | Payer: Commercial Managed Care - PPO | Source: Ambulatory Visit | Attending: Radiation Oncology

## 2023-06-20 DIAGNOSIS — Z85828 Personal history of other malignant neoplasm of skin: Secondary | ICD-10-CM | POA: Diagnosis not present

## 2023-06-20 LAB — RAD ONC ARIA SESSION SUMMARY
Course Elapsed Days: 18
Plan Fractions Treated to Date: 14
Plan Prescribed Dose Per Fraction: 1.8 Gy
Plan Total Fractions Prescribed: 17
Plan Total Prescribed Dose: 30.6 Gy
Reference Point Dosage Given to Date: 25.2 Gy
Reference Point Session Dosage Given: 1.8 Gy
Session Number: 14

## 2023-06-23 ENCOUNTER — Other Ambulatory Visit: Payer: Self-pay

## 2023-06-23 ENCOUNTER — Ambulatory Visit
Admission: RE | Admit: 2023-06-23 | Discharge: 2023-06-23 | Disposition: A | Discharge status: Home / Self Care | Payer: Commercial Managed Care - PPO | Source: Ambulatory Visit | Attending: Radiation Oncology | Admitting: Radiation Oncology

## 2023-06-23 DIAGNOSIS — Z510 Encounter for antineoplastic radiation therapy: Secondary | ICD-10-CM | POA: Diagnosis not present

## 2023-06-23 DIAGNOSIS — C8511 Unspecified B-cell lymphoma, lymph nodes of head, face, and neck: Secondary | ICD-10-CM | POA: Diagnosis not present

## 2023-06-23 DIAGNOSIS — Z85828 Personal history of other malignant neoplasm of skin: Secondary | ICD-10-CM | POA: Diagnosis not present

## 2023-06-23 LAB — RAD ONC ARIA SESSION SUMMARY
Course Elapsed Days: 21
Plan Fractions Treated to Date: 15
Plan Prescribed Dose Per Fraction: 1.8 Gy
Plan Total Fractions Prescribed: 17
Plan Total Prescribed Dose: 30.6 Gy
Reference Point Dosage Given to Date: 27 Gy
Reference Point Session Dosage Given: 1.8 Gy
Session Number: 15

## 2023-06-24 ENCOUNTER — Other Ambulatory Visit: Payer: Self-pay

## 2023-06-24 ENCOUNTER — Ambulatory Visit
Admission: RE | Admit: 2023-06-24 | Discharge: 2023-06-24 | Disposition: A | Discharge status: Home / Self Care | Payer: Commercial Managed Care - PPO | Source: Ambulatory Visit | Attending: Radiation Oncology | Admitting: Radiation Oncology

## 2023-06-24 DIAGNOSIS — Z85828 Personal history of other malignant neoplasm of skin: Secondary | ICD-10-CM | POA: Diagnosis not present

## 2023-06-24 LAB — RAD ONC ARIA SESSION SUMMARY
Course Elapsed Days: 22
Plan Fractions Treated to Date: 16
Plan Prescribed Dose Per Fraction: 1.8 Gy
Plan Total Fractions Prescribed: 17
Plan Total Prescribed Dose: 30.6 Gy
Reference Point Dosage Given to Date: 28.8 Gy
Reference Point Session Dosage Given: 1.8 Gy
Session Number: 16

## 2023-06-26 ENCOUNTER — Ambulatory Visit
Admission: RE | Admit: 2023-06-26 | Discharge: 2023-06-26 | Disposition: A | Discharge status: Home / Self Care | Payer: Commercial Managed Care - PPO | Source: Ambulatory Visit | Attending: Radiation Oncology | Admitting: Radiation Oncology

## 2023-06-26 ENCOUNTER — Other Ambulatory Visit: Payer: Self-pay

## 2023-06-26 DIAGNOSIS — Z85828 Personal history of other malignant neoplasm of skin: Secondary | ICD-10-CM | POA: Diagnosis not present

## 2023-06-26 LAB — RAD ONC ARIA SESSION SUMMARY
Course Elapsed Days: 24
Plan Fractions Treated to Date: 17
Plan Prescribed Dose Per Fraction: 1.8 Gy
Plan Total Fractions Prescribed: 17
Plan Total Prescribed Dose: 30.6 Gy
Reference Point Dosage Given to Date: 30.6 Gy
Reference Point Session Dosage Given: 1.8 Gy
Session Number: 17

## 2023-06-27 NOTE — Radiation Completion Notes (Signed)
 Patient Name: Kent Montgomery, Kent Montgomery MRN: [id number] Date of Birth: Oct 04, 1955 Referring Physician: ARLEY HOF, M.D. Date of Service: 2023-06-27 Radiation Oncologist: Lauraine Golden, M.D. [facility]                             RADIATION ONCOLOGY END OF TREATMENT NOTE     Diagnosis: C85.11 Unspecified B-cell lymphoma, lymph nodes of head, face, and neck Intent: Curative     ==========DELIVERED PLANS==========  First Treatment Date: 2023-06-02 Last Treatment Date: 2023-06-26   Plan Name: HN_MidSclp_BO Site: Scalp Technique: Electron Mode: Electron Dose Per Fraction: 1.8 Gy Prescribed Dose (Delivered / Prescribed): 30.6 Gy / 30.6 Gy Prescribed Fxs (Delivered / Prescribed): 17 / 17     ==========ON TREATMENT VISIT DATES========== 2023-06-02, 2023-06-09, 2023-06-16, 2023-06-24     ==========UPCOMING VISITS==========       ==========APPENDIX - ON TREATMENT VISIT NOTES==========   See weekly On Treatment Notes in Epic for details in the Media tab (listed as Progress notes on the On Treatment Visit Dates listed above).

## 2023-07-17 ENCOUNTER — Ambulatory Visit: Payer: Commercial Managed Care - PPO

## 2023-07-17 DIAGNOSIS — E538 Deficiency of other specified B group vitamins: Secondary | ICD-10-CM

## 2023-07-17 MED ORDER — CYANOCOBALAMIN 1000 MCG/ML IJ SOLN
1000.0000 ug | Freq: Once | INTRAMUSCULAR | Status: AC
  Administered 2023-07-17: 1000 ug via INTRAMUSCULAR

## 2023-07-17 NOTE — Progress Notes (Signed)
After obtaining consent, and per orders of Dr. Lawerance Bach, injection of B12 given by Ferdie Ping. Patient instructed to report any adverse reaction to me immediately.

## 2023-07-28 NOTE — Progress Notes (Addendum)
   Kent Montgomery is here for a six week follow-up appointment today with Kent Due PA-C  He completed his treatment on 06-26-2023 Recurrent fevers, or drenching night sweats, if any: Denies Pain issues, if any: Denies Using a feeding tube?: Denies Weight changes, if any over the past 6 months:: Denies Swallowing issues, if any: Denies Smoking or chewing tobacco? Denies Using fluoride toothpaste daily? Yes, he reports using twice daily. Last ENT visit was on: He states that it has been years ago.  BP 135/82 (BP Location: Left Arm, Patient Position: Sitting, Cuff Size: Large)   Temp 97.8 F (36.6 C)   Resp 20   Ht 5' 10 (1.778 m)   Wt 249 lb 9.6 oz (113.2 kg)   SpO2 97%   BMI 35.81 kg/m

## 2023-07-29 ENCOUNTER — Ambulatory Visit
Admission: RE | Admit: 2023-07-29 | Discharge: 2023-07-29 | Disposition: A | Discharge status: Home / Self Care | Payer: Commercial Managed Care - PPO | Source: Ambulatory Visit | Attending: Radiology | Admitting: Radiology

## 2023-07-29 ENCOUNTER — Encounter: Payer: Self-pay | Admitting: Radiology

## 2023-07-29 VITALS — BP 135/82 | Temp 97.8°F | Resp 20 | Ht 70.0 in | Wt 249.6 lb

## 2023-07-29 DIAGNOSIS — Z85828 Personal history of other malignant neoplasm of skin: Secondary | ICD-10-CM

## 2023-07-29 NOTE — Progress Notes (Signed)
 Radiation Oncology         [phone]) [phone] ________________________________  Name: Nikolai Wilczak MRN: [id number]  Date: 07/29/2023  DOB: 25-Apr-1956  Follow-Up Visit Note  CC: Geofm Glade PARAS, MD  Cloretta Arley NOVAK, MD  Diagnosis and Prior Radiotherapy:       ICD-10-CM   1. History of basal cell carcinoma (BCC) of skin  Z85.828       ==========DELIVERED PLANS==========  First Treatment Date: 2023-06-02 Last Treatment Date: 2023-06-26   Plan Name: HN_MidSclp_BO Site: Scalp Technique: Electron Mode: Electron Dose Per Fraction: 1.8 Gy Prescribed Dose (Delivered / Prescribed): 30.6 Gy / 30.6 Gy Prescribed Fxs (Delivered / Prescribed): 17 / 17  CHIEF COMPLAINT:  Here for follow-up and surveillance of B-cell lymphoma  Narrative:  The patient returns today for routine follow-up. He completed his treatment on 01/02/205.  Patient is doing well overall today. He denies any residual skin irritation, fatigue, or other side effects from the radiation treatment. He is eager for his hair to grow back.   ALLERGIES:  is allergic to droperidol, pravastatin, and cefuroxime axetil.  Meds: Current Outpatient Medications  Medication Sig Dispense Refill   Coenzyme Q10 (COQ10 PO) Take 1 tablet by mouth daily.     cyanocobalamin  (VITAMIN B12) 1000 MCG tablet Take 1,000 mcg by mouth daily.     Cyanocobalamin  1000 MCG/ML KIT Inject 1,000 mcg as directed every 30 (thirty) days.     MEGARED OMEGA-3 KRILL OIL PO Take 1 capsule by mouth daily.     Multiple Vitamin (MULTIVITAMIN WITH MINERALS) TABS tablet Take 1 tablet by mouth daily.     Probiotic Product (PROBIOTIC DAILY) CAPS Take 1 capsule by mouth daily.     PSYLLIUM HUSK PO Take 5-6 capsules by mouth 2 (two) times daily.      TURMERIC PO Take 2 tablets by mouth daily.     No current facility-administered medications for this encounter.    Physical Findings: The patient is in no acute distress. Patient is alert and oriented. Wt Readings from  Last 3 Encounters:  07/29/23 113.2 kg  05/28/23 108.9 kg  05/19/23 108.6 kg    height is 5' 10 (1.778 m) and weight is 113.2 kg. His temperature is 97.8 F (36.6 C). His blood pressure is 135/82. His respiration is 20 and oxygen saturation is 97%. .  General: Alert and oriented, in no acute distress HEENT: Head is normocephalic. Extraocular movements are intact.  Neck: Neck is notable for no palpable cervical, supraclavicular, or axillary adenopathy.  Skin: Skin in treatment fields shows satisfactory healing. See picture below.  Heart: Regular in rate and rhythm with no murmurs, rubs, or gallops. Chest: Clear to auscultation bilaterally, with no rhonchi, wheezes, or rales. Extremities: No cyanosis or edema. Lymphatics: see Neck Exam Psychiatric: Judgment and insight are intact. Affect is appropriate.      Lab Findings: Lab Results  Component Value Date   WBC 7.0 05/06/2023   HGB 16.4 05/06/2023   HCT 47.1 05/06/2023   MCV 87.9 05/06/2023   PLT 249 05/06/2023    Lab Results  Component Value Date   TSH 0.99 01/23/2023    Radiographic Findings: No results found.  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  Patient is doing well overall and continues to heal well from the effects of radiation therapy. He will follow-up under the care of Dr. Cloretta and is scheduled to see him next on 09/11/2023.  Patient will continue to see his dermatologist, Dr. Joshua, regularly for skin checks. Radiation follow-up PRN. We appreciate the opportunity to take part in this patient's care.   On date of service, in total, I spent 20 minutes on this encounter. Patient was seen in person. _____________________________________    Leeroy Due, PA-C   Lauraine Golden, MD    [facility]  Radiation Oncology Direct Dial: [phone]  Fax: [phone] [email or web address]

## 2023-08-22 ENCOUNTER — Other Ambulatory Visit (HOSPITAL_COMMUNITY): Payer: Self-pay

## 2023-08-22 DIAGNOSIS — H0015 Chalazion left lower eyelid: Secondary | ICD-10-CM | POA: Diagnosis not present

## 2023-08-22 MED ORDER — DOXYCYCLINE HYCLATE 100 MG PO TABS
100.0000 mg | ORAL_TABLET | Freq: Two times a day (BID) | ORAL | 0 refills | Status: DC
  Filled 2023-08-22: qty 20, 10d supply, fill #0

## 2023-08-22 MED ORDER — ERYTHROMYCIN 5 MG/GM OP OINT
TOPICAL_OINTMENT | Freq: Every day | OPHTHALMIC | 3 refills | Status: DC
  Filled 2023-08-22: qty 3.5, 5d supply, fill #0

## 2023-08-29 DIAGNOSIS — H0015 Chalazion left lower eyelid: Secondary | ICD-10-CM | POA: Diagnosis not present

## 2023-09-09 ENCOUNTER — Other Ambulatory Visit: Payer: Commercial Managed Care - PPO

## 2023-09-09 ENCOUNTER — Ambulatory Visit: Payer: Commercial Managed Care - PPO | Admitting: Oncology

## 2023-09-11 ENCOUNTER — Inpatient Hospital Stay: Payer: Commercial Managed Care - PPO | Admitting: Oncology

## 2023-09-11 ENCOUNTER — Inpatient Hospital Stay: Payer: Commercial Managed Care - PPO | Attending: Oncology

## 2023-09-11 VITALS — BP 135/87 | HR 87 | Temp 98.1°F | Resp 18 | Ht 70.0 in | Wt 239.2 lb

## 2023-09-11 DIAGNOSIS — Z8719 Personal history of other diseases of the digestive system: Secondary | ICD-10-CM | POA: Insufficient documentation

## 2023-09-11 DIAGNOSIS — E538 Deficiency of other specified B group vitamins: Secondary | ICD-10-CM | POA: Insufficient documentation

## 2023-09-11 DIAGNOSIS — E785 Hyperlipidemia, unspecified: Secondary | ICD-10-CM | POA: Diagnosis not present

## 2023-09-11 DIAGNOSIS — C84A1 Cutaneous T-cell lymphoma, unspecified lymph nodes of head, face, and neck: Secondary | ICD-10-CM

## 2023-09-11 DIAGNOSIS — C859A Non-Hodgkin lymphoma, unspecified, in remission: Secondary | ICD-10-CM | POA: Diagnosis not present

## 2023-09-11 DIAGNOSIS — Z923 Personal history of irradiation: Secondary | ICD-10-CM | POA: Diagnosis not present

## 2023-09-11 LAB — CBC WITH DIFFERENTIAL (CANCER CENTER ONLY)
Abs Immature Granulocytes: 0.01 10*3/uL (ref 0.00–0.07)
Basophils Absolute: 0 10*3/uL (ref 0.0–0.1)
Basophils Relative: 1 %
Eosinophils Absolute: 0.2 10*3/uL (ref 0.0–0.5)
Eosinophils Relative: 3 %
HCT: 46.1 % (ref 39.0–52.0)
Hemoglobin: 15.1 g/dL (ref 13.0–17.0)
Immature Granulocytes: 0 %
Lymphocytes Relative: 25 %
Lymphs Abs: 1.3 10*3/uL (ref 0.7–4.0)
MCH: 26.7 pg (ref 26.0–34.0)
MCHC: 32.8 g/dL (ref 30.0–36.0)
MCV: 81.4 fL (ref 80.0–100.0)
Monocytes Absolute: 0.7 10*3/uL (ref 0.1–1.0)
Monocytes Relative: 13 %
Neutro Abs: 3.1 10*3/uL (ref 1.7–7.7)
Neutrophils Relative %: 58 %
Platelet Count: 202 10*3/uL (ref 150–400)
RBC: 5.66 MIL/uL (ref 4.22–5.81)
RDW: 14.5 % (ref 11.5–15.5)
WBC Count: 5.3 10*3/uL (ref 4.0–10.5)
nRBC: 0 % (ref 0.0–0.2)

## 2023-09-11 LAB — LACTATE DEHYDROGENASE: LDH: 174 U/L (ref 98–192)

## 2023-09-11 NOTE — Progress Notes (Signed)
  Kent Montgomery OFFICE PROGRESS NOTE   Diagnosis: Cutaneous lymphoma  INTERVAL HISTORY:   Kent Montgomery returns as scheduled.  Completed radiation to the scalp in January.  No palpable scalp nodule.  No fever, night sweats, or other palpable skin lesions.  He had a "stye "drained at the left eyelid recently.  He is scheduled to see ophthalmology for follow-up.  Objective:  Vital signs in last 24 hours:  Blood pressure 135/87, pulse 87, temperature 98.1 F (36.7 C), temperature source Temporal, resp. rate 18, height 5\' 10"  (1.778 m), weight 239 lb 3.2 oz (108.5 kg), SpO2 100%.    HEENT: 2-3 mm area of erythema and mild induration at the left lower eyelid Lymphatics: No cervical, supraclavicular, axillary, or inguinal nodes Resp: Lungs clear bilaterally Cardio: Regular rate and rhythm GI: No hepatosplenomegaly, no mass Vascular: No leg edema  Skin: No cutaneous nodule at the parietal scalp, hypopigmented 1-2 cm area posterior to the scalp vertex without nodularity  Lab Results:  Lab Results  Component Value Date   WBC 5.3 09/11/2023   HGB 15.1 09/11/2023   HCT 46.1 09/11/2023   MCV 81.4 09/11/2023   PLT 202 09/11/2023   NEUTROABS 3.1 09/11/2023    CMP  Lab Results  Component Value Date   NA 139 05/06/2023   K 3.9 05/06/2023   CL 102 05/06/2023   CO2 30 05/06/2023   GLUCOSE 75 05/06/2023   BUN 15 05/06/2023   CREATININE 1.23 05/06/2023   CALCIUM 9.6 05/06/2023   PROT 7.8 05/06/2023   ALBUMIN 4.4 05/06/2023   AST 23 05/06/2023   ALT 35 05/06/2023   ALKPHOS 110 05/06/2023   BILITOT 0.7 05/06/2023   GFRNONAA >60 05/06/2023   GFRAA >60 01/09/2018   Medications: I have reviewed the patient's current medications.   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/02/2023 B-cell clonality positive Persistent raised 2-3 cm central parietal scalp lesion, separate 1 cm posterior parietal scalp lesion, and  possible less than 1 cm left posterior parietal scalp lesion on exam 05/06/2023 PET 05/16/2023: 14 mm soft tissue lesion at the left anterior vertex, no evidence of metastatic disease Radiation to the scalp, 30.6 Gray in 17 fractions 06/02/2023 - 06/26/2023 Vitamin B12 deficiency GI bleed in 2017 Hyperlipidemia     Disposition: Kent Montgomery completed radiation to the scalp vertex for treatment of a cutaneous B-cell lymphoma.  There is no evidence of residual lymphoma at the scalp.  He is in clinical remission.  I suspect the left eyelid lesion is unrelated to lymphoma.  He is scheduled to follow-up with ophthalmology.  He will call if this area enlarges.  He is scheduled to see Kent Montgomery next month.  He will return for an office visit in 6 months.  He will call for new skin findings or symptoms in the interim.  Kent Papas, MD  09/11/2023  9:14 AM

## 2023-09-29 DIAGNOSIS — D485 Neoplasm of uncertain behavior of skin: Secondary | ICD-10-CM | POA: Diagnosis not present

## 2023-09-29 DIAGNOSIS — L720 Epidermal cyst: Secondary | ICD-10-CM | POA: Diagnosis not present

## 2023-09-29 DIAGNOSIS — L821 Other seborrheic keratosis: Secondary | ICD-10-CM | POA: Diagnosis not present

## 2023-09-29 DIAGNOSIS — D224 Melanocytic nevi of scalp and neck: Secondary | ICD-10-CM | POA: Diagnosis not present

## 2023-09-29 DIAGNOSIS — Z85828 Personal history of other malignant neoplasm of skin: Secondary | ICD-10-CM | POA: Diagnosis not present

## 2023-10-16 IMAGING — CT CT ABD-PELV W/O CM
1 of 2 series · 14 of 32 positions shown, 19 images · non-contrast
Comparison: None Available.

CLINICAL DATA: Right flank pain.



[Series 2: abd/pelvis w/(date) · axial · 0.88mm/px · z∈[-426,-36]mm · 14 of 90 slices shown, 19 images]
[im 6/90  soft-tissue]
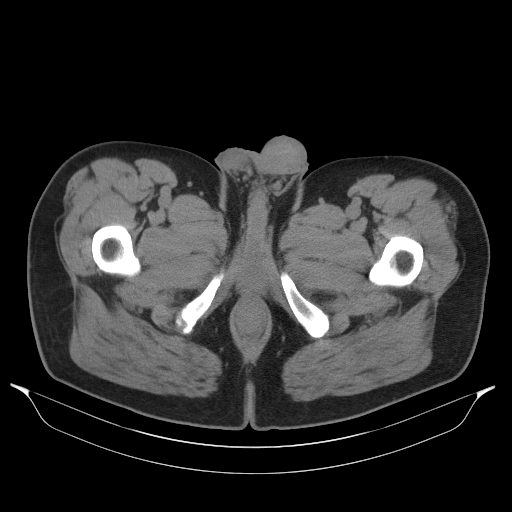
[im 6/90  bone]
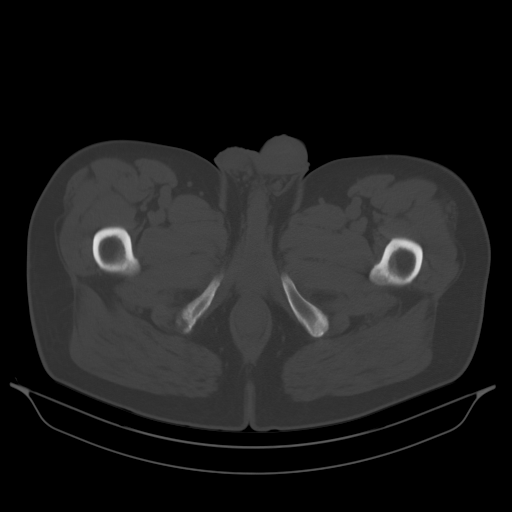
[im 12/90  soft-tissue]
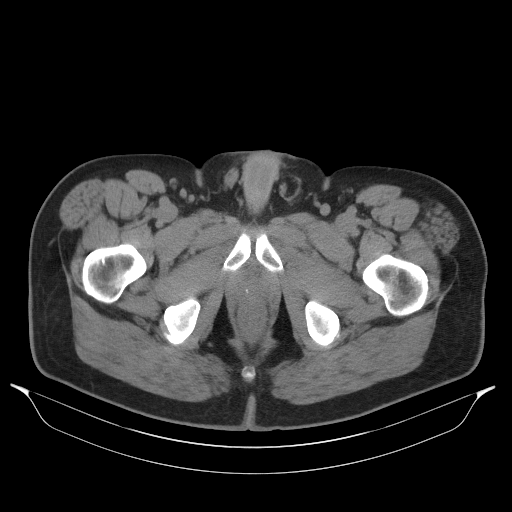
[im 17/90  soft-tissue]
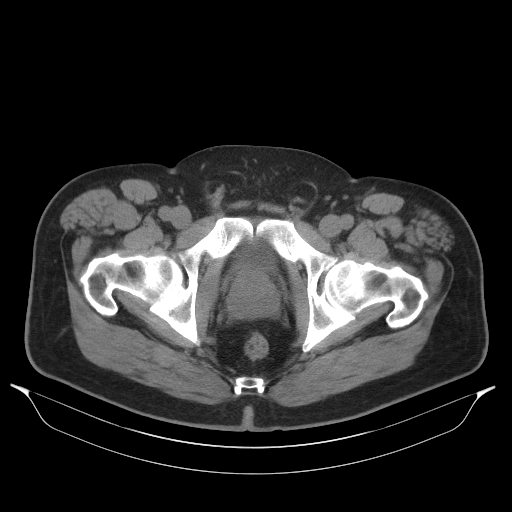
[im 28/90  soft-tissue]
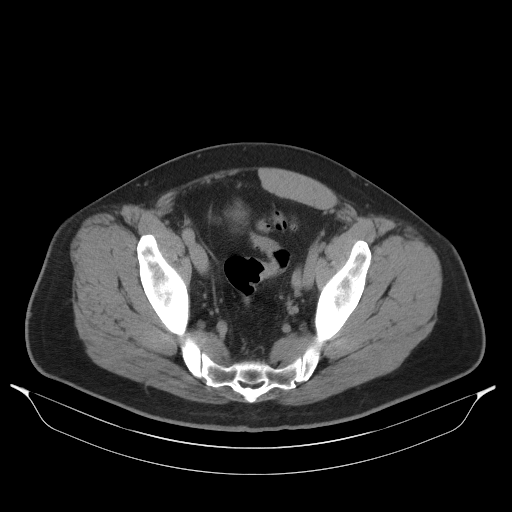
[im 34/90  soft-tissue]
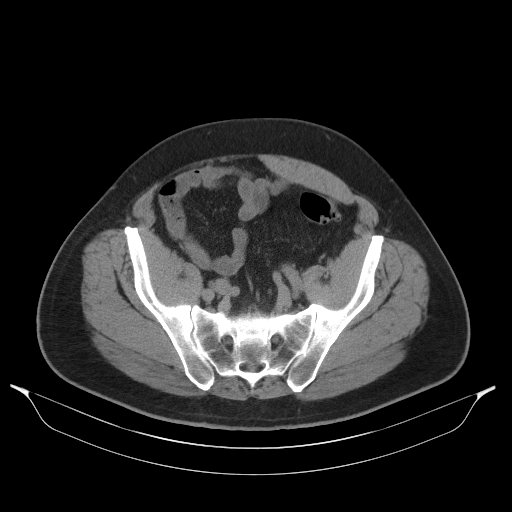
[im 39/90  soft-tissue]
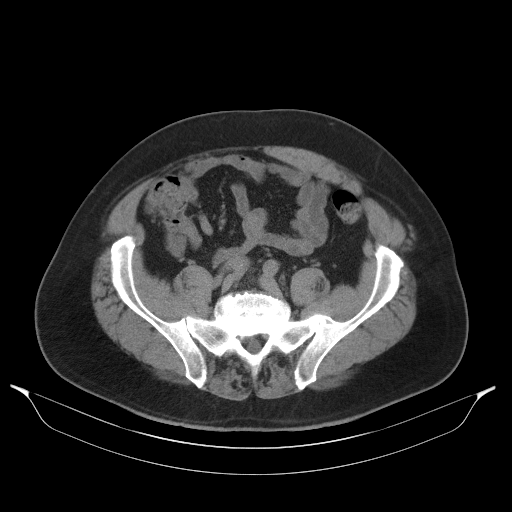
[im 45/90  soft-tissue]
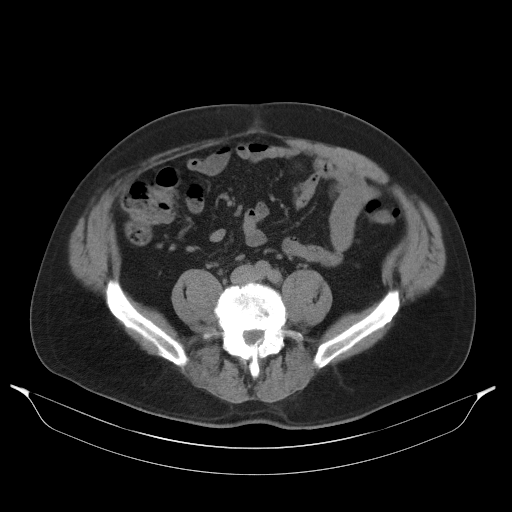
[im 51/90  soft-tissue]
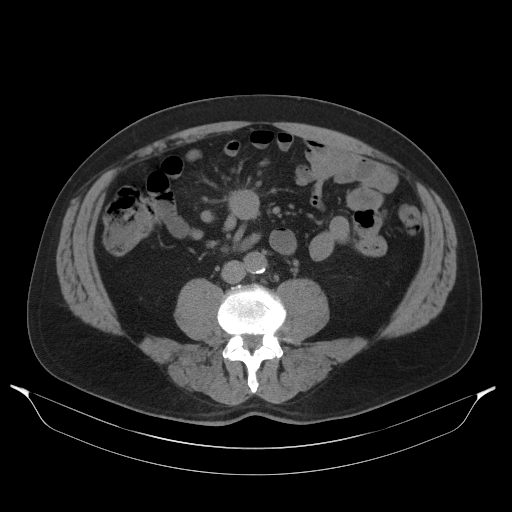
[im 56/90  soft-tissue]
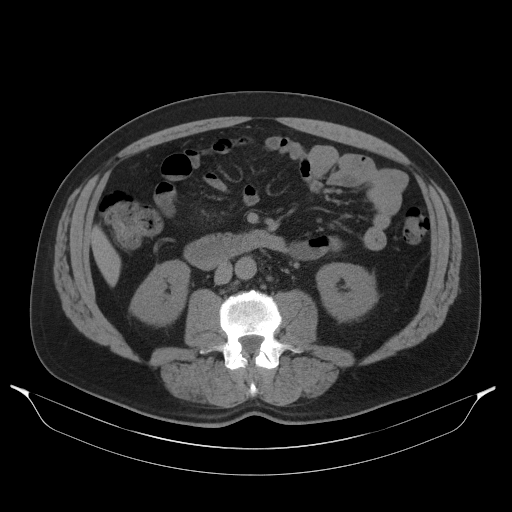
[im 56/90  bone]
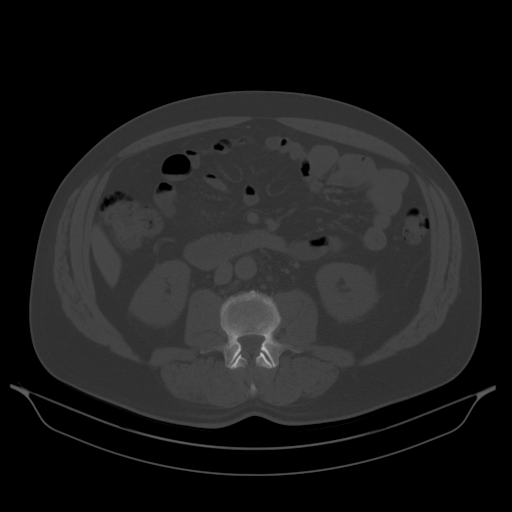
[im 62/90  soft-tissue]
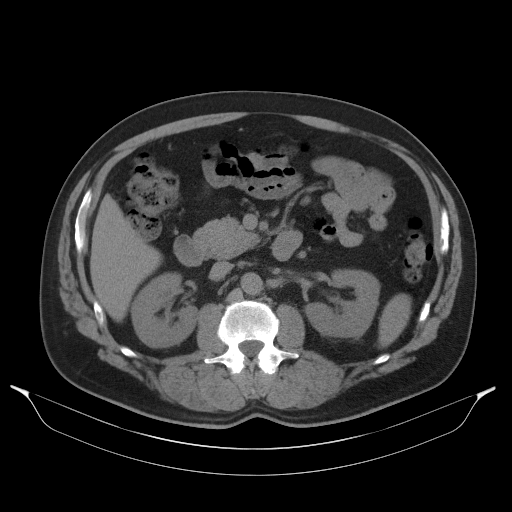
[im 67/90  lung]
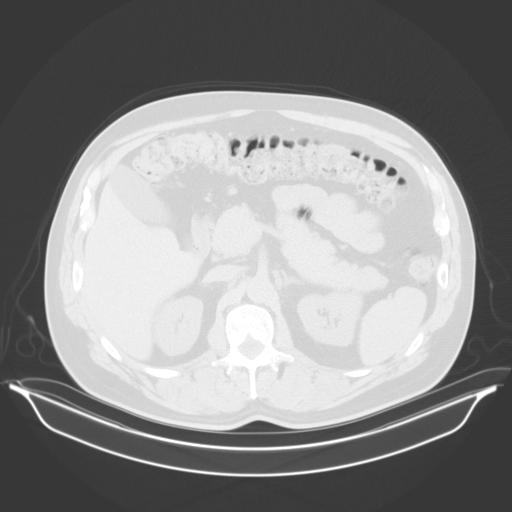
[im 73/90  soft-tissue]
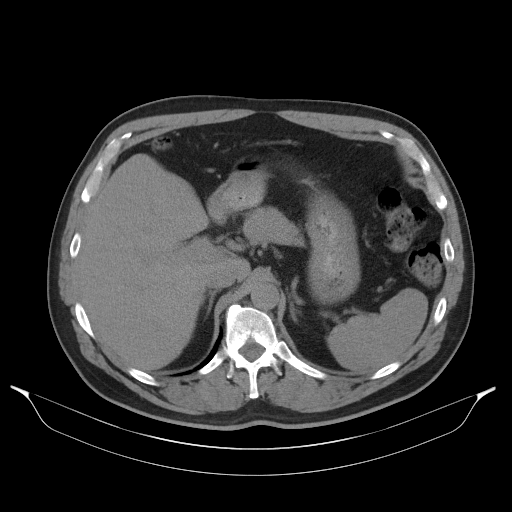
[im 73/90  lung]
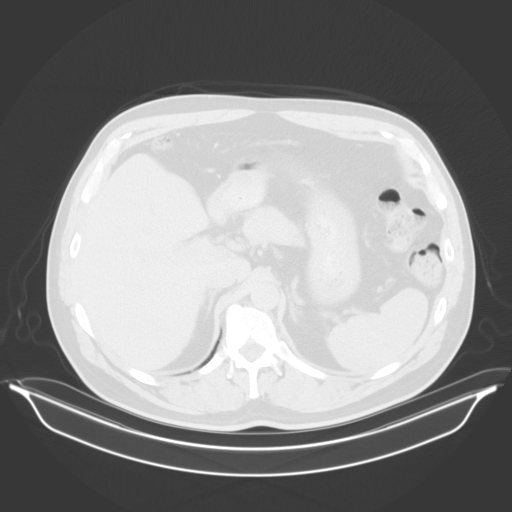
[im 78/90  soft-tissue]
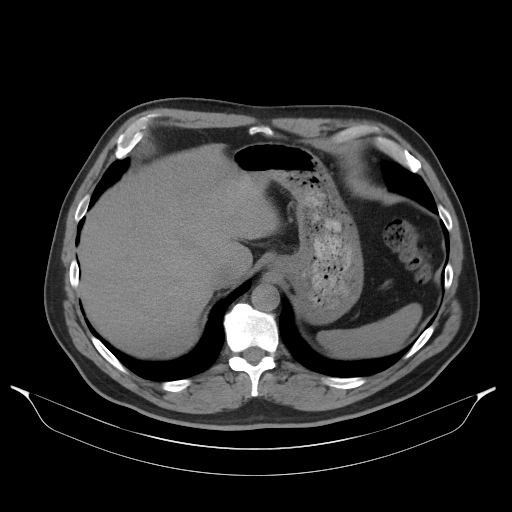
[im 78/90  lung]
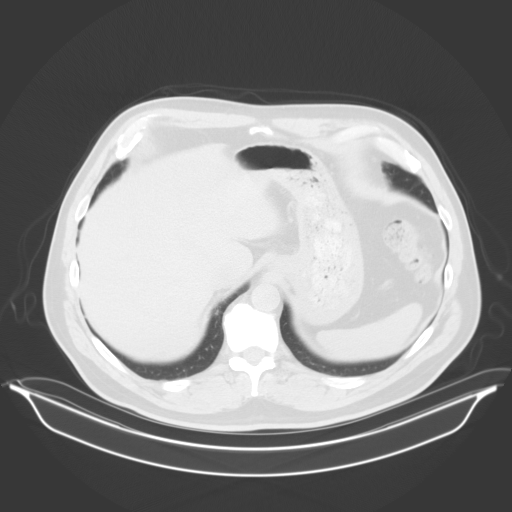
[im 84/90  soft-tissue]
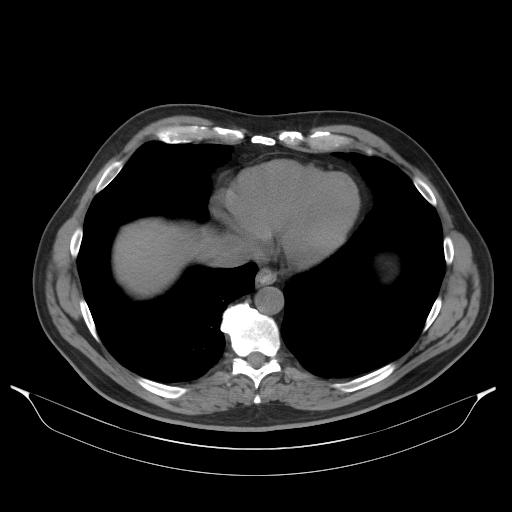
[im 84/90  lung]
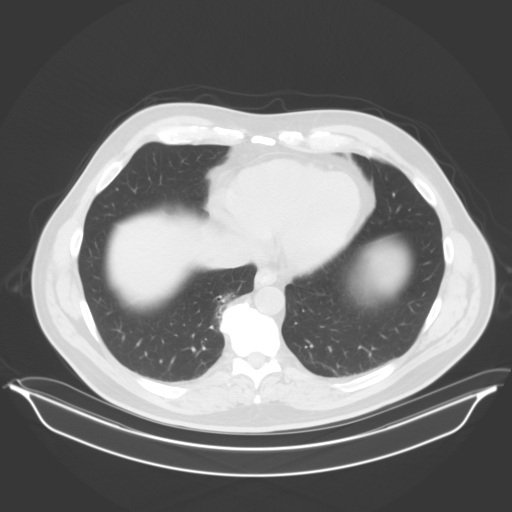

[14 of 32 positions shown; findings below may reference images not displayed]

FINDINGS: Lower chest: No acute abnormality.

Hepatobiliary: No focal liver abnormality is seen. No gallstones,
gallbladder wall thickening, or biliary dilatation.

Pancreas: Unremarkable. No pancreatic ductal dilatation or
surrounding inflammatory changes.

Spleen: Normal in size without focal abnormality.

Adrenals/Urinary Tract: Adrenal glands appear normal. Small
nonobstructive right renal calculus is noted. No hydronephrosis or
renal obstruction is noted. Urinary bladder is unremarkable.

Stomach/Bowel: The stomach appears normal. There is no evidence of
bowel obstruction or inflammation. Status post appendectomy.

Vascular/Lymphatic: Aortic atherosclerosis. No enlarged abdominal or
pelvic lymph nodes.

Reproductive: Prostate is unremarkable.

Other: No hernia or ascites is noted. Inferior portion of right
rectus muscle has atrophied.

Musculoskeletal: No acute or significant osseous findings.
IMPRESSION: Small nonobstructive right renal calculus. No hydronephrosis or
renal obstruction is noted.

Aortic Atherosclerosis ([id number]).

## 2023-11-16 ENCOUNTER — Emergency Department (HOSPITAL_BASED_OUTPATIENT_CLINIC_OR_DEPARTMENT_OTHER)

## 2023-11-16 ENCOUNTER — Emergency Department (HOSPITAL_BASED_OUTPATIENT_CLINIC_OR_DEPARTMENT_OTHER)
Admission: EM | Admit: 2023-11-16 | Discharge: 2023-11-16 | Disposition: A | Discharge status: Home / Self Care | Attending: Emergency Medicine | Admitting: Emergency Medicine

## 2023-11-16 DIAGNOSIS — R1011 Right upper quadrant pain: Secondary | ICD-10-CM | POA: Insufficient documentation

## 2023-11-16 DIAGNOSIS — R748 Abnormal levels of other serum enzymes: Secondary | ICD-10-CM | POA: Diagnosis not present

## 2023-11-16 DIAGNOSIS — R1110 Vomiting, unspecified: Secondary | ICD-10-CM | POA: Insufficient documentation

## 2023-11-16 DIAGNOSIS — R112 Nausea with vomiting, unspecified: Secondary | ICD-10-CM | POA: Diagnosis not present

## 2023-11-16 DIAGNOSIS — R109 Unspecified abdominal pain: Secondary | ICD-10-CM

## 2023-11-16 DIAGNOSIS — K828 Other specified diseases of gallbladder: Secondary | ICD-10-CM | POA: Diagnosis not present

## 2023-11-16 LAB — URINALYSIS, W/ REFLEX TO CULTURE (INFECTION SUSPECTED)
Bacteria, UA: NONE SEEN
Bilirubin Urine: NEGATIVE
Glucose, UA: NEGATIVE mg/dL
Hgb urine dipstick: NEGATIVE
Ketones, ur: >80 mg/dL — AB
Leukocytes,Ua: NEGATIVE
Nitrite: NEGATIVE
Protein, ur: 30 mg/dL — AB
Specific Gravity, Urine: 1.035 — ABNORMAL HIGH (ref 1.005–1.030)
pH: 5.5 (ref 5.0–8.0)

## 2023-11-16 LAB — CBC WITH DIFFERENTIAL/PLATELET
Abs Immature Granulocytes: 0.02 10*3/uL (ref 0.00–0.07)
Basophils Absolute: 0 10*3/uL (ref 0.0–0.1)
Basophils Relative: 0 %
Eosinophils Absolute: 0 10*3/uL (ref 0.0–0.5)
Eosinophils Relative: 0 %
HCT: 48.2 % (ref 39.0–52.0)
Hemoglobin: 16.1 g/dL (ref 13.0–17.0)
Immature Granulocytes: 0 %
Lymphocytes Relative: 8 %
Lymphs Abs: 0.7 10*3/uL (ref 0.7–4.0)
MCH: 26.9 pg (ref 26.0–34.0)
MCHC: 33.4 g/dL (ref 30.0–36.0)
MCV: 80.5 fL (ref 80.0–100.0)
Monocytes Absolute: 0.8 10*3/uL (ref 0.1–1.0)
Monocytes Relative: 10 %
Neutro Abs: 7 10*3/uL (ref 1.7–7.7)
Neutrophils Relative %: 82 %
Platelets: 214 10*3/uL (ref 150–400)
RBC: 5.99 MIL/uL — ABNORMAL HIGH (ref 4.22–5.81)
RDW: 15.8 % — ABNORMAL HIGH (ref 11.5–15.5)
WBC: 8.6 10*3/uL (ref 4.0–10.5)
nRBC: 0 % (ref 0.0–0.2)

## 2023-11-16 LAB — COMPREHENSIVE METABOLIC PANEL WITH GFR
ALT: 32 U/L (ref 0–44)
AST: 23 U/L (ref 15–41)
Albumin: 4.5 g/dL (ref 3.5–5.0)
Alkaline Phosphatase: 107 U/L (ref 38–126)
Anion gap: 15 (ref 5–15)
BUN: 16 mg/dL (ref 8–23)
CO2: 21 mmol/L — ABNORMAL LOW (ref 22–32)
Calcium: 9.6 mg/dL (ref 8.9–10.3)
Chloride: 99 mmol/L (ref 98–111)
Creatinine, Ser: 1.04 mg/dL (ref 0.61–1.24)
GFR, Estimated: >60 mL/min (ref >60)
Glucose, Bld: 144 mg/dL — ABNORMAL HIGH (ref 70–99)
Potassium: 3.8 mmol/L (ref 3.5–5.1)
Sodium: 135 mmol/L (ref 135–145)
Total Bilirubin: 0.8 mg/dL (ref 0.0–1.2)
Total Protein: 7.5 g/dL (ref 6.5–8.1)

## 2023-11-16 LAB — LIPASE, BLOOD: Lipase: 73 U/L — ABNORMAL HIGH (ref 11–51)

## 2023-11-16 MED ORDER — OXYCODONE-ACETAMINOPHEN 5-325 MG PO TABS: 1.0000 | ORAL_TABLET | Freq: Four times a day (QID) | ORAL | 0 refills | Status: DC | PRN

## 2023-11-16 MED ORDER — HYDROMORPHONE HCL 1 MG/ML IJ SOLN: 2.0000 mg | Freq: Once | INTRAMUSCULAR | Status: DC

## 2023-11-16 MED ORDER — HYDROMORPHONE HCL 1 MG/ML IJ SOLN
0.5000 mg | Freq: Once | INTRAMUSCULAR | Status: AC
  Administered 2023-11-16: 0.5 mg via INTRAVENOUS
  Filled 2023-11-16: qty 1

## 2023-11-16 MED ORDER — ONDANSETRON HCL 4 MG/2ML IJ SOLN
4.0000 mg | Freq: Once | INTRAMUSCULAR | Status: AC
  Administered 2023-11-16: 4 mg via INTRAVENOUS
  Filled 2023-11-16: qty 2

## 2023-11-16 MED ORDER — SODIUM CHLORIDE 0.9 % IV BOLUS
1000.0000 mL | Freq: Once | INTRAVENOUS | Status: AC
  Administered 2023-11-16: 1000 mL via INTRAVENOUS

## 2023-11-16 MED ORDER — SODIUM CHLORIDE 0.9 % IV SOLN: INTRAVENOUS | Status: DC

## 2023-11-16 NOTE — ED Notes (Signed)
 Dc instructions reviewed with patient. Patient voiced understanding. Dc with belongings.

## 2023-11-16 NOTE — ED Notes (Signed)
Patient not able to give urine sample at this time 

## 2023-11-16 NOTE — ED Provider Notes (Signed)
 [place] [facility] AT [facility] Provider Note   CSN: [id number] Arrival date & time: 11/16/23  4010     History  Chief Complaint  Patient presents with   Abdominal Pain    Kent Montgomery is a 68 y.o. male.  68 year old male presents with upper quadrant pain which began yesterday afternoon.  Patient states he had tacos and shortly after began developing upper quadrant pain with associate nonbilious emesis.  Denies any fever or chills.  No diarrhea.  Has had this before in the past and it resolved.  No cardiac symptomatology.  Prior history of appendectomy       Home Medications Prior to Admission medications   Medication Sig Start Date End Date Taking? Authorizing Provider  Coenzyme Q10 (COQ10 PO) Take 1 tablet by mouth daily.    [provider]  cyanocobalamin  (VITAMIN B12) 1000 MCG tablet Take 1,000 mcg by mouth daily.    [provider]  MEGARED OMEGA-3 KRILL OIL PO Take 1 capsule by mouth daily.    [provider]  Multiple Vitamin (MULTIVITAMIN WITH MINERALS) TABS tablet Take 1 tablet by mouth daily.    [provider]  Probiotic Product (PROBIOTIC DAILY) CAPS Take 1 capsule by mouth daily.    [provider]  PSYLLIUM HUSK PO Take 5-6 capsules by mouth 2 (two) times daily.     [provider]  TURMERIC PO Take 2 tablets by mouth daily.    [provider]      Allergies    Droperidol, Pravastatin, and Cefuroxime axetil    Review of Systems   Review of Systems  All other systems reviewed and are negative.   Physical Exam Updated Vital Signs BP (!) 165/104 (BP Location: Right Arm)   Pulse 99   Temp 98.9 F (37.2 C)   Resp 17   SpO2 98%  Physical Exam Vitals and nursing note reviewed.  Constitutional:      General: He is not in acute distress.    Appearance: Normal appearance. He is well-developed. He is not toxic-appearing.  HENT:     Head: Normocephalic and  atraumatic.  Eyes:     General: Lids are normal.     Conjunctiva/sclera: Conjunctivae normal.     Pupils: Pupils are equal, round, and reactive to light.  Neck:     Thyroid: No thyroid mass.     Trachea: No tracheal deviation.  Cardiovascular:     Rate and Rhythm: Normal rate and regular rhythm.     Heart sounds: Normal heart sounds. No murmur heard.    No gallop.  Pulmonary:     Effort: Pulmonary effort is normal. No respiratory distress.     Breath sounds: Normal breath sounds. No stridor. No decreased breath sounds, wheezing, rhonchi or rales.  Abdominal:     General: There is no distension.     Palpations: Abdomen is soft.     Tenderness: There is abdominal tenderness in the right upper quadrant. There is guarding. There is no rebound.    Musculoskeletal:        General: No tenderness. Normal range of motion.     Cervical back: Normal range of motion and neck supple.  Skin:    General: Skin is warm and dry.     Findings: No abrasion or rash.  Neurological:     Mental Status: He is alert and oriented to person, place, and time. Mental status is at baseline.     GCS: GCS  eye subscore is 4. GCS verbal subscore is 5. GCS motor subscore is 6.     Cranial Nerves: No cranial nerve deficit.     Sensory: No sensory deficit.     Motor: Motor function is intact.  Psychiatric:        Attention and Perception: Attention normal.        Speech: Speech normal.        Behavior: Behavior normal.     ED Results / Procedures / Treatments   Labs (all labs ordered are listed, but only abnormal results are displayed) Labs Reviewed  CBC WITH DIFFERENTIAL/PLATELET  COMPREHENSIVE METABOLIC PANEL WITH GFR  LIPASE, BLOOD  URINALYSIS, W/ REFLEX TO CULTURE (INFECTION SUSPECTED)    EKG None  Radiology No results found.  Procedures Procedures    Medications Ordered in ED Medications  0.9 %  sodium chloride  infusion (has no administration in time range)  sodium chloride  0.9 % bolus  1,000 mL (has no administration in time range)  ondansetron  (ZOFRAN ) injection 4 mg (has no administration in time range)    ED Course/ Medical Decision Making/ A&P                                 Medical Decision Making Amount and/or Complexity of Data Reviewed Labs: ordered. Radiology: ordered.  Risk Prescription drug management.  Patient medicated for pain and feels much better.  Labs are remarkable only for slightly elevated lipase.  Repeat abdominal exam is benign at this time.  Abdominal ultrasound shows possible sludge in his gallbladder.  Discussed with general surgery PA who spoke with her attending who stated that patient would require CT.  Reports we do not have CT here at this time.  Patient and wife at bedside informed about this.  With his pain being controlled, he is well comfortable going home and has been given explicit return precautions to go to [name].  Will also get a short prescription of opiates.         Final Clinical Impression(s) / ED Diagnoses Final diagnoses:  None    Rx / DC Orders ED Discharge Orders     None         Lind Repine, MD 11/16/23 [phone]

## 2023-11-16 NOTE — Discharge Instructions (Addendum)
 Go to [facility] if you develop fever, worsening pain, uncontrollable vomiting, or any other problems

## 2023-11-16 NOTE — ED Notes (Signed)
 Pt unable to void at this time, aware we need a urine sample.

## 2023-11-16 NOTE — ED Triage Notes (Signed)
 Pt caox4, ambulatory c/o abd pain starting in the RUQ radiating across the whole upper abd with N/V that started yesterday afternoon. Pt does still have gallbladder.

## 2023-11-16 NOTE — ED Notes (Signed)
 Called Kim at [facility] for gen surg consult 09:02

## 2023-11-19 NOTE — Progress Notes (Unsigned)
 Brigitte Canard, PA-C [address] [place], [place]  [address] Phone: [phone]   Primary Care Physician: Colene Dauphin, MD  Primary Gastroenterologist:  Brigitte Canard, PA-C / Dr. Laurell Pond   Chief Complaint: ED follow-up RUQ abdominal pain       HPI:   Kent Montgomery is a 68 y.o. male, established patient Dr. Bridgett Camps, presents for ED follow-up abdominal pain.  PMH: Cutaneous T-cell lymphoma of the scalp.  Patient went to [facility] ED at [facility] 11/16/2023 to evaluate acute right upper abdominal pain with nausea and vomiting for 1 day.  Started after he ate tacos.  No fever, chills, or diarrhea.  He has had previous appendectomy.  Still has gallbladder.  Alcohol ?  11/16/2023 labs: Normal CBC (WBC 8.6, Hgb 16.1).  Elevated lipase 73.  Normal LFTs.  Glucose 144, BUN 16, creatinine 1.04, GFR greater than 60.  UA positive for ketones and protein.  11/16/2023 RUQ ultrasound: Equivocal for sludge.  No gallstones.  Normal CBD 3 mm.  Negative Murphy sign.  Normal liver.  04/2023 whole-body PET scan: 14 mm soft tissue lesion along the left anterior vertex, compatible with known scalp lymphoma.  No evidence of metastatic disease.  Patient has personal history of multiple adenomas and sessile serrated polyps.  Colonoscopy May 2021 (7 adenomas and SSP).  Last colonoscopy July 2024 (6 small adenoma polyps removed).  Small internal hemorrhoids.  3 repeat colonoscopy will be due 12/2025.  Last EGD 01/2016: Normal.  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  Current Outpatient Medications  Medication Sig Dispense Refill   Coenzyme Q10 (COQ10 PO) Take 1 tablet by mouth daily.     cyanocobalamin  (VITAMIN B12) 1000 MCG tablet Take 1,000 mcg by mouth daily.     MEGARED OMEGA-3 KRILL OIL PO Take 1 capsule by mouth daily.     Multiple Vitamin (MULTIVITAMIN WITH MINERALS) TABS tablet Take 1 tablet by mouth daily.     oxyCODONE-acetaminophen  (PERCOCET/ROXICET) 5-325 MG  tablet Take 1 tablet by mouth every 6 (six) hours as needed for severe pain (pain score 7-10). 10 tablet 0   Probiotic Product (PROBIOTIC DAILY) CAPS Take 1 capsule by mouth daily.     PSYLLIUM HUSK PO Take 5-6 capsules by mouth 2 (two) times daily.      TURMERIC PO Take 2 tablets by mouth daily.     No current facility-administered medications for this visit.    Allergies as of 11/20/2023 - Review Complete 11/16/2023  Allergen Reaction Noted   Droperidol Anaphylaxis 10/26/2010   Pravastatin Other (See Comments) 02/13/2022   Cefuroxime axetil Rash 10/26/2010    Past Medical History:  Diagnosis Date   Allergy    Blood transfusion without reported diagnosis    Colon polyp    Elevated transaminase level    GERD (gastroesophageal reflux disease)    History of kidney stones    Hyperlipidemia    Lower GI bleed    Sigmoid diverticulosis    Skin cancer    basil cell rt arm   Symptomatic anemia     Past Surgical History:  Procedure Laterality Date   APPENDECTOMY     ASPIRATION BIOPSY     of eye cyst   BACK SURGERY     BACK SURGERY     COLONOSCOPY N/A 01/28/2016   Procedure: COLONOSCOPY;  Surgeon: Danette Duos, MD;  Location: [facility] ENDOSCOPY;  Service: Gastroenterology;  Laterality: N/A;   COLONOSCOPY  2015  ELBOW ARTHROSCOPY     ESOPHAGOGASTRODUODENOSCOPY N/A 01/28/2016   Procedure: ESOPHAGOGASTRODUODENOSCOPY (EGD);  Surgeon: Danette Duos, MD;  Location: [facility] ENDOSCOPY;  Service: Gastroenterology;  Laterality: N/A;   GIVENS CAPSULE STUDY N/A 01/30/2016   Procedure: GIVENS CAPSULE STUDY;  Surgeon: Kenney Peacemaker, MD;  Location: [facility] ENDOSCOPY;  Service: Endoscopy;  Laterality: N/A;   MASS EXCISION Right 01/22/2018   Procedure: EXCISION OF 10 CM MASS ON RIGHT UPPER BACK ERAS PATHWAY;  Surgeon: Boyce Byes, MD;  Location: [facility] ORS;  Service: General;  Laterality: Right;   SMALL BOWEL ENTEROSCOPY N/A 02/2016   double balloon    Review of Systems:    All systems  reviewed and negative except where noted in HPI.    Physical Exam:  There were no vitals taken for this visit. No LMP for male patient.  General: Well-nourished, well-developed in no acute distress.  Lungs: Clear to auscultation bilaterally. Non-labored. Heart: Regular rate and rhythm, no murmurs rubs or gallops.  Abdomen: Bowel sounds are normal; Abdomen is Soft; No hepatosplenomegaly, masses or hernias;  No Abdominal Tenderness; No guarding or rebound tenderness. Neuro: Alert and oriented x 3.  Grossly intact.  Psych: Alert and cooperative, normal mood and affect.   Imaging Studies: US  Abdomen Limited RUQ (LIVER/GB) Result Date: 11/16/2023 CLINICAL DATA:  Right upper quadrant pain with nausea and vomiting for 1 day. EXAM: ULTRASOUND ABDOMEN LIMITED RIGHT UPPER QUADRANT COMPARISON:  CT from 10/24/2021 FINDINGS: Gallbladder: No gallstones, gallbladder wall thickening, or pericholecystic fluid. Equivocal for sludge. Negative sonographic Murphy's sign. Common bile duct: Diameter: 3 mm.  No intrahepatic bile duct dilatation. Liver: No focal lesion identified. Within normal limits in parenchymal echogenicity. Portal vein is patent on color Doppler imaging with normal direction of blood flow towards the liver. Other: Exam detail is diminished due to overlying bowel gas. IMPRESSION: 1. No acute findings. 2. Equivocal for sludge within the gallbladder. Electronically Signed   By: Kimberley Penman M.D.   On: 11/16/2023 08:47    Labs: CBC    Component Value Date/Time   WBC 8.6 11/16/2023 0725   RBC 5.99 (H) 11/16/2023 0725   HGB 16.1 11/16/2023 0725   HGB 15.1 09/11/2023 0836   HCT 48.2 11/16/2023 0725   PLT 214 11/16/2023 0725   PLT 202 09/11/2023 0836   MCV 80.5 11/16/2023 0725   MCH 26.9 11/16/2023 0725   MCHC 33.4 11/16/2023 0725   RDW 15.8 (H) 11/16/2023 0725   LYMPHSABS 0.7 11/16/2023 0725   MONOABS 0.8 11/16/2023 0725   EOSABS 0.0 11/16/2023 0725   BASOSABS 0.0 11/16/2023 0725     CMP     Component Value Date/Time   NA 135 11/16/2023 0725   K 3.8 11/16/2023 0725   CL 99 11/16/2023 0725   CO2 21 (L) 11/16/2023 0725   GLUCOSE 144 (H) 11/16/2023 0725   BUN 16 11/16/2023 0725   CREATININE 1.04 11/16/2023 0725   CREATININE 1.23 05/06/2023 1500   CALCIUM 9.6 11/16/2023 0725   PROT 7.5 11/16/2023 0725   ALBUMIN 4.5 11/16/2023 0725   AST 23 11/16/2023 0725   AST 23 05/06/2023 1500   ALT 32 11/16/2023 0725   ALT 35 05/06/2023 1500   ALKPHOS 107 11/16/2023 0725   BILITOT 0.8 11/16/2023 0725   BILITOT 0.7 05/06/2023 1500   GFRNONAA >60 11/16/2023 0725   GFRNONAA >60 05/06/2023 1500   GFRAA >60 01/09/2018 0836       Assessment and Plan:   Verdie Gladden is a  68 y.o. y/o male   1.  Acute RUQ pain  2.  Elevated lipase    Brigitte Canard, PA-C  Follow up ***

## 2023-11-20 ENCOUNTER — Other Ambulatory Visit (INDEPENDENT_AMBULATORY_CARE_PROVIDER_SITE_OTHER)

## 2023-11-20 ENCOUNTER — Encounter: Payer: Self-pay | Admitting: Physician Assistant

## 2023-11-20 ENCOUNTER — Ambulatory Visit: Admitting: Physician Assistant

## 2023-11-20 ENCOUNTER — Other Ambulatory Visit

## 2023-11-20 ENCOUNTER — Ambulatory Visit: Payer: Self-pay | Admitting: Physician Assistant

## 2023-11-20 VITALS — BP 120/80 | HR 90 | Ht 70.0 in | Wt 229.8 lb

## 2023-11-20 DIAGNOSIS — R112 Nausea with vomiting, unspecified: Secondary | ICD-10-CM

## 2023-11-20 DIAGNOSIS — R1011 Right upper quadrant pain: Secondary | ICD-10-CM

## 2023-11-20 DIAGNOSIS — R748 Abnormal levels of other serum enzymes: Secondary | ICD-10-CM

## 2023-11-20 DIAGNOSIS — R935 Abnormal findings on diagnostic imaging of other abdominal regions, including retroperitoneum: Secondary | ICD-10-CM

## 2023-11-20 LAB — HEPATIC FUNCTION PANEL
ALT: 23 U/L (ref 0–53)
AST: 18 U/L (ref 0–37)
Albumin: 4.1 g/dL (ref 3.5–5.2)
Alkaline Phosphatase: 86 U/L (ref 39–117)
Bilirubin, Direct: 0.1 mg/dL (ref 0.0–0.3)
Total Bilirubin: 0.6 mg/dL (ref 0.2–1.2)
Total Protein: 6.8 g/dL (ref 6.0–8.3)

## 2023-11-20 LAB — LIPASE: Lipase: 47 U/L (ref 11.0–59.0)

## 2023-11-20 NOTE — Progress Notes (Signed)
 Addendum: Reviewed and agree with assessment and management plan. Asha Grumbine, Carie Caddy, MD

## 2023-11-20 NOTE — Patient Instructions (Signed)
 /You have been scheduled for a HIDA scan at [facility] Radiology (1st floor) on 12/01/2023 Please arrive 30 minutes prior to your scheduled appointment at  7:30 am. Make certain not to have anything to eat or drink at least 6 hours prior to your test. Should this appointment date or time not work well for you, please call radiology scheduling at [phone].  _____________________________________________________________________ hepatobiliary (HIDA) scan is an imaging procedure used to diagnose problems in the liver, gallbladder and bile ducts. In the HIDA scan, a radioactive chemical or tracer is injected into a vein in your arm. The tracer is handled by the liver like bile. Bile is a fluid produced and excreted by your liver that helps your digestive system break down fats in the foods you eat. Bile is stored in your gallbladder and the gallbladder releases the bile when you eat a meal. A special nuclear medicine scanner (gamma camera) tracks the flow of the tracer from your liver into your gallbladder and small intestine.  During your HIDA scan  You'll be asked to change into a hospital gown before your HIDA scan begins. Your health care team will position you on a table, usually on your back. The radioactive tracer is then injected into a vein in your arm.The tracer travels through your bloodstream to your liver, where it's taken up by the bile-producing cells. The radioactive tracer travels with the bile from your liver into your gallbladder and through your bile ducts to your small intestine.You may feel some pressure while the radioactive tracer is injected into your vein. As you lie on the table, a special gamma camera is positioned over your abdomen taking pictures of the tracer as it moves through your body. The gamma camera takes pictures continually for about an hour. You'll need to keep still during the HIDA scan. This can become uncomfortable, but you may find that you can lessen the discomfort by  taking deep breaths and thinking about other things. Tell your health care team if you're uncomfortable. The radiologist will watch on a computer the progress of the radioactive tracer through your body. The HIDA scan may be stopped when the radioactive tracer is seen in the gallbladder and enters your small intestine. This typically takes about an hour. In some cases extra imaging will be performed if original images aren't satisfactory, if morphine is given to help visualize the gallbladder or if the medication CCK is given to look at the contraction of the gallbladder. This test typically takes 2 hours to complete. ________________________________________________________________________   Kent Montgomery have been scheduled for an MRI/MRCP at [facility]  on 11/24/2023. Your appointment time is 7:30 am. Please arrive to admitting (at main entrance of the hospital) 30 minutes prior to your appointment time for registration purposes. Please make certain not to have anything to eat or drink 6 hours prior to your test. In addition, if you have any metal in your body, have a pacemaker or defibrillator, please be sure to let your ordering physician know. This test typically takes 45 minutes to 1 hour to complete. Should you need to reschedule, please call [phone] to do so.  Your provider has requested that you go to the basement level for lab work before leaving today. Press "B" on the elevator. The lab is located at the first door on the left as you exit the elevator.   Due to recent changes in healthcare laws, you may see the results of your imaging and laboratory studies on MyChart before your  provider has had a chance to review them.  We understand that in some cases there may be results that are confusing or concerning to you. Not all laboratory results come back in the same time frame and the provider may be waiting for multiple results in order to interpret others.  Please give us  48 hours in order for your provider to  thoroughly review all the results before contacting the office for clarification of your results.    I appreciate the  opportunity to care for you  Thank You   Tina Garrett,PA-C

## 2023-11-24 ENCOUNTER — Ambulatory Visit (HOSPITAL_COMMUNITY)
Admission: RE | Admit: 2023-11-24 | Discharge: 2023-11-24 | Disposition: A | Discharge status: Home / Self Care | Source: Ambulatory Visit | Attending: Physician Assistant | Admitting: Physician Assistant

## 2023-11-24 ENCOUNTER — Other Ambulatory Visit: Payer: Self-pay | Admitting: Physician Assistant

## 2023-11-24 DIAGNOSIS — R1011 Right upper quadrant pain: Secondary | ICD-10-CM | POA: Diagnosis not present

## 2023-11-24 DIAGNOSIS — R748 Abnormal levels of other serum enzymes: Secondary | ICD-10-CM | POA: Diagnosis not present

## 2023-11-24 DIAGNOSIS — R935 Abnormal findings on diagnostic imaging of other abdominal regions, including retroperitoneum: Secondary | ICD-10-CM | POA: Insufficient documentation

## 2023-11-24 DIAGNOSIS — R112 Nausea with vomiting, unspecified: Secondary | ICD-10-CM | POA: Insufficient documentation

## 2023-11-24 DIAGNOSIS — K838 Other specified diseases of biliary tract: Secondary | ICD-10-CM | POA: Diagnosis not present

## 2023-11-24 DIAGNOSIS — R1110 Vomiting, unspecified: Secondary | ICD-10-CM | POA: Diagnosis not present

## 2023-11-24 MED ORDER — GADOBUTROL 1 MMOL/ML IV SOLN
10.0000 mL | Freq: Once | INTRAVENOUS | Status: AC | PRN
  Administered 2023-11-24: 10 mL via INTRAVENOUS

## 2023-12-01 ENCOUNTER — Ambulatory Visit (HOSPITAL_COMMUNITY)
Admission: RE | Admit: 2023-12-01 | Discharge: 2023-12-01 | Disposition: A | Discharge status: Home / Self Care | Source: Ambulatory Visit | Attending: Physician Assistant | Admitting: Physician Assistant

## 2023-12-01 DIAGNOSIS — R748 Abnormal levels of other serum enzymes: Secondary | ICD-10-CM | POA: Diagnosis not present

## 2023-12-01 DIAGNOSIS — R1011 Right upper quadrant pain: Secondary | ICD-10-CM | POA: Diagnosis not present

## 2023-12-01 DIAGNOSIS — R935 Abnormal findings on diagnostic imaging of other abdominal regions, including retroperitoneum: Secondary | ICD-10-CM

## 2023-12-01 DIAGNOSIS — R112 Nausea with vomiting, unspecified: Secondary | ICD-10-CM

## 2023-12-01 MED ORDER — TECHNETIUM TC 99M MEBROFENIN IV KIT
5.0000 | PACK | Freq: Once | INTRAVENOUS | Status: AC
Start: 2023-12-01 — End: 2023-12-01
  Administered 2023-12-01: 5.34 via INTRAVENOUS

## 2023-12-22 DIAGNOSIS — L821 Other seborrheic keratosis: Secondary | ICD-10-CM | POA: Diagnosis not present

## 2023-12-22 DIAGNOSIS — L905 Scar conditions and fibrosis of skin: Secondary | ICD-10-CM | POA: Diagnosis not present

## 2023-12-22 DIAGNOSIS — Z85828 Personal history of other malignant neoplasm of skin: Secondary | ICD-10-CM | POA: Diagnosis not present

## 2024-01-27 ENCOUNTER — Inpatient Hospital Stay (HOSPITAL_BASED_OUTPATIENT_CLINIC_OR_DEPARTMENT_OTHER)
Admission: EM | Admit: 2024-01-27 | Discharge: 2024-02-02 | DRG: 330 | Disposition: A | Discharge status: Home / Self Care | Attending: General Surgery | Admitting: General Surgery

## 2024-01-27 ENCOUNTER — Emergency Department (HOSPITAL_BASED_OUTPATIENT_CLINIC_OR_DEPARTMENT_OTHER)

## 2024-01-27 ENCOUNTER — Other Ambulatory Visit: Payer: Self-pay

## 2024-01-27 ENCOUNTER — Encounter (HOSPITAL_BASED_OUTPATIENT_CLINIC_OR_DEPARTMENT_OTHER): Payer: Self-pay

## 2024-01-27 DIAGNOSIS — Z8249 Family history of ischemic heart disease and other diseases of the circulatory system: Secondary | ICD-10-CM

## 2024-01-27 DIAGNOSIS — Z87442 Personal history of urinary calculi: Secondary | ICD-10-CM | POA: Diagnosis not present

## 2024-01-27 DIAGNOSIS — Z6830 Body mass index (BMI) 30.0-30.9, adult: Secondary | ICD-10-CM | POA: Diagnosis not present

## 2024-01-27 DIAGNOSIS — Z923 Personal history of irradiation: Secondary | ICD-10-CM

## 2024-01-27 DIAGNOSIS — K56609 Unspecified intestinal obstruction, unspecified as to partial versus complete obstruction: Principal | ICD-10-CM | POA: Diagnosis present

## 2024-01-27 DIAGNOSIS — D490 Neoplasm of unspecified behavior of digestive system: Secondary | ICD-10-CM | POA: Diagnosis not present

## 2024-01-27 DIAGNOSIS — Z8042 Family history of malignant neoplasm of prostate: Secondary | ICD-10-CM

## 2024-01-27 DIAGNOSIS — Z83719 Family history of colon polyps, unspecified: Secondary | ICD-10-CM | POA: Diagnosis not present

## 2024-01-27 DIAGNOSIS — Z888 Allergy status to other drugs, medicaments and biological substances status: Secondary | ICD-10-CM | POA: Diagnosis not present

## 2024-01-27 DIAGNOSIS — C772 Secondary and unspecified malignant neoplasm of intra-abdominal lymph nodes: Secondary | ICD-10-CM | POA: Diagnosis not present

## 2024-01-27 DIAGNOSIS — E669 Obesity, unspecified: Secondary | ICD-10-CM | POA: Diagnosis present

## 2024-01-27 DIAGNOSIS — K4090 Unilateral inguinal hernia, without obstruction or gangrene, not specified as recurrent: Secondary | ICD-10-CM | POA: Diagnosis not present

## 2024-01-27 DIAGNOSIS — K5651 Intestinal adhesions [bands], with partial obstruction: Secondary | ICD-10-CM | POA: Diagnosis present

## 2024-01-27 DIAGNOSIS — C7A012 Malignant carcinoid tumor of the ileum: Secondary | ICD-10-CM | POA: Diagnosis not present

## 2024-01-27 DIAGNOSIS — C7B04 Secondary carcinoid tumors of peritoneum: Secondary | ICD-10-CM | POA: Diagnosis present

## 2024-01-27 DIAGNOSIS — K219 Gastro-esophageal reflux disease without esophagitis: Secondary | ICD-10-CM | POA: Diagnosis present

## 2024-01-27 DIAGNOSIS — Z800 Family history of malignant neoplasm of digestive organs: Secondary | ICD-10-CM | POA: Diagnosis not present

## 2024-01-27 DIAGNOSIS — C7B01 Secondary carcinoid tumors of distant lymph nodes: Secondary | ICD-10-CM | POA: Diagnosis present

## 2024-01-27 DIAGNOSIS — Z8572 Personal history of non-Hodgkin lymphomas: Secondary | ICD-10-CM | POA: Diagnosis not present

## 2024-01-27 DIAGNOSIS — Z85828 Personal history of other malignant neoplasm of skin: Secondary | ICD-10-CM | POA: Diagnosis not present

## 2024-01-27 DIAGNOSIS — C7B8 Other secondary neuroendocrine tumors: Secondary | ICD-10-CM | POA: Diagnosis not present

## 2024-01-27 DIAGNOSIS — I10 Essential (primary) hypertension: Secondary | ICD-10-CM | POA: Diagnosis present

## 2024-01-27 DIAGNOSIS — C7A019 Malignant carcinoid tumor of the small intestine, unspecified portion: Secondary | ICD-10-CM | POA: Diagnosis not present

## 2024-01-27 DIAGNOSIS — E785 Hyperlipidemia, unspecified: Secondary | ICD-10-CM | POA: Diagnosis present

## 2024-01-27 DIAGNOSIS — Z860100 Personal history of colon polyps, unspecified: Secondary | ICD-10-CM | POA: Diagnosis not present

## 2024-01-27 DIAGNOSIS — C7A8 Other malignant neuroendocrine tumors: Secondary | ICD-10-CM | POA: Diagnosis not present

## 2024-01-27 DIAGNOSIS — K5669 Other partial intestinal obstruction: Secondary | ICD-10-CM | POA: Diagnosis not present

## 2024-01-27 DIAGNOSIS — K56600 Partial intestinal obstruction, unspecified as to cause: Secondary | ICD-10-CM | POA: Diagnosis not present

## 2024-01-27 DIAGNOSIS — K5730 Diverticulosis of large intestine without perforation or abscess without bleeding: Secondary | ICD-10-CM | POA: Diagnosis not present

## 2024-01-27 LAB — COMPREHENSIVE METABOLIC PANEL WITH GFR
ALT: 59 U/L — ABNORMAL HIGH (ref 0–44)
AST: 31 U/L (ref 15–41)
Albumin: 4.6 g/dL (ref 3.5–5.0)
Alkaline Phosphatase: 142 U/L — ABNORMAL HIGH (ref 38–126)
Anion gap: 14 (ref 5–15)
BUN: 12 mg/dL (ref 8–23)
CO2: 22 mmol/L (ref 22–32)
Calcium: 8.9 mg/dL (ref 8.9–10.3)
Chloride: 102 mmol/L (ref 98–111)
Creatinine, Ser: 1.02 mg/dL (ref 0.61–1.24)
GFR, Estimated: >60 mL/min (ref >60)
Glucose, Bld: 116 mg/dL — ABNORMAL HIGH (ref 70–99)
Potassium: 3.9 mmol/L (ref 3.5–5.1)
Sodium: 137 mmol/L (ref 135–145)
Total Bilirubin: 1.1 mg/dL (ref 0.0–1.2)
Total Protein: 7.1 g/dL (ref 6.5–8.1)

## 2024-01-27 LAB — URINALYSIS, ROUTINE W REFLEX MICROSCOPIC
Glucose, UA: NEGATIVE mg/dL
Hgb urine dipstick: NEGATIVE
Ketones, ur: >=80 mg/dL — AB
Leukocytes,Ua: NEGATIVE
Nitrite: NEGATIVE
Protein, ur: 100 mg/dL — AB
Specific Gravity, Urine: >=1.03 (ref 1.005–1.030)
pH: 5.5 (ref 5.0–8.0)

## 2024-01-27 LAB — CBC
HCT: 49.2 % (ref 39.0–52.0)
Hemoglobin: 16.9 g/dL (ref 13.0–17.0)
MCH: 28 pg (ref 26.0–34.0)
MCHC: 34.3 g/dL (ref 30.0–36.0)
MCV: 81.6 fL (ref 80.0–100.0)
Platelets: 218 K/uL (ref 150–400)
RBC: 6.03 MIL/uL — ABNORMAL HIGH (ref 4.22–5.81)
RDW: 14.9 % (ref 11.5–15.5)
WBC: 6.7 K/uL (ref 4.0–10.5)
nRBC: 0 % (ref 0.0–0.2)

## 2024-01-27 LAB — URINALYSIS, MICROSCOPIC (REFLEX)

## 2024-01-27 LAB — LIPASE, BLOOD: Lipase: 44 U/L (ref 11–51)

## 2024-01-27 LAB — HIV ANTIBODY (ROUTINE TESTING W REFLEX): HIV Screen 4th Generation wRfx: NONREACTIVE

## 2024-01-27 MED ORDER — KCL IN DEXTROSE-NACL 20-5-0.45 MEQ/L-%-% IV SOLN
INTRAVENOUS | Status: DC
  Filled 2024-01-27 (×2): qty 1000

## 2024-01-27 MED ORDER — DIPHENHYDRAMINE HCL 50 MG/ML IJ SOLN: 12.5000 mg | Freq: Four times a day (QID) | INTRAMUSCULAR | Status: DC | PRN

## 2024-01-27 MED ORDER — ONDANSETRON 4 MG PO TBDP: 4.0000 mg | ORAL_TABLET | Freq: Four times a day (QID) | ORAL | Status: DC | PRN

## 2024-01-27 MED ORDER — SIMETHICONE 80 MG PO CHEW
40.0000 mg | CHEWABLE_TABLET | Freq: Four times a day (QID) | ORAL | Status: DC | PRN
  Filled 2024-01-27: qty 1

## 2024-01-27 MED ORDER — DIPHENHYDRAMINE HCL 12.5 MG/5ML PO ELIX
12.5000 mg | ORAL_SOLUTION | Freq: Four times a day (QID) | ORAL | Status: DC | PRN
Start: 2024-01-27 — End: 2024-01-28

## 2024-01-27 MED ORDER — MELATONIN 3 MG PO TABS
3.0000 mg | ORAL_TABLET | Freq: Every evening | ORAL | Status: DC | PRN
Start: 2024-01-27 — End: 2024-01-28

## 2024-01-27 MED ORDER — ONDANSETRON HCL 4 MG/2ML IJ SOLN: 4.0000 mg | Freq: Four times a day (QID) | INTRAMUSCULAR | Status: DC | PRN

## 2024-01-27 MED ORDER — IOHEXOL 300 MG/ML  SOLN
100.0000 mL | Freq: Once | INTRAMUSCULAR | Status: AC | PRN
  Administered 2024-01-27: 100 mL via INTRAVENOUS

## 2024-01-27 MED ORDER — HYDRALAZINE HCL 20 MG/ML IJ SOLN: 10.0000 mg | INTRAMUSCULAR | Status: DC | PRN

## 2024-01-27 MED ORDER — MORPHINE SULFATE (PF) 2 MG/ML IV SOLN: 2.0000 mg | INTRAVENOUS | Status: DC | PRN

## 2024-01-27 MED ORDER — ACETAMINOPHEN 500 MG PO TABS
1000.0000 mg | ORAL_TABLET | Freq: Four times a day (QID) | ORAL | Status: DC | PRN
Start: 2024-01-27 — End: 2024-01-28

## 2024-01-27 MED ORDER — METOPROLOL TARTRATE 5 MG/5ML IV SOLN: 5.0000 mg | Freq: Four times a day (QID) | INTRAVENOUS | Status: DC | PRN

## 2024-01-27 MED ORDER — ENOXAPARIN SODIUM 40 MG/0.4ML IJ SOSY: 40.0000 mg | PREFILLED_SYRINGE | INTRAMUSCULAR | Status: DC

## 2024-01-27 NOTE — ED Notes (Signed)
 Spoke with [facility] to arrange transportation

## 2024-01-27 NOTE — ED Notes (Addendum)
 Per ER Physician,  Patient will not need [facility] for transport.  Patient will go POV.   Called [facility] @17 :06 to cancel and spoke with [facility]

## 2024-01-27 NOTE — ED Notes (Signed)
 Pt wife is coming to get him to take him POV to [facility]. Pt is aware he is not to stop anywhere in between. Pt IV have been secured.

## 2024-01-27 NOTE — ED Provider Notes (Signed)
 [place] [facility] Provider Note  CSN: [id number] Arrival date & time: 01/27/24 9170  Chief Complaint(s) Abdominal Pain  HPI Kent Montgomery is a 69 y.o. male with past medical history as below, significant for GERD, HLD, T-cell lymphoma, diverticulosis, nephrolithiasis who presents to the ED with complaint of right side abdominal pain, n/v  Patient reports pain to his right upper quadrant intermittent over the past 2 to 3 months.  He was seen previously and was told he had sludge in his gallbladder.  He has been following a low-fat diet but his symptoms have persisted.  He has been having worsening right-sided abdominal pain, nausea and vomiting, poor p.o. intake secondary to discomfort.  Has not had a bowel movement in the past couple days.  When he attempts to eat something makes him feel nauseated.  Pain describes as sharp, cramping sensation right-sided.  No fever, no recent travel or sick contacts, no change to urination  Past Medical History Past Medical History:  Diagnosis Date   Allergy    Blood transfusion without reported diagnosis    Colon polyp    Elevated transaminase level    GERD (gastroesophageal reflux disease)    History of kidney stones    Hyperlipidemia    Lower GI bleed    Sigmoid diverticulosis    Skin cancer    basil cell rt arm   Symptomatic anemia    Patient Active Problem List   Diagnosis Date Noted   SBO (small bowel obstruction) (HCC) 01/27/2024   Agatston coronary artery calcium score of 29,  2022 02/13/2022   Nephrolithiasis 02/13/2022   History of basal cell carcinoma (BCC) of skin 02/12/2022   B12 deficiency 02/12/2022   Hyperglycemia 02/12/2022   History of GI bleed 01/26/2016   Sigmoid diverticulosis 01/26/2016   History of colonic polyps 01/26/2016   Hyperlipidemia 01/26/2016   Home Medication(s) Prior to Admission medications   Medication Sig Start Date End Date Taking? Authorizing Provider   Coenzyme Q10 (COQ10 PO) Take 1 tablet by mouth daily.    [provider]  cyanocobalamin  (VITAMIN B12) 1000 MCG tablet Take 1,000 mcg by mouth daily.    [provider]  MEGARED OMEGA-3 KRILL OIL PO Take 1 capsule by mouth daily.    [provider]  Multiple Vitamin (MULTIVITAMIN WITH MINERALS) TABS tablet Take 1 tablet by mouth daily.    [provider]  Probiotic Product (PROBIOTIC DAILY) CAPS Take 1 capsule by mouth daily.    [provider]  PSYLLIUM HUSK PO Take 5-6 capsules by mouth 2 (two) times daily.     [provider]  TURMERIC PO Take 2 tablets by mouth daily.    [provider]  Past Surgical History Past Surgical History:  Procedure Laterality Date   APPENDECTOMY     ASPIRATION BIOPSY     of eye cyst   BACK SURGERY     BACK SURGERY     COLONOSCOPY N/A 01/28/2016   Procedure: COLONOSCOPY;  Surgeon: Elspeth Deward Naval, MD;  Location: [facility] ENDOSCOPY;  Service: Gastroenterology;  Laterality: N/A;   COLONOSCOPY  2015   ELBOW ARTHROSCOPY     ESOPHAGOGASTRODUODENOSCOPY N/A 01/28/2016   Procedure: ESOPHAGOGASTRODUODENOSCOPY (EGD);  Surgeon: Elspeth Deward Naval, MD;  Location: [facility] ENDOSCOPY;  Service: Gastroenterology;  Laterality: N/A;   GIVENS CAPSULE STUDY N/A 01/30/2016   Procedure: GIVENS CAPSULE STUDY;  Surgeon: Lupita FORBES Commander, MD;  Location: [facility] ENDOSCOPY;  Service: Endoscopy;  Laterality: N/A;   MASS EXCISION Right 01/22/2018   Procedure: EXCISION OF 10 CM MASS ON RIGHT UPPER BACK ERAS PATHWAY;  Surgeon: Gail Favorite, MD;  Location: [facility] ORS;  Service: General;  Laterality: Right;   SMALL BOWEL ENTEROSCOPY N/A 02/2016   double balloon   Family History Family History  Problem Relation Age of Onset   Cancer Maternal Grandfather        bladder   Prostate cancer Father    Colon polyps  Father    Heart disease Father    Nephrolithiasis Father    Stomach cancer Father    Liver cancer Father    Colon cancer Neg Hx    Esophageal cancer Neg Hx    Rectal cancer Neg Hx     Social History Social History   Tobacco Use   Smoking status: Never   Smokeless tobacco: Never  Vaping Use   Vaping status: Never Used  Substance Use Topics   Alcohol  use: Yes    Alcohol /week: 10.0 standard drinks of alcohol     Types: 10 Glasses of wine per week    Comment: Wine   Drug use: No   Allergies Droperidol, Pravastatin, and Cefuroxime axetil  Review of Systems A thorough review of systems was obtained and all systems are negative except as noted in the HPI and PMH.   Physical Exam Vital Signs  I have reviewed the triage vital signs BP 135/80 (BP Location: Right Arm)   Pulse 70   Temp 98.8 F (37.1 C) (Oral)   Resp 16   Ht 5' 10 (1.778 m)   Wt 97.1 kg   SpO2 100%   BMI 30.71 kg/m  Physical Exam Vitals and nursing note reviewed.  Constitutional:      General: He is not in acute distress.    Appearance: He is well-developed.  HENT:     Head: Normocephalic and atraumatic.     Right Ear: External ear normal.     Left Ear: External ear normal.     Mouth/Throat:     Mouth: Mucous membranes are moist.  Eyes:     General: No scleral icterus. Cardiovascular:     Rate and Rhythm: Normal rate and regular rhythm.     Pulses: Normal pulses.     Heart sounds: Normal heart sounds.  Pulmonary:     Effort: Pulmonary effort is normal. No respiratory distress.     Breath sounds: Normal breath sounds.  Abdominal:     General: Abdomen is flat.     Palpations: Abdomen is soft.     Tenderness: There is abdominal tenderness in the right upper quadrant and right lower quadrant.  Musculoskeletal:     Cervical back: No rigidity.     Right lower leg:  No edema.     Left lower leg: No edema.  Skin:    General: Skin is warm and dry.     Capillary Refill: Capillary refill takes less  than 2 seconds.  Neurological:     Mental Status: He is alert.  Psychiatric:        Mood and Affect: Mood normal.        Behavior: Behavior normal.     ED Results and Treatments Labs (all labs ordered are listed, but only abnormal results are displayed) Labs Reviewed  COMPREHENSIVE METABOLIC PANEL WITH GFR - Abnormal; Notable for the following components:      Result Value   Glucose, Bld 116 (*)    ALT 59 (*)    Alkaline Phosphatase 142 (*)    All other components within normal limits  CBC - Abnormal; Notable for the following components:   RBC 6.03 (*)    All other components within normal limits  URINALYSIS, ROUTINE W REFLEX MICROSCOPIC - Abnormal; Notable for the following components:   Bilirubin Urine SMALL (*)    Ketones, ur >=80 (*)    Protein, ur 100 (*)    All other components within normal limits  URINALYSIS, MICROSCOPIC (REFLEX) - Abnormal; Notable for the following components:   Bacteria, UA RARE (*)    All other components within normal limits  LIPASE, BLOOD  HIV ANTIBODY (ROUTINE TESTING W REFLEX)                                                                                                                          Radiology CT ABDOMEN PELVIS W CONTRAST Result Date: 01/27/2024 CLINICAL DATA:  Right lower quadrant abdominal pain since April. Intermittent. 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 * Tracking Code: BO * EXAM: CT ABDOMEN AND PELVIS WITH CONTRAST TECHNIQUE: Multidetector CT imaging of the abdomen and pelvis was performed using the standard protocol following bolus administration of intravenous contrast. RADIATION DOSE REDUCTION: This exam was performed according to the departmental dose-optimization program which includes automated exposure control, adjustment of the mA and/or kV according to patient size and/or use of iterative reconstruction technique. CONTRAST:  100mL OMNIPAQUE  IOHEXOL  300 MG/ML  SOLN COMPARISON:  MRI of  11/24/2023. PET of 05/16/2023. 10/24/2021 abdominopelvic CT. FINDINGS: Lower chest: Left lower lobe calcified granulomas. Normal heart size without pericardial or pleural effusion. Hepatobiliary: Normal liver. Normal gallbladder, without biliary ductal dilatation. Pancreas: Pancreatic head subcentimeter cystic lesion was detailed on 11/24/2023 MRI. No duct dilatation or acute inflammation. Spleen: Normal in size, without focal abnormality. Adrenals/Urinary Tract: Normal adrenal glands. Normal kidneys, without hydronephrosis. Normal urinary bladder. Stomach/Bowel: Normal stomach, without wall thickening. Scattered colonic diverticula. Normal terminal ileum. Appendectomy per report. Mid small bowel dilatation is significant up to 5.5 cm on 36/2. Followed to the level of an obstructive enhancing small bowel mass of 2.4 x 2.0 cm on 35/2. No complicating ischemia. Vascular/Lymphatic: Aortic atherosclerosis. Adjacent to the enhancing small bowel mass  is a small bowel mesenteric enhancing lesion of 2.6 x 2.3 cm on 42/2. Mesenteric retraction and smaller surrounding mesenteric nodes. No pelvic sidewall adenopathy. Reproductive: Mild prostatomegaly. Other: Tiny fat containing left inguinal hernia. No significant free fluid. No evidence of omental or peritoneal disease. Musculoskeletal: Lumbosacral spondylosis. IMPRESSION: 1. 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 High-grade partial obstruction secondary to the small bowel primary. 2. Incidental findings, including: Aortic Atherosclerosis (ICD10-I70.0). Prostatomegaly. Electronically Signed   By: Rockey Kilts M.D.   On: 01/27/2024 12:31    Pertinent labs & imaging results that were available during my care of the patient were reviewed by me and considered in my medical decision making (see MDM for details).  Medications Ordered in ED Medications  enoxaparin  (LOVENOX ) injection 40 mg (has no administration in time  range)  dextrose  5 % and 0.45 % NaCl with KCl 20 mEq/L infusion (has no administration in time range)  morphine  (PF) 2 MG/ML injection 2 mg (has no administration in time range)  acetaminophen  (TYLENOL ) tablet 1,000 mg (has no administration in time range)  melatonin tablet 3 mg (has no administration in time range)  diphenhydrAMINE  (BENADRYL ) 12.5 MG/5ML elixir 12.5 mg (has no administration in time range)    Or  diphenhydrAMINE  (BENADRYL ) injection 12.5 mg (has no administration in time range)  ondansetron  (ZOFRAN -ODT) disintegrating tablet 4 mg (has no administration in time range)    Or  ondansetron  (ZOFRAN ) injection 4 mg (has no administration in time range)  simethicone  (MYLICON) chewable tablet 40 mg (has no administration in time range)  hydrALAZINE  (APRESOLINE ) injection 10 mg (has no administration in time range)  metoprolol  tartrate (LOPRESSOR ) injection 5 mg (has no administration in time range)  iohexol  (OMNIPAQUE ) 300 MG/ML solution 100 mL (100 mLs Intravenous Contrast Given 01/27/24 1105)                                                                                                                                     Procedures Procedures  (including critical care time)  Medical Decision Making / ED Course    Medical Decision Making:    Jermany Rimel is a 68 y.o. male with past medical history as below, significant for GERD, HLD, T-cell lymphoma, diverticulosis, nephrolithiasis who presents to the ED with complaint of right side abdominal pain, n/v. The complaint involves an extensive differential diagnosis and also carries with it a high risk of complications and morbidity.  Serious etiology was considered. Ddx includes but is not limited to: Differential diagnosis includes but is not exclusive to acute appendicitis, biliary colic, renal colic, testicular torsion, urinary tract infection, prostatitis,  diverticulitis, small bowel obstruction, colitis, abdominal aortic  aneurysm, gastroenteritis, constipation etc.   Complete initial physical exam performed, notably the patient was in no acute distress, resting comfortably on stretcher.    Reviewed and confirmed nursing documentation for past medical history, family history,  social history.  Vital signs reviewed.    Right side abdominal pain  Nausea, vomiting, constipation High grade partial bowel obstruction > - Abdomen soft, nonperitoneal, he has a right sided TTP right upper quadrant right lower quadrant  - He has some bilirubin in his urine and ketonuria, labs otherwise are stable  - CT is concerning for high grade partial bowel obstruction possibly secondary to small bowel mass without evidence of ischemia per radiology, possible carcinoid per radiology  - Consult general surgery who will take pt as primary, recommend admit [facility]  - Discussed findings and plan with patient and family at bedside who are agreeable.                      Additional history obtained: -Additional history obtained from family -External records from outside source obtained and reviewed including: Chart review including previous notes, labs, imaging, consultation notes including  Prior ER evaluation   Lab Tests: -I ordered, reviewed, and interpreted labs.   The pertinent results include:   Labs Reviewed  COMPREHENSIVE METABOLIC PANEL WITH GFR - Abnormal; Notable for the following components:      Result Value   Glucose, Bld 116 (*)    ALT 59 (*)    Alkaline Phosphatase 142 (*)    All other components within normal limits  CBC - Abnormal; Notable for the following components:   RBC 6.03 (*)    All other components within normal limits  URINALYSIS, ROUTINE W REFLEX MICROSCOPIC - Abnormal; Notable for the following components:   Bilirubin Urine SMALL (*)    Ketones, ur >=80 (*)    Protein, ur 100 (*)    All other components within normal limits  URINALYSIS, MICROSCOPIC (REFLEX) - Abnormal;  Notable for the following components:   Bacteria, UA RARE (*)    All other components within normal limits  LIPASE, BLOOD  HIV ANTIBODY (ROUTINE TESTING W REFLEX)    Notable for as above  EKG   EKG Interpretation Date/Time:    Ventricular Rate:    PR Interval:    QRS Duration:    QT Interval:    QTC Calculation:   R Axis:      Text Interpretation:           Imaging Studies ordered: I ordered imaging studies including ctap I independently visualized the following imaging with scope of interpretation limited to determining acute life threatening conditions related to emergency care; findings noted above I agree with the radiologist interpretation If any imaging was obtained with contrast I closely monitored patient for any possible adverse reaction a/w contrast administration in the emergency department   Medicines ordered and prescription drug management: Meds ordered this encounter  Medications   iohexol  (OMNIPAQUE ) 300 MG/ML solution 100 mL   enoxaparin  (LOVENOX ) injection 40 mg   dextrose  5 % and 0.45 % NaCl with KCl 20 mEq/L infusion   morphine  (PF) 2 MG/ML injection 2 mg   acetaminophen  (TYLENOL ) tablet 1,000 mg   melatonin tablet 3 mg   OR Linked Order Group    diphenhydrAMINE  (BENADRYL ) 12.5 MG/5ML elixir 12.5 mg    diphenhydrAMINE  (BENADRYL ) injection 12.5 mg   OR Linked Order Group    ondansetron  (ZOFRAN -ODT) disintegrating tablet 4 mg    ondansetron  (ZOFRAN ) injection 4 mg   simethicone  (MYLICON) chewable tablet 40 mg   hydrALAZINE  (APRESOLINE ) injection 10 mg   metoprolol  tartrate (LOPRESSOR ) injection 5 mg    -I have reviewed the patients home  medicines and have made adjustments as needed   Consultations Obtained: I requested consultation with the gen surg,  and discussed lab and imaging findings as well as pertinent plan - they recommend: admit   Cardiac Monitoring: Continuous pulse oximetry interpreted by myself, 100% on ra.    Social  Determinants of Health:  Diagnosis or treatment significantly limited by social determinants of health: obesity   Reevaluation: After the interventions noted above, I reevaluated the patient and found that they have improved  Co morbidities that complicate the patient evaluation  Past Medical History:  Diagnosis Date   Allergy    Blood transfusion without reported diagnosis    Colon polyp    Elevated transaminase level    GERD (gastroesophageal reflux disease)    History of kidney stones    Hyperlipidemia    Lower GI bleed    Sigmoid diverticulosis    Skin cancer    basil cell rt arm   Symptomatic anemia       Dispostion: Disposition decision including need for hospitalization was considered, and patient admitted to the hospital.    Final Clinical Impression(s) / ED Diagnoses Final diagnoses:  SBO (small bowel obstruction) (HCC)        Elnor Jayson LABOR, DO 01/27/24 1559

## 2024-01-27 NOTE — H&P (Signed)
 Kent Montgomery 06-Oct-1955  [id number].     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  The patient has been having some right-sided abdominal with some N/V after eating for several months.  He has been seen by GI who ordered an US  that revealed no stones or other gallbladder abnormalities.  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  He then underwent a HIDA scan that was normal with a normal EF.  He has been following a low fat diet which seems to have not helped his symptoms.  He has been having anorexia at home along with nausea.  .  Due to persistent symptoms, he present for evaluation in the ED.  He underwent a CT scan that reveals a small bowel obstruction secondary to a small bowel mass with a node in the mesentery.  This is concerning for a carcinoid with a nodal met.  His cbc is unremarkable, ALT 59 and AlPh 142.  Otherwise no other significant abnormalities.  We have been asked to see him for further evaluation and recommendations. He has had a small bowel movement since arrival.  Currently, he has no abdominal discomfort and is not nauseated.  He has had some emesis recently.  His previous surgery was an open appendectomy which was fairly significant with likely perforation.  He did have a PET scan back in November of last year which was unremarkable for metastatic disease from his scalp lymphoma.  ROS: ROS: see HPI  Family History  Problem Relation Age of Onset   Cancer Maternal Grandfather        bladder   Prostate cancer Father    Colon polyps Father    Heart disease Father    Nephrolithiasis Father     Stomach cancer Father    Liver cancer Father    Colon cancer Neg Hx    Esophageal cancer Neg Hx    Rectal cancer Neg Hx     Past Medical History:  Diagnosis Date   Allergy    Blood transfusion without reported diagnosis    Colon polyp    Elevated transaminase level    GERD (gastroesophageal reflux disease)    History of kidney stones    Hyperlipidemia    Lower GI bleed    Sigmoid diverticulosis    Skin cancer    basil cell rt arm   Symptomatic anemia     Past Surgical History:  Procedure Laterality Date   APPENDECTOMY     ASPIRATION BIOPSY     of eye cyst   BACK SURGERY     BACK SURGERY     COLONOSCOPY N/A 01/28/2016   Procedure: COLONOSCOPY;  Surgeon: Elspeth Deward Naval, MD;  Location: [facility] ENDOSCOPY;  Service: Gastroenterology;  Laterality: N/A;   COLONOSCOPY  2015   ELBOW ARTHROSCOPY     ESOPHAGOGASTRODUODENOSCOPY N/A 01/28/2016   Procedure: ESOPHAGOGASTRODUODENOSCOPY (EGD);  Surgeon: Elspeth Deward Naval, MD;  Location: [facility] ENDOSCOPY;  Service: Gastroenterology;  Laterality: N/A;   GIVENS CAPSULE STUDY N/A 01/30/2016   Procedure: GIVENS CAPSULE STUDY;  Surgeon: Lupita FORBES Commander, MD;  Location:  [facility] ENDOSCOPY;  Service: Endoscopy;  Laterality: N/A;   MASS EXCISION Right 01/22/2018   Procedure: EXCISION OF 10 CM MASS ON RIGHT UPPER BACK ERAS PATHWAY;  Surgeon: Gail Favorite, MD;  Location: [facility] ORS;  Service: General;  Laterality: Right;   SMALL BOWEL ENTEROSCOPY N/A 02/2016   double balloon    Social History:  reports that he has never smoked. He has never used smokeless tobacco. He reports current alcohol  use of about 10.0 standard drinks of alcohol  per week. He reports that he does not use drugs.  Allergies:  Allergies  Allergen Reactions   Droperidol Anaphylaxis   Pravastatin Other (See Comments)    Joint pain, elevated LFTs   Cefuroxime Axetil Rash    (Not in a hospital admission)    Physical Exam: Blood pressure 135/80, pulse 70, temperature 98.8 F (37.1  C), temperature source Oral, resp. rate 16, height 5' 10 (1.778 m), weight 97.1 kg, SpO2 100%.  General: pleasant, WD, WN white male who is laying in bed in NAD HEENT: head is normocephalic, atraumatic.  Sclera are noninjected.  PERRL.  Ears and nose without any masses or lesions.  Mouth is pink and moist Heart: regular, rate, and rhythm.   Lungs: CTAB, no wheezes, rhonchi, or rales noted.  Respiratory effort nonlabored Abd: soft, nontender with minimal fullness.  There are no palpable masses.  He has a large transverse incision across his lower right abdomen MS: all 4 extremities are symmetrical with no cyanosis, clubbing, or edema. Skin: warm and dry with no masses, lesions, or rashes Neuro: Cranial nerves 2-12 grossly intact, sensation is normal throughout Psych: A&Ox3 with an appropriate affect.   Results for orders placed or performed during the hospital encounter of 01/27/24 (from the past 48 hours)  Urinalysis, Routine w reflex microscopic -Urine, Clean Catch     Status: Abnormal   Collection Time: 01/27/24  8:48 AM  Result Value Ref Range   Color, Urine YELLOW YELLOW   APPearance CLEAR CLEAR   Specific Gravity, Urine >=1.030 1.005 - 1.030   pH 5.5 5.0 - 8.0   Glucose, UA NEGATIVE NEGATIVE mg/dL   Hgb urine dipstick NEGATIVE NEGATIVE   Bilirubin Urine SMALL (A) NEGATIVE   Ketones, ur >=80 (A) NEGATIVE mg/dL   Protein, ur 899 (A) NEGATIVE mg/dL   Nitrite NEGATIVE NEGATIVE   Leukocytes,Ua NEGATIVE NEGATIVE    Comment: Performed at [facility], [address] [place], [place] [address]  Urinalysis, Microscopic (reflex)     Status: Abnormal   Collection Time: 01/27/24  8:48 AM  Result Value Ref Range   RBC / HPF 0-5 0 - 5 RBC/hpf   WBC, UA 0-5 0 - 5 WBC/hpf   Bacteria, UA RARE (A) NONE SEEN   Squamous Epithelial / HPF 0-5 0 - 5 /HPF   Mucus PRESENT    Hyaline Casts, UA PRESENT     Comment: Performed at [facility], [address] [place],  [place] [address]  Lipase, blood     Status: None   Collection Time: 01/27/24  8:53 AM  Result Value Ref Range   Lipase 44 11 - 51 U/L    Comment: Performed at [facility], [address] [place], [place] [address]  Comprehensive metabolic panel     Status: Abnormal   Collection Time: 01/27/24  8:53 AM  Result Value Ref Range   Sodium 137 135 - 145 mmol/L   Potassium 3.9 3.5 -  5.1 mmol/L   Chloride 102 98 - 111 mmol/L   CO2 22 22 - 32 mmol/L   Glucose, Bld 116 (H) 70 - 99 mg/dL    Comment: Glucose reference range applies only to samples taken after fasting for at least 8 hours.   BUN 12 8 - 23 mg/dL   Creatinine, Ser 8.97 0.61 - 1.24 mg/dL   Calcium 8.9 8.9 - 89.6 mg/dL   Total Protein 7.1 6.5 - 8.1 g/dL   Albumin 4.6 3.5 - 5.0 g/dL   AST 31 15 - 41 U/L   ALT 59 (H) 0 - 44 U/L   Alkaline Phosphatase 142 (H) 38 - 126 U/L   Total Bilirubin 1.1 0.0 - 1.2 mg/dL   GFR, Estimated >39 >39 mL/min    Comment: (NOTE) Calculated using the CKD-EPI Creatinine Equation (2021)    Anion gap 14 5 - 15    Comment: Performed at [facility], [address] [place], [place] [address]  CBC     Status: Abnormal   Collection Time: 01/27/24  8:53 AM  Result Value Ref Range   WBC 6.7 4.0 - 10.5 K/uL   RBC 6.03 (H) 4.22 - 5.81 MIL/uL   Hemoglobin 16.9 13.0 - 17.0 g/dL   HCT 50.7 60.9 - 47.9 %   MCV 81.6 80.0 - 100.0 fL   MCH 28.0 26.0 - 34.0 pg   MCHC 34.3 30.0 - 36.0 g/dL   RDW 85.0 88.4 - 84.4 %   Platelets 218 150 - 400 K/uL   nRBC 0.0 0.0 - 0.2 %    Comment: Performed at [facility], [address] [place], [place] [address]   CT ABDOMEN PELVIS W CONTRAST Result Date: 01/27/2024 CLINICAL DATA:  Right lower quadrant abdominal pain since April. Intermittent. 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 * Tracking Code: BO * EXAM: CT ABDOMEN AND PELVIS WITH CONTRAST TECHNIQUE: Multidetector CT imaging of the abdomen and pelvis was performed  using the standard protocol following bolus administration of intravenous contrast. RADIATION DOSE REDUCTION: This exam was performed according to the departmental dose-optimization program which includes automated exposure control, adjustment of the mA and/or kV according to patient size and/or use of iterative reconstruction technique. CONTRAST:  OMNIPAQUE  IOHEXOL  300 MG/ML  SOLN COMPARISON:  MRI of 11/24/2023. PET of 05/16/2023. 10/24/2021 abdominopelvic CT. FINDINGS: Lower chest: Left lower lobe calcified granulomas. Normal heart size without pericardial or pleural effusion. Hepatobiliary: Normal liver. Normal gallbladder, without biliary ductal dilatation. Pancreas: Pancreatic head subcentimeter cystic lesion was detailed on 11/24/2023 MRI. No duct dilatation or acute inflammation. Spleen: Normal in size, without focal abnormality. Adrenals/Urinary Tract: Normal adrenal glands. Normal kidneys, without hydronephrosis. Normal urinary bladder. Stomach/Bowel: Normal stomach, without wall thickening. Scattered colonic diverticula. Normal terminal ileum. Appendectomy per report. Mid small bowel dilatation is significant up to 5.5 cm on 36/2. Followed to the level of an obstructive enhancing small bowel mass of 2.4 x 2.0 cm on 35/2. No complicating ischemia. Vascular/Lymphatic: Aortic atherosclerosis. Adjacent to the enhancing small bowel mass is a small bowel mesenteric enhancing lesion of 2.6 x 2.3 cm on 42/2. Mesenteric retraction and smaller surrounding mesenteric nodes. No pelvic sidewall adenopathy. Reproductive: Mild prostatomegaly. Other: Tiny fat containing left inguinal hernia. No significant free fluid. No evidence of omental or peritoneal disease. Musculoskeletal: Lumbosacral spondylosis. IMPRESSION: 1. 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 High-grade partial obstruction secondary to the small bowel  primary. 2. Incidental findings, including:  Aortic Atherosclerosis (ICD10-I70.0). Prostatomegaly. Electronically Signed   By: Rockey Kilts M.D.   On: 01/27/2024 12:31      Assessment/Plan SBO secondary to small bowel mass with an enlarged mesenteric node  I reviewed his CT scan of the abdomen and pelvis and have discussed the findings with the patient and his wife. He appears to have a bowel obstruction secondary to a small bowel mass.  This is almost certainly the etiology of abdominal pain that he has been having for the last several months.  For now, he will remain NPO x ice chips, pain meds as needed.  Will need to determine plan for surgery, likely for resection of the small bowel mass, but unclear plans for this node given its location and tethering, etc. I have also ordered a CEA.    FEN - NPO x Ice/IVFs VTE - lovenox  ID - none currently needed Admit - inpatient  HLD Cutaneous B-cell lymphoma of scalp   Vicenta Poli, MD [facility] 01/27/2024

## 2024-01-27 NOTE — ED Notes (Signed)
 Called [facility] to verify they had pick up for Rm 09.  Per [facility] should be next for pick up.

## 2024-01-27 NOTE — ED Notes (Signed)
Called report to floor RN. 

## 2024-01-27 NOTE — ED Notes (Signed)
 Pt and wife have just left and are in route to [employer]. 3E called and made aware of this.

## 2024-01-27 NOTE — ED Triage Notes (Signed)
 C/o right upper abdominal pain with N/V & constipation. States had sludge in his gallbladder in May, seen in ED, and to come back to be seen if it gets worse.

## 2024-01-28 ENCOUNTER — Encounter (HOSPITAL_COMMUNITY): Payer: Self-pay

## 2024-01-28 ENCOUNTER — Inpatient Hospital Stay (HOSPITAL_COMMUNITY): Admitting: Certified Registered Nurse Anesthetist

## 2024-01-28 ENCOUNTER — Other Ambulatory Visit: Payer: Self-pay | Admitting: Oncology

## 2024-01-28 ENCOUNTER — Encounter (HOSPITAL_COMMUNITY): Admission: EM | Disposition: A | Discharge status: Home / Self Care | Payer: Self-pay | Source: Home / Self Care

## 2024-01-28 DIAGNOSIS — C7A019 Malignant carcinoid tumor of the small intestine, unspecified portion: Secondary | ICD-10-CM

## 2024-01-28 DIAGNOSIS — C772 Secondary and unspecified malignant neoplasm of intra-abdominal lymph nodes: Secondary | ICD-10-CM

## 2024-01-28 HISTORY — PX: LAPAROTOMY: SHX154

## 2024-01-28 LAB — CBC
HCT: 47.3 % (ref 39.0–52.0)
Hemoglobin: 15.3 g/dL (ref 13.0–17.0)
MCH: 27.4 pg (ref 26.0–34.0)
MCHC: 32.3 g/dL (ref 30.0–36.0)
MCV: 84.6 fL (ref 80.0–100.0)
Platelets: 185 K/uL (ref 150–400)
RBC: 5.59 MIL/uL (ref 4.22–5.81)
RDW: 14.8 % (ref 11.5–15.5)
WBC: 4 K/uL (ref 4.0–10.5)
nRBC: 0 % (ref 0.0–0.2)

## 2024-01-28 LAB — BASIC METABOLIC PANEL WITH GFR
Anion gap: 9 (ref 5–15)
BUN: 13 mg/dL (ref 8–23)
CO2: 22 mmol/L (ref 22–32)
Calcium: 7.9 mg/dL — ABNORMAL LOW (ref 8.9–10.3)
Chloride: 101 mmol/L (ref 98–111)
Creatinine, Ser: 1.03 mg/dL (ref 0.61–1.24)
GFR, Estimated: >60 mL/min (ref >60)
Glucose, Bld: 103 mg/dL — ABNORMAL HIGH (ref 70–99)
Potassium: 3.4 mmol/L — ABNORMAL LOW (ref 3.5–5.1)
Sodium: 132 mmol/L — ABNORMAL LOW (ref 135–145)

## 2024-01-28 SURGERY — LAPAROTOMY, EXPLORATORY
Anesthesia: General

## 2024-01-28 MED ORDER — LIDOCAINE HCL (PF) 2 % IJ SOLN
INTRAMUSCULAR | Status: AC
  Filled 2024-01-28: qty 5

## 2024-01-28 MED ORDER — MIDAZOLAM HCL 2 MG/2ML IJ SOLN
INTRAMUSCULAR | Status: AC
  Filled 2024-01-28: qty 2

## 2024-01-28 MED ORDER — ONDANSETRON 4 MG PO TBDP: 4.0000 mg | ORAL_TABLET | Freq: Four times a day (QID) | ORAL | Status: DC | PRN

## 2024-01-28 MED ORDER — FENTANYL CITRATE (PF) 100 MCG/2ML IJ SOLN
INTRAMUSCULAR | Status: AC
  Filled 2024-01-28: qty 2

## 2024-01-28 MED ORDER — HYDROMORPHONE HCL 1 MG/ML IJ SOLN
INTRAMUSCULAR | Status: AC
  Filled 2024-01-28: qty 1

## 2024-01-28 MED ORDER — ROCURONIUM BROMIDE 100 MG/10ML IV SOLN
INTRAVENOUS | Status: DC | PRN
  Administered 2024-01-28: 20 mg via INTRAVENOUS
  Administered 2024-01-28: 10 mg via INTRAVENOUS
  Administered 2024-01-28: 50 mg via INTRAVENOUS

## 2024-01-28 MED ORDER — SODIUM CHLORIDE 0.9 % IR SOLN
Status: DC | PRN
  Administered 2024-01-28: 3000 mL

## 2024-01-28 MED ORDER — SUGAMMADEX SODIUM 200 MG/2ML IV SOLN
INTRAVENOUS | Status: AC
Start: 2024-01-28 — End: 2024-01-28
  Filled 2024-01-28: qty 2

## 2024-01-28 MED ORDER — LACTATED RINGERS IV SOLN: INTRAVENOUS | Status: DC

## 2024-01-28 MED ORDER — ONDANSETRON HCL 4 MG/2ML IJ SOLN
INTRAMUSCULAR | Status: AC
Start: 2024-01-28 — End: 2024-01-28
  Filled 2024-01-28: qty 2

## 2024-01-28 MED ORDER — FENTANYL CITRATE (PF) 100 MCG/2ML IJ SOLN
INTRAMUSCULAR | Status: DC | PRN
  Administered 2024-01-28 (×4): 50 ug via INTRAVENOUS

## 2024-01-28 MED ORDER — DEXAMETHASONE SODIUM PHOSPHATE 10 MG/ML IJ SOLN
INTRAMUSCULAR | Status: AC
  Filled 2024-01-28: qty 1

## 2024-01-28 MED ORDER — MIDAZOLAM HCL 5 MG/5ML IJ SOLN
INTRAMUSCULAR | Status: DC | PRN
  Administered 2024-01-28: 2 mg via INTRAVENOUS

## 2024-01-28 MED ORDER — HYDROMORPHONE HCL 1 MG/ML IJ SOLN: 1.0000 mg | INTRAMUSCULAR | Status: DC | PRN

## 2024-01-28 MED ORDER — ONDANSETRON HCL 4 MG/2ML IJ SOLN
4.0000 mg | Freq: Four times a day (QID) | INTRAMUSCULAR | Status: DC | PRN
  Administered 2024-01-31: 4 mg via INTRAVENOUS
  Filled 2024-01-28: qty 2

## 2024-01-28 MED ORDER — HYDROMORPHONE HCL 1 MG/ML IJ SOLN
0.2500 mg | INTRAMUSCULAR | Status: DC | PRN
  Administered 2024-01-28 (×4): 0.5 mg via INTRAVENOUS

## 2024-01-28 MED ORDER — CHLORHEXIDINE GLUCONATE CLOTH 2 % EX PADS: 6.0000 | MEDICATED_PAD | Freq: Once | CUTANEOUS | Status: DC

## 2024-01-28 MED ORDER — FENTANYL CITRATE (PF) 100 MCG/2ML IJ SOLN
INTRAMUSCULAR | Status: AC
Start: 2024-01-28 — End: 2024-01-28
  Filled 2024-01-28: qty 2

## 2024-01-28 MED ORDER — DEXAMETHASONE SODIUM PHOSPHATE 4 MG/ML IJ SOLN
INTRAMUSCULAR | Status: DC | PRN
Start: 2024-01-28 — End: 2024-01-28
  Administered 2024-01-28: 5 mg via INTRAVENOUS

## 2024-01-28 MED ORDER — HYDROMORPHONE HCL 1 MG/ML IJ SOLN
INTRAMUSCULAR | Status: AC
Start: 2024-01-28 — End: 2024-01-28
  Filled 2024-01-28: qty 1

## 2024-01-28 MED ORDER — AMISULPRIDE (ANTIEMETIC) 5 MG/2ML IV SOLN
INTRAVENOUS | Status: AC
  Filled 2024-01-28: qty 4

## 2024-01-28 MED ORDER — OXYCODONE HCL 5 MG/5ML PO SOLN: 5.0000 mg | Freq: Once | ORAL | Status: AC | PRN

## 2024-01-28 MED ORDER — SUGAMMADEX SODIUM 200 MG/2ML IV SOLN
INTRAVENOUS | Status: DC | PRN
  Administered 2024-01-28: 200 mg via INTRAVENOUS

## 2024-01-28 MED ORDER — KETOROLAC TROMETHAMINE 30 MG/ML IJ SOLN
30.0000 mg | Freq: Once | INTRAMUSCULAR | Status: AC
  Administered 2024-01-28: 30 mg via INTRAVENOUS

## 2024-01-28 MED ORDER — HYDROMORPHONE HCL 1 MG/ML IJ SOLN
INTRAMUSCULAR | Status: DC | PRN
  Administered 2024-01-28: 1 mg via INTRAVENOUS

## 2024-01-28 MED ORDER — KCL IN DEXTROSE-NACL 20-5-0.45 MEQ/L-%-% IV SOLN
INTRAVENOUS | Status: AC
  Filled 2024-01-28: qty 1000

## 2024-01-28 MED ORDER — PANTOPRAZOLE SODIUM 40 MG IV SOLR
40.0000 mg | Freq: Every day | INTRAVENOUS | Status: DC
  Administered 2024-01-28 – 2024-01-31 (×4): 40 mg via INTRAVENOUS
  Filled 2024-01-28 (×4): qty 10

## 2024-01-28 MED ORDER — NALOXONE HCL 0.4 MG/ML IJ SOLN: 0.4000 mg | INTRAMUSCULAR | Status: DC | PRN

## 2024-01-28 MED ORDER — ACETAMINOPHEN 10 MG/ML IV SOLN
INTRAVENOUS | Status: AC
  Filled 2024-01-28: qty 100

## 2024-01-28 MED ORDER — SODIUM CHLORIDE 0.9 % IV SOLN
1.0000 g | INTRAVENOUS | Status: AC
  Administered 2024-01-28: 1 g via INTRAVENOUS
  Filled 2024-01-28: qty 1000

## 2024-01-28 MED ORDER — HYDROMORPHONE HCL 1 MG/ML IJ SOLN
0.2500 mg | INTRAMUSCULAR | Status: DC | PRN
  Administered 2024-01-28: 0.25 mg via INTRAVENOUS

## 2024-01-28 MED ORDER — PROPOFOL 10 MG/ML IV BOLUS
INTRAVENOUS | Status: AC
  Filled 2024-01-28: qty 20

## 2024-01-28 MED ORDER — HYDROMORPHONE 1 MG/ML IV SOLN
INTRAVENOUS | Status: DC
  Administered 2024-01-28: 2 mg via INTRAVENOUS
  Administered 2024-01-28: 1.6 mg via INTRAVENOUS
  Administered 2024-01-28: 30 mg via INTRAVENOUS
  Administered 2024-01-29 (×2): 1.2 mg via INTRAVENOUS
  Administered 2024-01-29: 1.4 mg via INTRAVENOUS
  Administered 2024-01-30 (×2): 1.2 mg via INTRAVENOUS
  Administered 2024-01-30: 1.4 mg via INTRAVENOUS
  Administered 2024-01-30: 0.8 mg via INTRAVENOUS
  Administered 2024-01-30: 1.6 mg via INTRAVENOUS
  Administered 2024-01-31: 0.3 mg via INTRAVENOUS
  Administered 2024-01-31: 0.2 mg via INTRAVENOUS
  Filled 2024-01-28: qty 30

## 2024-01-28 MED ORDER — ACETAMINOPHEN 10 MG/ML IV SOLN
1000.0000 mg | Freq: Once | INTRAVENOUS | Status: DC | PRN
  Administered 2024-01-28: 1000 mg via INTRAVENOUS

## 2024-01-28 MED ORDER — HYDROMORPHONE HCL 2 MG/ML IJ SOLN
INTRAMUSCULAR | Status: AC
Start: 2024-01-28 — End: 2024-01-28
  Filled 2024-01-28: qty 1

## 2024-01-28 MED ORDER — PROPOFOL 10 MG/ML IV BOLUS
INTRAVENOUS | Status: DC | PRN
  Administered 2024-01-28: 160 mg via INTRAVENOUS

## 2024-01-28 MED ORDER — CHLORHEXIDINE GLUCONATE 0.12 % MT SOLN
15.0000 mL | Freq: Once | OROMUCOSAL | Status: AC
  Administered 2024-01-28: 15 mL via OROMUCOSAL

## 2024-01-28 MED ORDER — KETOROLAC TROMETHAMINE 30 MG/ML IJ SOLN
INTRAMUSCULAR | Status: AC
  Filled 2024-01-28: qty 1

## 2024-01-28 MED ORDER — ONDANSETRON HCL 4 MG/2ML IJ SOLN
INTRAMUSCULAR | Status: DC | PRN
  Administered 2024-01-28: 4 mg via INTRAVENOUS

## 2024-01-28 MED ORDER — MEPERIDINE HCL 25 MG/ML IJ SOLN: 6.2500 mg | INTRAMUSCULAR | Status: DC | PRN

## 2024-01-28 MED ORDER — SODIUM CHLORIDE 0.9% FLUSH: 9.0000 mL | INTRAVENOUS | Status: DC | PRN

## 2024-01-28 MED ORDER — OXYCODONE HCL 5 MG PO TABS
5.0000 mg | ORAL_TABLET | Freq: Once | ORAL | Status: AC | PRN
  Administered 2024-01-28: 5 mg via ORAL

## 2024-01-28 MED ORDER — LIDOCAINE HCL (CARDIAC) PF 100 MG/5ML IV SOSY
PREFILLED_SYRINGE | INTRAVENOUS | Status: DC | PRN
  Administered 2024-01-28: 75 mg via INTRAVENOUS

## 2024-01-28 MED ORDER — LACTATED RINGERS IV SOLN: INTRAVENOUS | Status: DC | PRN

## 2024-01-28 MED ORDER — OXYCODONE HCL 5 MG PO TABS
ORAL_TABLET | ORAL | Status: AC
Start: 2024-01-28 — End: 2024-01-28
  Filled 2024-01-28: qty 1

## 2024-01-28 MED ORDER — ROCURONIUM BROMIDE 10 MG/ML (PF) SYRINGE
PREFILLED_SYRINGE | INTRAVENOUS | Status: AC
  Filled 2024-01-28: qty 10

## 2024-01-28 MED ORDER — AMISULPRIDE (ANTIEMETIC) 5 MG/2ML IV SOLN
10.0000 mg | Freq: Once | INTRAVENOUS | Status: AC
  Administered 2024-01-28: 10 mg via INTRAVENOUS

## 2024-01-28 SURGICAL SUPPLY — 35 items
APPLICATOR COTTON TIP 6 STRL (MISCELLANEOUS) ×1 IMPLANT
BAG COUNTER SPONGE SURGICOUNT (BAG) IMPLANT
BLADE EXTENDED COATED 6.5IN (ELECTRODE) IMPLANT
BLADE HEX COATED 2.75 (ELECTRODE) ×1 IMPLANT
CHLORAPREP W/TINT 26 (MISCELLANEOUS) ×1 IMPLANT
CNTNR URN SCR LID CUP LEK RST (MISCELLANEOUS) IMPLANT
COVER MAYO STAND STRL (DRAPES) IMPLANT
COVER SURGICAL LIGHT HANDLE (MISCELLANEOUS) ×1 IMPLANT
DRAPE LAPAROSCOPIC ABDOMINAL (DRAPES) ×1 IMPLANT
DRAPE WARM FLUID 44X44 (DRAPES) IMPLANT
DRSG OPSITE POSTOP 4X8 (GAUZE/BANDAGES/DRESSINGS) IMPLANT
ELECT REM PT RETURN 15FT ADLT (MISCELLANEOUS) ×1 IMPLANT
GAUZE SPONGE 4X4 12PLY STRL (GAUZE/BANDAGES/DRESSINGS) ×1 IMPLANT
GLOVE SURG ORTHO 8.0 STRL STRW (GLOVE) ×1 IMPLANT
GOWN STRL REUS W/ TWL XL LVL3 (GOWN DISPOSABLE) ×2 IMPLANT
HANDLE SUCTION POOLE (INSTRUMENTS) IMPLANT
KIT BASIN OR (CUSTOM PROCEDURE TRAY) ×1 IMPLANT
KIT TURNOVER KIT A (KITS) ×1 IMPLANT
LIGASURE IMPACT 36 18CM CVD LR (INSTRUMENTS) IMPLANT
NS IRRIG 1000ML POUR BTL (IV SOLUTION) ×1 IMPLANT
PACK GENERAL/GYN (CUSTOM PROCEDURE TRAY) ×1 IMPLANT
RELOAD STAPLE 75 3.8 BLU REG (ENDOMECHANICALS) IMPLANT
SPONGE T-LAP 18X18 ~~LOC~~+RFID (SPONGE) IMPLANT
STAPLER GUN LINEAR PROX 60 (STAPLE) IMPLANT
STAPLER PROXIMATE 75MM BLUE (STAPLE) IMPLANT
STAPLER SKIN PROX 35W (STAPLE) ×1 IMPLANT
SUT NOV 1 T60/GS (SUTURE) IMPLANT
SUT SILK 2 0 SH CR/8 (SUTURE) IMPLANT
SUT SILK 2-0 18XBRD TIE 12 (SUTURE) IMPLANT
SUT SILK 3 0 SH CR/8 (SUTURE) IMPLANT
SUT SILK 3-0 18XBRD TIE 12 (SUTURE) IMPLANT
TOWEL OR 17X26 10 PK STRL BLUE (TOWEL DISPOSABLE) ×2 IMPLANT
TRAY FOLEY MTR SLVR 16FR STAT (SET/KITS/TRAYS/PACK) IMPLANT
WATER STERILE IRR 1000ML POUR (IV SOLUTION) ×1 IMPLANT
YANKAUER SUCT BULB TIP NO VENT (SUCTIONS) IMPLANT

## 2024-01-28 NOTE — Interval H&P Note (Signed)
 History and Physical Interval Note:  01/28/2024 11:16 AM  Kent Montgomery  has presented today for surgery, with the diagnosis of CARCINOID TUMOR WITH SMALL BOWEL OBSTRUCTION.  The various methods of treatment have been discussed with the patient and family. After consideration of risks, benefits and other options for treatment, the patient has consented to    Procedure(s) with comments: LAPAROTOMY, EXPLORATORY (N/A) - EXPLORATORY LAPAROTOMY WITH SMALL BOWEL OBSTRUCTION as a surgical intervention.    The patient's history has been reviewed, patient examined, no change in status, stable for surgery.  I have reviewed the patient's chart and labs.  Questions were answered to the patient's satisfaction.    Krystal Spinner, MD [facility] practice Office: [phone]   Krystal Spinner

## 2024-01-28 NOTE — H&P (View-Only) (Signed)
 Assessment & Plan: HD#2 - small bowel obstruction secondary to carcinoid tumor  - partial obstruction, BM overnight  - NPO, IVF  - plan OR today for small bowel resection  Patient is admitted to the surgical service with signs and symptoms of partial small bowel obstruction.  Patient has had intermittent symptoms over the past 2 to 3 months.  He has been undergoing a workup by gastroenterology, Dr. Gordy Starch.  Imaging studies from yesterday demonstrate what appears to be a carcinoid tumor involving the small bowel and causing partial obstruction.  There also appears to be nodal metastasis.  This morning we discussed the need for operative intervention.  We discussed small bowel resection.  We discussed the possibility of resecting lymph nodes with metastatic disease.  We discussed the procedure and the postoperative course to be anticipated.  The patient understands and wishes to proceed.        Krystal Spinner, MD [facility] practice Office: [phone]        Chief Complaint: Partial SBO, carcinoid tumor  Subjective: Patient in bed, comfortable, BM overnight  Objective: Vital signs in last 24 hours: Temp:  [97.4 F (36.3 C)-98.9 F (37.2 C)] 97.9 F (36.6 C) (08/06 0532) Pulse Rate:  [70-91] 77 (08/06 0532) Resp:  [15-18] 16 (08/06 0532) BP: (109-142)/(66-94) 116/70 (08/06 0532) SpO2:  [95 %-100 %] 95 % (08/06 0532) Weight:  [97.1 kg] 97.1 kg (08/05 0843) Last BM Date : 01/27/24  Intake/Output from previous day: 08/05 0701 - 08/06 0700 In: 628.6 [P.O.:120; I.V.:508.6] Out: -  Intake/Output this shift: No intake/output data recorded.  Physical Exam: HEENT - sclerae clear, mucous membranes moist Neck - soft Abdomen - soft without distension, non-tender; well healed transverse abd incision Ext - no edema, non-tender  Lab Results:  Recent Labs    01/27/24 0853 01/28/24 0444  WBC 6.7 4.0  HGB 16.9 15.3  HCT 49.2 47.3  PLT 218 185    BMET Recent Labs    01/27/24 0853 01/28/24 0444  NA 137 132*  K 3.9 3.4*  CL 102 101  CO2 22 22  GLUCOSE 116* 103*  BUN 12 13  CREATININE 1.02 1.03  CALCIUM 8.9 7.9*   PT/INR No results for input(s): LABPROT, INR in the last 72 hours. Comprehensive Metabolic Panel:    Component Value Date/Time   NA 132 (L) 01/28/2024 0444   NA 137 01/27/2024 0853   K 3.4 (L) 01/28/2024 0444   K 3.9 01/27/2024 0853   CL 101 01/28/2024 0444   CL 102 01/27/2024 0853   CO2 22 01/28/2024 0444   CO2 22 01/27/2024 0853   BUN 13 01/28/2024 0444   BUN 12 01/27/2024 0853   CREATININE 1.03 01/28/2024 0444   CREATININE 1.02 01/27/2024 0853   CREATININE 1.23 05/06/2023 1500   GLUCOSE 103 (H) 01/28/2024 0444   GLUCOSE 116 (H) 01/27/2024 0853   CALCIUM 7.9 (L) 01/28/2024 0444   CALCIUM 8.9 01/27/2024 0853   AST 31 01/27/2024 0853   AST 18 11/20/2023 1011   AST 23 05/06/2023 1500   ALT 59 (H) 01/27/2024 0853   ALT 23 11/20/2023 1011   ALT 35 05/06/2023 1500   ALKPHOS 142 (H) 01/27/2024 0853   ALKPHOS 86 11/20/2023 1011   BILITOT 1.1 01/27/2024 0853   BILITOT 0.6 11/20/2023 1011   BILITOT 0.7 05/06/2023 1500   PROT 7.1 01/27/2024 0853   PROT 6.8 11/20/2023 1011   ALBUMIN 4.6 01/27/2024 0853   ALBUMIN  4.1 11/20/2023 1011    Studies/Results: CT ABDOMEN PELVIS W CONTRAST Result Date: 01/27/2024 CLINICAL DATA:  Right lower quadrant abdominal pain since April. Intermittent. 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 * Tracking Code: BO * EXAM: CT ABDOMEN AND PELVIS WITH CONTRAST TECHNIQUE: Multidetector CT imaging of the abdomen and pelvis was performed using the standard protocol following bolus administration of intravenous contrast. RADIATION DOSE REDUCTION: This exam was performed according to the departmental dose-optimization program which includes automated exposure control, adjustment of the mA and/or kV according to patient size and/or use of iterative  reconstruction technique. CONTRAST:  OMNIPAQUE  IOHEXOL  300 MG/ML  SOLN COMPARISON:  MRI of 11/24/2023. PET of 05/16/2023. 10/24/2021 abdominopelvic CT. FINDINGS: Lower chest: Left lower lobe calcified granulomas. Normal heart size without pericardial or pleural effusion. Hepatobiliary: Normal liver. Normal gallbladder, without biliary ductal dilatation. Pancreas: Pancreatic head subcentimeter cystic lesion was detailed on 11/24/2023 MRI. No duct dilatation or acute inflammation. Spleen: Normal in size, without focal abnormality. Adrenals/Urinary Tract: Normal adrenal glands. Normal kidneys, without hydronephrosis. Normal urinary bladder. Stomach/Bowel: Normal stomach, without wall thickening. Scattered colonic diverticula. Normal terminal ileum. Appendectomy per report. Mid small bowel dilatation is significant up to 5.5 cm on 36/2. Followed to the level of an obstructive enhancing small bowel mass of 2.4 x 2.0 cm on 35/2. No complicating ischemia. Vascular/Lymphatic: Aortic atherosclerosis. Adjacent to the enhancing small bowel mass is a small bowel mesenteric enhancing lesion of 2.6 x 2.3 cm on 42/2. Mesenteric retraction and smaller surrounding mesenteric nodes. No pelvic sidewall adenopathy. Reproductive: Mild prostatomegaly. Other: Tiny fat containing left inguinal hernia. No significant free fluid. No evidence of omental or peritoneal disease. Musculoskeletal: Lumbosacral spondylosis. IMPRESSION: 1. 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 High-grade partial obstruction secondary to the small bowel primary. 2. Incidental findings, including: Aortic Atherosclerosis (ICD10-I70.0). Prostatomegaly. Electronically Signed   By: Rockey Kilts M.D.   On: 01/27/2024 12:31      Krystal Spinner 01/28/2024  Patient ID: Kent Montgomery, male   DOB: [birth year], 68 y.o.   MRN: [id number]

## 2024-01-28 NOTE — Anesthesia Preprocedure Evaluation (Addendum)
 Anesthesia Evaluation  Patient identified by MRN, date of birth, ID band Patient awake    Reviewed: Allergy & Precautions, NPO status , Patient's Chart, lab work & pertinent test results  Airway Mallampati: II  TM Distance: >3 FB   Mouth opening: Limited Mouth Opening  Dental  (+) Teeth Intact, Dental Advisory Given   Pulmonary neg pulmonary ROS   breath sounds clear to auscultation       Cardiovascular negative cardio ROS  Rhythm:Regular Rate:Normal     Neuro/Psych negative neurological ROS  negative psych ROS   GI/Hepatic Neg liver ROS,GERD  ,,  Endo/Other  negative endocrine ROS    Renal/GU Renal disease     Musculoskeletal negative musculoskeletal ROS (+)    Abdominal   Peds  Hematology  (+) Blood dyscrasia, anemia   Anesthesia Other Findings   Reproductive/Obstetrics                              Anesthesia Physical Anesthesia Plan  ASA: 2  Anesthesia Plan: General   Post-op Pain Management: Tylenol  PO (pre-op)* and Toradol  IV (intra-op)*   Induction: Intravenous  PONV Risk Score and Plan: 3 and Ondansetron , Dexamethasone  and Midazolam   Airway Management Planned: Oral ETT  Additional Equipment: None  Intra-op Plan:   Post-operative Plan: Extubation in OR  Informed Consent: I have reviewed the patients History and Physical, chart, labs and discussed the procedure including the risks, benefits and alternatives for the proposed anesthesia with the patient or authorized representative who has indicated his/her understanding and acceptance.     Dental advisory given  Plan Discussed with: CRNA  Anesthesia Plan Comments:          Anesthesia Quick Evaluation

## 2024-01-28 NOTE — Anesthesia Postprocedure Evaluation (Signed)
 Anesthesia Post Note  Patient: Kent Montgomery  Procedure(s) Performed: EXPLORATORY LAPAROTOMY, SEGMENTAL SMALL BOWEL RESECTION, RESECTION OF MESENTERIC MASS     Patient location during evaluation: PACU Anesthesia Type: General Level of consciousness: awake and alert Pain management: pain level controlled Vital Signs Assessment: post-procedure vital signs reviewed and stable Respiratory status: spontaneous breathing, nonlabored ventilation, respiratory function stable and patient connected to nasal cannula oxygen Cardiovascular status: blood pressure returned to baseline and stable Postop Assessment: no apparent nausea or vomiting Anesthetic complications: no   No notable events documented.  Last Vitals:  Vitals:   01/28/24 1608 01/28/24 1657  BP: (!) 155/88 (!) 143/90  Pulse: 90 93  Resp: 16 18  Temp: 36.6 C 36.7 C  SpO2: 98% 97%    Last Pain:  Vitals:   01/28/24 1657  TempSrc: Oral  PainSc:                  Jahi Roza D Viraj Liby

## 2024-01-28 NOTE — TOC Initial Note (Signed)
 Transition of Care [facility]) - Initial/Assessment Note    Patient Details  Name: Kent Montgomery MRN: [id number] Date of Birth: June 04, 1956  Transition [facility] CM/SW Contact:    Bascom Service, RN Phone Number: 01/28/2024, 1:23 PM  Clinical Narrative: d/c plan home. Has own transport.                  Expected Discharge Plan: Home/Self Care Barriers to Discharge: Continued Medical Work up   Patient Goals and CMS Choice Patient states their goals for this hospitalization and ongoing recovery are:: Home CMS Medicare.gov Compare Post Acute Care list provided to:: Patient Represenative (must comment) Choice offered to / list presented to : Spouse [place] ownership interest in [facility].provided to:: Spouse    Expected Discharge Plan and Services   Discharge Planning Services: CM Consult   Living arrangements for the past 2 months: Single Family Home                                      Prior Living Arrangements/Services Living arrangements for the past 2 months: Single Family Home Lives with:: Spouse                   Activities of Daily Living   ADL Screening (condition at time of admission) Independently performs ADLs?: Yes (appropriate for developmental age) Is the patient deaf or have difficulty hearing?: No Does the patient have difficulty seeing, even when wearing glasses/contacts?: No Does the patient have difficulty concentrating, remembering, or making decisions?: No  Permission Sought/Granted                  Emotional Assessment              Admission diagnosis:  SBO (small bowel obstruction) (HCC) [K56.609] Patient Active Problem List   Diagnosis Date Noted   SBO (small bowel obstruction) (HCC) 01/27/2024   Agatston coronary artery calcium score of 29,  2022 02/13/2022   Nephrolithiasis 02/13/2022   History of basal cell carcinoma (BCC) of skin 02/12/2022   B12 deficiency 02/12/2022   Hyperglycemia 02/12/2022    History of GI bleed 01/26/2016   Sigmoid diverticulosis 01/26/2016   History of colonic polyps 01/26/2016   Hyperlipidemia 01/26/2016   PCP:  Geofm Glade PARAS, MD Pharmacy:   [facility] - [facility] [address] [place] [address] Phone: [phone] Fax: [phone]  [id number] [place], [place] - [address] AT [facility] [address] [place] [address] Phone: [phone] Fax: [phone]  [id number] - [place], [place] - [address] [place] [address] Phone: [phone] Fax: [phone]     Social Drivers of Health (SDOH) Social History: SDOH Screenings   Food Insecurity: No Food Insecurity (01/27/2024)  Housing: Low Risk  (01/27/2024)  Transportation Needs: No Transportation Needs (01/27/2024)  Utilities: Not At Risk (01/27/2024)  Depression (PHQ2-9): Low Risk  (05/19/2023)  Social Connections: Patient Declined (01/27/2024)  Tobacco Use: Low Risk  (01/28/2024)   SDOH Interventions:     Readmission Risk Interventions     No data to display

## 2024-01-28 NOTE — Transfer of Care (Signed)
 Immediate Anesthesia Transfer of Care Note  Patient: Kent Montgomery  Procedure(s) Performed: EXPLORATORY LAPAROTOMY, SEGMENTAL SMALL BOWEL RESECTION, RESECTION OF MESENTERIC MASS  Patient Location: PACU  Anesthesia Type:General  Level of Consciousness: awake, alert , and oriented  Airway & Oxygen Therapy: Patient Spontanous Breathing and Patient connected to face mask oxygen  Post-op Assessment: Report given to RN and Post -op Vital signs reviewed and stable  Post vital signs: Reviewed and stable  Last Vitals:  Vitals Value Taken Time  BP 153/93 01/28/24 13:38  Temp    Pulse 71 01/28/24 13:40  Resp 13 01/28/24 13:40  SpO2 99 % 01/28/24 13:40  Vitals shown include unfiled device data.  Last Pain:  Vitals:   01/28/24 1107  TempSrc: Oral  PainSc:       Patients Stated Pain Goal: 0 (01/27/24 1756)  Complications: No notable events documented.

## 2024-01-28 NOTE — Anesthesia Procedure Notes (Signed)
 Procedure Name: Intubation Date/Time: 01/28/2024 11:51 AM  Performed by: Buster Catheryn SAUNDERS, CRNAPre-anesthesia Checklist: Patient identified, Emergency Drugs available, Suction available and Patient being monitored Patient Re-evaluated:Patient Re-evaluated prior to induction Oxygen Delivery Method: Circle system utilized Preoxygenation: Pre-oxygenation with 100% oxygen Induction Type: IV induction Ventilation: Mask ventilation without difficulty Laryngoscope Size: Miller and 2 Grade View: Grade II Tube type: Oral Tube size: 7.0 mm Number of attempts: 1 Airway Equipment and Method: Stylet and Oral airway Placement Confirmation: ETT inserted through vocal cords under direct vision, positive ETCO2 and breath sounds checked- equal and bilateral Secured at: 24 cm Tube secured with: Tape Dental Injury: Teeth and Oropharynx as per pre-operative assessment

## 2024-01-28 NOTE — Op Note (Signed)
 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  Patient had a 2 to 25-month history of abdominal pain which had been intermittent.  He was undergoing evaluation with his gastroenterologist.  CT scan of the abdomen and pelvis demonstrated a high-grade small bowel obstruction secondary to a mass in the small bowel consistent with carcinoid tumor.  There appeared to be a metastatic mesenteric lymph node adjacent to the mass.  Patient now comes to surgery for resection.  Procedure:  The patient was seen in the pre-op holding area. The risks, benefits, complications, treatment options, and expected outcomes were previously discussed with the patient. The patient agreed with the proposed plan and has signed the informed consent form.  The patient was brought to the operating room by the surgical team, identified as Kent Montgomery and the procedure verified. A time out was completed and the above information confirmed.  Following administration of general anesthesia, the patient is positioned and then prepped and draped in the usual aseptic fashion.  After ascertaining that an adequate level of anesthesia been achieved, an upper midline abdominal incision is made with a #10 blade.  Dissection is carried through subcutaneous tissues and hemostasis achieved with the electrocautery.  Fascia was incised in the midline and the peritoneal cavity is entered cautiously.  Adhesions of  small bowel to the anterior abdominal wall are taken down sharply and with judicious use of the electrocautery.  Exploration reveals a neoplastic mass in the small bowel, probably in the proximal ileum.  This is gently elevated.  Small bowel was then run proximally.  Small bowel was moderately dilated.  It is run up to the level of the ligament of Treitz.  With elevation of the small bowel a injury was sustained to the mesentery near the root where the palpable mesenteric mass was noted.  There was active venous bleeding which was controlled with 2-0 silk figure-of-eight sutures.  The mass was gently mobilized.  Clamps were placed circumferentially and the mass was sharply excised with the Metzenbaum scissors.  Suture ligatures were placed and clamps were removed.  After evaluation it is noted that this was close to the root of the mesentery.  Remaining bleeding was controlled with interrupted 2-0 silk figure-of-eight sutures.  The segment of small bowel containing the tumor was then resected, dividing the small bowel proximally and distally with GIA staplers.  The remaining mesentery was divided using the LigaSure.  Specimen was passed off the field.  Both the segment of small bowel containing tumor and the mesenteric mass were submitted separately to pathology for review.  The small bowel was again run from the ligament of Treitz to the point of division in the proximal ileum.  The distal segment of small bowel was viable and was followed into the right lower quadrant of the abdomen where there were multiple loops of bowel involved with nonobstructive adhesions from the patient's prior surgical procedure for complicated appendicitis.  These loops of bowel were not mobilized.  Next  a side-to-side functionally end-to-end anastomosis was created between the proximal and distal portions of the ileum.  This was done in a side-to-side fashion with a GIA stapler.  Enterotomy was closed with a TA 60 stapler.  Common  channel was widely open.  A suture was placed at the end of the staple line.  Mesenteric defect was closed with interrupted 2-0 silk figure-of-eight sutures.  Abdomen was irrigated with warm saline.  This was evacuated.  Good hemostasis was noted throughout the abdomen.  Omentum was used to cover the anastomosis.  Fascia was closed with interrupted #1 Novafil simple sutures.  Subcutaneous tissues were irrigated.  Skin was closed with stainless steel staples.  Wound was washed and dried and a honeycomb dressing was applied.  Patient was awakened from anesthesia and transferred to the recovery room in stable condition.  The patient tolerated the procedure well.   Krystal Spinner, MD [facility] Office: [phone]

## 2024-01-28 NOTE — Plan of Care (Signed)

## 2024-01-28 NOTE — Progress Notes (Signed)
 Assessment & Plan: HD#2 - small bowel obstruction secondary to carcinoid tumor  - partial obstruction, BM overnight  - NPO, IVF  - plan OR today for small bowel resection  Patient is admitted to the surgical service with signs and symptoms of partial small bowel obstruction.  Patient has had intermittent symptoms over the past 2 to 3 months.  He has been undergoing a workup by gastroenterology, Dr. Gordy Starch.  Imaging studies from yesterday demonstrate what appears to be a carcinoid tumor involving the small bowel and causing partial obstruction.  There also appears to be nodal metastasis.  This morning we discussed the need for operative intervention.  We discussed small bowel resection.  We discussed the possibility of resecting lymph nodes with metastatic disease.  We discussed the procedure and the postoperative course to be anticipated.  The patient understands and wishes to proceed.        Krystal Spinner, MD [facility] practice Office: [phone]        Chief Complaint: Partial SBO, carcinoid tumor  Subjective: Patient in bed, comfortable, BM overnight  Objective: Vital signs in last 24 hours: Temp:  [97.4 F (36.3 C)-98.9 F (37.2 C)] 97.9 F (36.6 C) (08/06 0532) Pulse Rate:  [70-91] 77 (08/06 0532) Resp:  [15-18] 16 (08/06 0532) BP: (109-142)/(66-94) 116/70 (08/06 0532) SpO2:  [95 %-100 %] 95 % (08/06 0532) Weight:  [97.1 kg] 97.1 kg (08/05 0843) Last BM Date : 01/27/24  Intake/Output from previous day: 08/05 0701 - 08/06 0700 In: 628.6 [P.O.:120; I.V.:508.6] Out: -  Intake/Output this shift: No intake/output data recorded.  Physical Exam: HEENT - sclerae clear, mucous membranes moist Neck - soft Abdomen - soft without distension, non-tender; well healed transverse abd incision Ext - no edema, non-tender  Lab Results:  Recent Labs    01/27/24 0853 01/28/24 0444  WBC 6.7 4.0  HGB 16.9 15.3  HCT 49.2 47.3  PLT 218 185    BMET Recent Labs    01/27/24 0853 01/28/24 0444  NA 137 132*  K 3.9 3.4*  CL 102 101  CO2 22 22  GLUCOSE 116* 103*  BUN 12 13  CREATININE 1.02 1.03  CALCIUM 8.9 7.9*   PT/INR No results for input(s): LABPROT, INR in the last 72 hours. Comprehensive Metabolic Panel:    Component Value Date/Time   NA 132 (L) 01/28/2024 0444   NA 137 01/27/2024 0853   K 3.4 (L) 01/28/2024 0444   K 3.9 01/27/2024 0853   CL 101 01/28/2024 0444   CL 102 01/27/2024 0853   CO2 22 01/28/2024 0444   CO2 22 01/27/2024 0853   BUN 13 01/28/2024 0444   BUN 12 01/27/2024 0853   CREATININE 1.03 01/28/2024 0444   CREATININE 1.02 01/27/2024 0853   CREATININE 1.23 05/06/2023 1500   GLUCOSE 103 (H) 01/28/2024 0444   GLUCOSE 116 (H) 01/27/2024 0853   CALCIUM 7.9 (L) 01/28/2024 0444   CALCIUM 8.9 01/27/2024 0853   AST 31 01/27/2024 0853   AST 18 11/20/2023 1011   AST 23 05/06/2023 1500   ALT 59 (H) 01/27/2024 0853   ALT 23 11/20/2023 1011   ALT 35 05/06/2023 1500   ALKPHOS 142 (H) 01/27/2024 0853   ALKPHOS 86 11/20/2023 1011   BILITOT 1.1 01/27/2024 0853   BILITOT 0.6 11/20/2023 1011   BILITOT 0.7 05/06/2023 1500   PROT 7.1 01/27/2024 0853   PROT 6.8 11/20/2023 1011   ALBUMIN 4.6 01/27/2024 0853   ALBUMIN  4.1 11/20/2023 1011    Studies/Results: CT ABDOMEN PELVIS W CONTRAST Result Date: 01/27/2024 CLINICAL DATA:  Right lower quadrant abdominal pain since April. Intermittent. 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 * Tracking Code: BO * EXAM: CT ABDOMEN AND PELVIS WITH CONTRAST TECHNIQUE: Multidetector CT imaging of the abdomen and pelvis was performed using the standard protocol following bolus administration of intravenous contrast. RADIATION DOSE REDUCTION: This exam was performed according to the departmental dose-optimization program which includes automated exposure control, adjustment of the mA and/or kV according to patient size and/or use of iterative  reconstruction technique. CONTRAST:  OMNIPAQUE  IOHEXOL  300 MG/ML  SOLN COMPARISON:  MRI of 11/24/2023. PET of 05/16/2023. 10/24/2021 abdominopelvic CT. FINDINGS: Lower chest: Left lower lobe calcified granulomas. Normal heart size without pericardial or pleural effusion. Hepatobiliary: Normal liver. Normal gallbladder, without biliary ductal dilatation. Pancreas: Pancreatic head subcentimeter cystic lesion was detailed on 11/24/2023 MRI. No duct dilatation or acute inflammation. Spleen: Normal in size, without focal abnormality. Adrenals/Urinary Tract: Normal adrenal glands. Normal kidneys, without hydronephrosis. Normal urinary bladder. Stomach/Bowel: Normal stomach, without wall thickening. Scattered colonic diverticula. Normal terminal ileum. Appendectomy per report. Mid small bowel dilatation is significant up to 5.5 cm on 36/2. Followed to the level of an obstructive enhancing small bowel mass of 2.4 x 2.0 cm on 35/2. No complicating ischemia. Vascular/Lymphatic: Aortic atherosclerosis. Adjacent to the enhancing small bowel mass is a small bowel mesenteric enhancing lesion of 2.6 x 2.3 cm on 42/2. Mesenteric retraction and smaller surrounding mesenteric nodes. No pelvic sidewall adenopathy. Reproductive: Mild prostatomegaly. Other: Tiny fat containing left inguinal hernia. No significant free fluid. No evidence of omental or peritoneal disease. Musculoskeletal: Lumbosacral spondylosis. IMPRESSION: 1. 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 High-grade partial obstruction secondary to the small bowel primary. 2. Incidental findings, including: Aortic Atherosclerosis (ICD10-I70.0). Prostatomegaly. Electronically Signed   By: Rockey Kilts M.D.   On: 01/27/2024 12:31      Krystal Spinner 01/28/2024  Patient ID: Kent Montgomery, male   DOB: [birth year], 68 y.o.   MRN: [id number]

## 2024-01-29 ENCOUNTER — Encounter (HOSPITAL_COMMUNITY): Payer: Self-pay | Admitting: Surgery

## 2024-01-29 LAB — CBC
HCT: 40.7 % (ref 39.0–52.0)
HCT: 40 % (ref 39.0–52.0)
Hemoglobin: 13.5 g/dL (ref 13.0–17.0)
Hemoglobin: 13.3 g/dL (ref 13.0–17.0)
MCH: 28.4 pg (ref 26.0–34.0)
MCH: 28.1 pg (ref 26.0–34.0)
MCHC: 33.2 g/dL (ref 30.0–36.0)
MCHC: 33.3 g/dL (ref 30.0–36.0)
MCV: 85.7 fL (ref 80.0–100.0)
MCV: 84.6 fL (ref 80.0–100.0)
Platelets: 174 K/uL (ref 150–400)
Platelets: 176 K/uL (ref 150–400)
RBC: 4.75 MIL/uL (ref 4.22–5.81)
RBC: 4.73 MIL/uL (ref 4.22–5.81)
RDW: 14.9 % (ref 11.5–15.5)
RDW: 14.9 % (ref 11.5–15.5)
WBC: 11.6 K/uL — ABNORMAL HIGH (ref 4.0–10.5)
WBC: 9.7 K/uL (ref 4.0–10.5)
nRBC: 0 % (ref 0.0–0.2)
nRBC: 0 % (ref 0.0–0.2)

## 2024-01-29 LAB — BASIC METABOLIC PANEL WITH GFR
Anion gap: 8 (ref 5–15)
BUN: 12 mg/dL (ref 8–23)
CO2: 22 mmol/L (ref 22–32)
Calcium: 7.8 mg/dL — ABNORMAL LOW (ref 8.9–10.3)
Chloride: 104 mmol/L (ref 98–111)
Creatinine, Ser: 0.88 mg/dL (ref 0.61–1.24)
GFR, Estimated: >60 mL/min (ref >60)
Glucose, Bld: 131 mg/dL — ABNORMAL HIGH (ref 70–99)
Potassium: 3.9 mmol/L (ref 3.5–5.1)
Sodium: 134 mmol/L — ABNORMAL LOW (ref 135–145)

## 2024-01-29 LAB — CEA: CEA: 0.8 ng/mL (ref 0.0–4.7)

## 2024-01-29 LAB — CREATININE, SERUM
Creatinine, Ser: 0.9 mg/dL (ref 0.61–1.24)
GFR, Estimated: >60 mL/min (ref >60)

## 2024-01-29 MED ORDER — HEPARIN SODIUM (PORCINE) 5000 UNIT/ML IJ SOLN
5000.0000 [IU] | Freq: Three times a day (TID) | INTRAMUSCULAR | Status: DC
  Administered 2024-01-29 – 2024-02-02 (×12): 5000 [IU] via SUBCUTANEOUS
  Filled 2024-01-29 (×11): qty 1

## 2024-01-29 MED ORDER — KCL IN DEXTROSE-NACL 20-5-0.45 MEQ/L-%-% IV SOLN
INTRAVENOUS | Status: DC
  Filled 2024-01-29 (×7): qty 1000

## 2024-01-29 NOTE — Progress Notes (Signed)
 Progress Note  1 Day Post-Op  Subjective: Patient reports generalized abdominal pain but feels that it is being managed well with PAC. Patient has not had a bowel movement or flatulence. Reports gurgling. Denies nausea and vomiting.    ROS  All negative with the exception of above.  Objective: Vital signs in last 24 hours: Temp:  [97.6 F (36.4 C)-98.9 F (37.2 C)] 98.4 F (36.9 C) (08/07 0447) Pulse Rate:  [66-93] 79 (08/07 0447) Resp:  [9-24] 24 (08/07 0752) BP: (114-167)/(70-97) 130/84 (08/07 0447) SpO2:  [97 %-100 %] 100 % (08/07 0447) Weight:  [97 kg] 97 kg (08/06 1107) Last BM Date : 01/28/24  Intake/Output from previous day: 08/06 0701 - 08/07 0700 In: 2923.3 [P.O.:30; I.V.:2793.3; IV Piggyback:100] Out: 1700 [Urine:1200; Blood:500] Intake/Output this shift: Total I/O In: -  Out: 350 [Urine:350]  PE: General: Pleasant, male who is sitting in chair at bedside. Lungs: Respiratory effort nonlabored Abd: Soft and ND. Generalized tenderness to palpation. Midline incision C/D/I with stables. Incision is covered with honeycomb dressing. Psych: A&Ox3 with an appropriate affect.    Lab Results:  Recent Labs    01/28/24 0444 01/29/24 0443  WBC 4.0 11.6*  HGB 15.3 13.5  HCT 47.3 40.7  PLT 185 174   BMET Recent Labs    01/28/24 0444 01/29/24 0443  NA 132* 134*  K 3.4* 3.9  CL 101 104  CO2 22 22  GLUCOSE 103* 131*  BUN 13 12  CREATININE 1.03 0.88  CALCIUM 7.9* 7.8*   PT/INR No results for input(s): LABPROT, INR in the last 72 hours. CMP     Component Value Date/Time   NA 134 (L) 01/29/2024 0443   K 3.9 01/29/2024 0443   CL 104 01/29/2024 0443   CO2 22 01/29/2024 0443   GLUCOSE 131 (H) 01/29/2024 0443   BUN 12 01/29/2024 0443   CREATININE 0.88 01/29/2024 0443   CREATININE 1.23 05/06/2023 1500   CALCIUM 7.8 (L) 01/29/2024 0443   PROT 7.1 01/27/2024 0853   ALBUMIN 4.6 01/27/2024 0853   AST 31 01/27/2024 0853   AST 23 05/06/2023 1500    ALT 59 (H) 01/27/2024 0853   ALT 35 05/06/2023 1500   ALKPHOS 142 (H) 01/27/2024 0853   BILITOT 1.1 01/27/2024 0853   BILITOT 0.7 05/06/2023 1500   GFRNONAA >60 01/29/2024 0443   GFRNONAA >60 05/06/2023 1500   GFRAA >60 01/09/2018 0836   Lipase     Component Value Date/Time   LIPASE 44 01/27/2024 0853       Studies/Results: CT ABDOMEN PELVIS W CONTRAST Result Date: 01/27/2024 CLINICAL DATA:  Right lower quadrant abdominal pain since April. Intermittent. 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 * Tracking Code: BO * EXAM: CT ABDOMEN AND PELVIS WITH CONTRAST TECHNIQUE: Multidetector CT imaging of the abdomen and pelvis was performed using the standard protocol following bolus administration of intravenous contrast. RADIATION DOSE REDUCTION: This exam was performed according to the departmental dose-optimization program which includes automated exposure control, adjustment of the mA and/or kV according to patient size and/or use of iterative reconstruction technique. CONTRAST:  OMNIPAQUE  IOHEXOL  300 MG/ML  SOLN COMPARISON:  MRI of 11/24/2023. PET of 05/16/2023. 10/24/2021 abdominopelvic CT. FINDINGS: Lower chest: Left lower lobe calcified granulomas. Normal heart size without pericardial or pleural effusion. Hepatobiliary: Normal liver. Normal gallbladder, without biliary ductal dilatation. Pancreas: Pancreatic head subcentimeter cystic lesion was detailed on 11/24/2023 MRI. No duct dilatation or acute inflammation. Spleen: Normal in size,  without focal abnormality. Adrenals/Urinary Tract: Normal adrenal glands. Normal kidneys, without hydronephrosis. Normal urinary bladder. Stomach/Bowel: Normal stomach, without wall thickening. Scattered colonic diverticula. Normal terminal ileum. Appendectomy per report. Mid small bowel dilatation is significant up to 5.5 cm on 36/2. Followed to the level of an obstructive enhancing small bowel mass of 2.4 x 2.0 cm on 35/2. No  complicating ischemia. Vascular/Lymphatic: Aortic atherosclerosis. Adjacent to the enhancing small bowel mass is a small bowel mesenteric enhancing lesion of 2.6 x 2.3 cm on 42/2. Mesenteric retraction and smaller surrounding mesenteric nodes. No pelvic sidewall adenopathy. Reproductive: Mild prostatomegaly. Other: Tiny fat containing left inguinal hernia. No significant free fluid. No evidence of omental or peritoneal disease. Musculoskeletal: Lumbosacral spondylosis. IMPRESSION: 1. 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 High-grade partial obstruction secondary to the small bowel primary. 2. Incidental findings, including: Aortic Atherosclerosis (ICD10-I70.0). Prostatomegaly. Electronically Signed   By: Rockey Kilts M.D.   On: 01/27/2024 12:31    Anti-infectives: Anti-infectives (From admission, onward)    Start     Dose/Rate Route Frequency Ordered Stop   01/28/24 1115  ertapenem  (INVANZ ) 1 g in sodium chloride  0.9 % 100 mL IVPB        1 g 200 mL/hr over 30 Minutes Intravenous On call to O.R. 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 Continue NPO with ice chips  FEN: NPO except for ice chips; IVF CUZ:DRId ID: Ertapenem  in sodium chloride  surgical prophylaxis    LOS: 2 days   I reviewed specialists notes, nursing notes, last 24 h vitals and pain scores, last 48 h intake and output, last 24 h labs and trends, and last 24 h imaging results.   Marjorie Carlyon Favre, [facility] 01/29/2024, 8:27 AM Please see Amion for pager number during day hours 7:00am-4:30pm

## 2024-01-29 NOTE — Plan of Care (Signed)

## 2024-01-30 NOTE — Plan of Care (Signed)
  Problem: Education: Goal: Knowledge of General Education information will improve Description: Including pain rating scale, medication(s)/side effects and non-pharmacologic comfort measures Outcome: Progressing   Problem: Elimination: Goal: Will not experience complications related to bowel motility Outcome: Progressing Goal: Will not experience complications related to urinary retention Outcome: Progressing   Problem: Pain Managment: Goal: General experience of comfort will improve and/or be controlled Outcome: Progressing   Problem: Safety: Goal: Ability to remain free from injury will improve Outcome: Progressing

## 2024-01-30 NOTE — Progress Notes (Signed)
 Progress Note  2 Days Post-Op  Subjective: Patient report continued soreness. Overall, pain has improved. Has not had a bowel movement or flatulence. Denies nausea or vomiting. Ambulating well.  ROS  All negative with the exception of above.  Objective: Vital signs in last 24 hours: Temp:  [98 F (36.7 C)-98.5 F (36.9 C)] 98 F (36.7 C) (08/08 0447) Pulse Rate:  [74-95] 95 (08/08 0447) Resp:  [14-23] 14 (08/08 0753) BP: (136-152)/(81-89) 152/88 (08/08 0447) SpO2:  [97 %-100 %] 98 % (08/08 0447) Last BM Date : 01/28/24  Intake/Output from previous day: 08/07 0701 - 08/08 0700 In: 1231.7 [I.V.:1231.7] Out: 1600 [Urine:1600] Intake/Output this shift: No intake/output data recorded.  PE: General: Pleasant, male who is sitting in chair at bedside. Lungs: Respiratory effort nonlabored Abd: Soft with distention. Generalized tenderness to palpation. Midline incision C/D/I with staples. Incision is covered with honeycomb dressing. Psych: A&Ox3 with an appropriate affect.   Lab Results:  Recent Labs    01/29/24 0443 01/29/24 1750  WBC 11.6* 9.7  HGB 13.5 13.3  HCT 40.7 40.0  PLT 174 176   BMET Recent Labs    01/28/24 0444 01/29/24 0443 01/29/24 1750  NA 132* 134*  --   K 3.4* 3.9  --   CL 101 104  --   CO2 22 22  --   GLUCOSE 103* 131*  --   BUN 13 12  --   CREATININE 1.03 0.88 0.90  CALCIUM 7.9* 7.8*  --    PT/INR No results for input(s): LABPROT, INR in the last 72 hours. CMP     Component Value Date/Time   NA 134 (L) 01/29/2024 0443   K 3.9 01/29/2024 0443   CL 104 01/29/2024 0443   CO2 22 01/29/2024 0443   GLUCOSE 131 (H) 01/29/2024 0443   BUN 12 01/29/2024 0443   CREATININE 0.90 01/29/2024 1750   CREATININE 1.23 05/06/2023 1500   CALCIUM 7.8 (L) 01/29/2024 0443   PROT 7.1 01/27/2024 0853   ALBUMIN 4.6 01/27/2024 0853   AST 31 01/27/2024 0853   AST 23 05/06/2023 1500   ALT 59 (H) 01/27/2024 0853   ALT 35 05/06/2023 1500   ALKPHOS 142  (H) 01/27/2024 0853   BILITOT 1.1 01/27/2024 0853   BILITOT 0.7 05/06/2023 1500   GFRNONAA >60 01/29/2024 1750   GFRNONAA >60 05/06/2023 1500   GFRAA >60 01/09/2018 0836   Lipase     Component Value Date/Time   LIPASE 44 01/27/2024 0853       Studies/Results: No results found.  Anti-infectives: Anti-infectives (From admission, onward)    Start     Dose/Rate Route Frequency Ordered Stop   01/28/24 1115  ertapenem  (INVANZ ) 1 g in sodium chloride  0.9 % 100 mL IVPB        1 g 200 mL/hr over 30 Minutes Intravenous On call to O.R. 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 Hypertensive -Pathology pending -WBC WNL on 8/7 -Exam significant for generalized tenderness; Continue PCA -No bowel movements or flatulence yet. Will advance to CLD   FEN: CLD; IVF CUZ:DRId; Heparin  injections ID: None currently   LOS: 3 days   I reviewed nursing notes, last 24 h vitals and pain scores, last 48 h intake and output, last 24 h labs and trends, and last 24 h imaging results.   Marjorie Carlyon Favre, PA-C  [place] 01/30/2024, 8:00 AM Please see Amion for pager number during day hours 7:00am-4:30pm

## 2024-01-31 MED ORDER — ACETAMINOPHEN 500 MG PO TABS
1000.0000 mg | ORAL_TABLET | Freq: Four times a day (QID) | ORAL | Status: DC | PRN
  Administered 2024-01-31: 1000 mg via ORAL
  Filled 2024-01-31: qty 2

## 2024-01-31 MED ORDER — HYDROMORPHONE HCL 1 MG/ML IJ SOLN: 1.0000 mg | INTRAMUSCULAR | Status: DC | PRN

## 2024-01-31 MED ORDER — METOCLOPRAMIDE HCL 5 MG/ML IJ SOLN
10.0000 mg | Freq: Once | INTRAMUSCULAR | Status: AC
  Administered 2024-01-31: 10 mg via INTRAVENOUS
  Filled 2024-01-31: qty 2

## 2024-01-31 MED ORDER — LORATADINE 10 MG PO TABS
10.0000 mg | ORAL_TABLET | Freq: Every day | ORAL | Status: DC
  Administered 2024-01-31 – 2024-02-02 (×4): 10 mg via ORAL
  Filled 2024-01-31 (×3): qty 1

## 2024-01-31 NOTE — Progress Notes (Signed)
 3 Days Post-Op   Subjective/Chief Complaint: Denies nausea No flatus yet   Objective: Vital signs in last 24 hours: Temp:  [97.9 F (36.6 C)-98.4 F (36.9 C)] 98.3 F (36.8 C) (08/09 0533) Pulse Rate:  [77-97] 77 (08/09 0533) Resp:  [15-18] 16 (08/09 0751) BP: (108-144)/(70-80) 120/80 (08/09 0533) SpO2:  [96 %-100 %] 98 % (08/09 0533) FiO2 (%):  [0 %] 0 % (08/09 0430) Last BM Date : 01/28/24  Intake/Output from previous day: 08/08 0701 - 08/09 0700 In: 1800.7 [P.O.:1460; I.V.:340.7] Out: 0  Intake/Output this shift: No intake/output data recorded.  Exam: Awake and alert Abdomen soft, mildly full, dressing dry  Lab Results:  Recent Labs    01/29/24 0443 01/29/24 1750  WBC 11.6* 9.7  HGB 13.5 13.3  HCT 40.7 40.0  PLT 174 176   BMET Recent Labs    01/29/24 0443 01/29/24 1750  NA 134*  --   K 3.9  --   CL 104  --   CO2 22  --   GLUCOSE 131*  --   BUN 12  --   CREATININE 0.88 0.90  CALCIUM 7.8*  --    PT/INR No results for input(s): LABPROT, INR in the last 72 hours. ABG No results for input(s): PHART, HCO3 in the last 72 hours.  Invalid input(s): PCO2, PO2  Studies/Results: No results found.  Anti-infectives: Anti-infectives (From admission, onward)    Start     Dose/Rate Route Frequency Ordered Stop   01/28/24 1115  ertapenem  (INVANZ ) 1 g in sodium chloride  0.9 % 100 mL IVPB        1 g 200 mL/hr over 30 Minutes Intravenous On call to O.R. 01/28/24 1025 01/28/24 1156       Assessment/Plan: POD3: S/P possible Carcinoid tumor of small bowel with mesenteric metastasis, small bowel obstruction   LOS: 4 days   -awaiting return of bowel function -keep on clear liquids for now -path pending  Kent Montgomery 01/31/2024

## 2024-01-31 NOTE — Plan of Care (Signed)

## 2024-02-01 MED ORDER — PANTOPRAZOLE SODIUM 40 MG PO TBEC
40.0000 mg | DELAYED_RELEASE_TABLET | Freq: Every day | ORAL | Status: DC
  Administered 2024-02-01: 40 mg via ORAL
  Filled 2024-02-01: qty 1

## 2024-02-01 MED ORDER — ACETAMINOPHEN 500 MG PO TABS: 1000.0000 mg | ORAL_TABLET | Freq: Four times a day (QID) | ORAL | Status: DC | PRN

## 2024-02-01 MED ORDER — OXYCODONE HCL 5 MG PO TABS: 5.0000 mg | ORAL_TABLET | ORAL | Status: DC | PRN

## 2024-02-01 NOTE — Plan of Care (Signed)
 Pt affect calm and cooperative. Pt has some abdominal tenderness. Voids independently; LBM 8/10. Tolerating clear liquid diet well. He is up and walking the hallways independently. Will continue to monitor.

## 2024-02-01 NOTE — Progress Notes (Signed)
 4 Days Post-Op   Subjective/Chief Complaint: Pain is improved.  He is now off the PCA. He had a bowel movement and has had no nausea   Objective: Vital signs in last 24 hours: Temp:  [98 F (36.7 C)-98.4 F (36.9 C)] 98 F (36.7 C) (08/10 0516) Pulse Rate:  [74-89] 74 (08/10 0516) Resp:  [15-19] 18 (08/10 0516) BP: (125-137)/(66-88) 125/80 (08/10 0516) SpO2:  [97 %-99 %] 97 % (08/10 0516) Last BM Date : 02/01/24  Intake/Output from previous day: 08/09 0701 - 08/10 0700 In: 3597.6 [P.O.:1140; I.V.:2457.6] Out: 0  Intake/Output this shift: No intake/output data recorded.  Exam: Awake and alert Looks comfortable Abdomen is soft.  There is no distention.  His incision is healing well  Lab Results:  Recent Labs    01/29/24 1750  WBC 9.7  HGB 13.3  HCT 40.0  PLT 176   BMET Recent Labs    01/29/24 1750  CREATININE 0.90   PT/INR No results for input(s): LABPROT, INR in the last 72 hours. ABG No results for input(s): PHART, HCO3 in the last 72 hours.  Invalid input(s): PCO2, PO2  Studies/Results: No results found.  Anti-infectives: Anti-infectives (From admission, onward)    Start     Dose/Rate Route Frequency Ordered Stop   01/28/24 1115  ertapenem  (INVANZ ) 1 g in sodium chloride  0.9 % 100 mL IVPB        1 g 200 mL/hr over 30 Minutes Intravenous On call to O.R. 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  We will advance to full liquid diet and right advance as tolerated -PCA has been stopped and he is placed on oral pain medication with IV backup -Decreasing IV fluids -Awaiting final pathology.  He may be ready for discharge in the next 24 hours. Vicenta Poli 02/01/2024

## 2024-02-02 ENCOUNTER — Other Ambulatory Visit (HOSPITAL_COMMUNITY): Payer: Self-pay

## 2024-02-02 LAB — SURGICAL PATHOLOGY

## 2024-02-02 MED ORDER — ACETAMINOPHEN 500 MG PO TABS: 1000.0000 mg | ORAL_TABLET | Freq: Four times a day (QID) | ORAL | Status: DC | PRN

## 2024-02-02 MED ORDER — OXYCODONE HCL 5 MG PO TABS
5.0000 mg | ORAL_TABLET | Freq: Four times a day (QID) | ORAL | 0 refills | Status: DC | PRN
  Filled 2024-02-02: qty 15, 2d supply, fill #0

## 2024-02-02 NOTE — Progress Notes (Signed)
 Oncology Discharge Planning Note  [facility] Address: [address], [place], [place] [address] Hours of Operation:  [place], Monday - Friday  Clinic Contact Information:  [phone]) [phone]  Oncology Care Team: Medical Oncologist:  Cloretta  Patient Details: Name:  Kent Montgomery, Kent Montgomery MRN:   [id number] DOB:   Oct 17, 1955 Reason for Current Admission: @PPROB @  Discharge Planning Narrative: Notification of admission received by Inpatient team for [name].  Discharge follow-up appointments for oncology are current and available on the AVS and MyChart.   Upon discharge from the hospital, hematology/oncology's post discharge plan of care for the outpatient setting is: August 25,2025 at  2:10pm with Dr Cloretta  [facility] [address] [place], [place] [address]  [phone]  [name] will be called within two business days after discharge to review hematology/oncology's plan of care for full understanding.    Outpatient Oncology Specific Care Only: Oncology appointment transportation needs addressed?:  no Oncology medication management for symptom management addressed?:  not applicable Chemo Alert Card reviewed?:  not applicable Immunotherapy Alert Card reviewed?:  not applicable

## 2024-02-02 NOTE — Discharge Instructions (Signed)
 [facility]      [facility], [place] [phone]  OPEN ABDOMINAL SURGERY: POST OP INSTRUCTIONS  Always review your discharge instruction sheet given to you by the facility where your surgery was performed.  IF YOU HAVE DISABILITY OR FAMILY LEAVE FORMS, YOU MUST BRING THEM TO THE OFFICE FOR PROCESSING.  PLEASE DO NOT GIVE THEM TO YOUR DOCTOR.  A prescription for pain medication may be given to you upon discharge.  Take your pain medication as prescribed, if needed.  If narcotic pain medicine is not needed, then you may take acetaminophen  (Tylenol ) or ibuprofen (Advil) as needed. Take your usually prescribed medications unless otherwise directed. If you need a refill on your pain medication, please contact your pharmacy. They will contact our office to request authorization.  Prescriptions will not be filled after 5pm or on week-ends. You should follow a light diet the first few days after arrival home, such as soup and crackers, pudding, etc.unless your doctor has advised otherwise. A high-fiber, low fat diet can be resumed as tolerated.   Be sure to include lots of fluids daily. Most patients will experience some swelling and bruising on the chest and neck area.  Ice packs will help.  Swelling and bruising can take several days to resolve Most patients will experience some swelling and bruising in the area of the incision. Ice pack will help. Swelling and bruising can take several days to resolve..  It is common to experience some constipation if taking pain medication after surgery.  Increasing fluid intake and taking a stool softener will usually help or prevent this problem from occurring.  A mild laxative (Milk of Magnesia or Miralax) should be taken according to package directions if there are no bowel movements after 48 hours.  You may have steri-strips (small skin tapes) in place directly over the incision.  These strips should be left on the skin for 7-10 days.  If your surgeon used skin  glue on the incision, you may shower in 24 hours.  The glue will flake off over the next 2-3 weeks.  Any sutures or staples will be removed at the office during your follow-up visit. You may find that a light gauze bandage over your incision may keep your staples from being rubbed or pulled. You may shower and replace the bandage daily. ACTIVITIES:  You may resume regular (light) daily activities beginning the next day--such as daily self-care, walking, climbing stairs--gradually increasing activities as tolerated.  You may have sexual intercourse when it is comfortable.  Refrain from any heavy lifting or straining until approved by your doctor. You may drive when you no longer are taking prescription pain medication, you can comfortably wear a seatbelt, and you can safely maneuver your car and apply brakes Return to Work: ___________________________________ Kent Montgomery should see your doctor in the office for a follow-up appointment approximately two weeks after your surgery.  Make sure that you call for this appointment within a day or two after you arrive home to insure a convenient appointment time. OTHER INSTRUCTIONS:  _____________________________________________________________ _____________________________________________________________  WHEN TO CALL YOUR DOCTOR: Fever over 101.0 Inability to urinate Nausea and/or vomiting Extreme swelling or bruising Continued bleeding from incision. Increased pain, redness, or drainage from the incision. Difficulty swallowing or breathing Muscle cramping or spasms. Numbness or tingling in hands or feet or around lips.  The clinic staff is available to answer your questions during regular business hours.  Please don't hesitate to call and ask to speak to one of  the nurses if you have concerns.  For further questions, please visit [email or web address]

## 2024-02-02 NOTE — Progress Notes (Signed)
 Progress Note  5 Days Post-Op  Subjective: Patient reports pain is manageable. No significant pain or worsening of pain. Reports bowel movement that was more formed. Reports flatulence. Tolerating FLD. Denies nausea and vomiting.   Patient reports that his job does not require a significant amount of movement or heavy lifting.   ROS  All negative with the exception of above.  Objective: Vital signs in last 24 hours: Temp:  [98 F (36.7 C)-98.3 F (36.8 C)] 98 F (36.7 C) (08/11 0523) Pulse Rate:  [78-90] 78 (08/11 0523) Resp:  [17-19] 17 (08/11 0523) BP: (110-144)/(70-90) 144/90 (08/11 0523) SpO2:  [96 %-99 %] 97 % (08/11 0523) Last BM Date : 02/01/24  Intake/Output from previous day: 08/10 0701 - 08/11 0700 In: 2079.3 [P.O.:1600; I.V.:479.3] Out: 0  Intake/Output this shift: No intake/output data recorded.  PE: General: pleasant, male who is laying in bed in NAD Lungs: Respiratory effort nonlabored Abd: Soft with distention. Generalized tenderness to palpation. Midline incision C/D/I with staples. Incision is covered with honeycomb dressing. Psych: A&Ox3 with an appropriate affect.    Lab Results:  No results for input(s): WBC, HGB, HCT, PLT in the last 72 hours. BMET No results for input(s): NA, K, CL, CO2, GLUCOSE, BUN, CREATININE, CALCIUM in the last 72 hours. PT/INR No results for input(s): LABPROT, INR in the last 72 hours. CMP     Component Value Date/Time   NA 134 (L) 01/29/2024 0443   K 3.9 01/29/2024 0443   CL 104 01/29/2024 0443   CO2 22 01/29/2024 0443   GLUCOSE 131 (H) 01/29/2024 0443   BUN 12 01/29/2024 0443   CREATININE 0.90 01/29/2024 1750   CREATININE 1.23 05/06/2023 1500   CALCIUM 7.8 (L) 01/29/2024 0443   PROT 7.1 01/27/2024 0853   ALBUMIN 4.6 01/27/2024 0853   AST 31 01/27/2024 0853   AST 23 05/06/2023 1500   ALT 59 (H) 01/27/2024 0853   ALT 35 05/06/2023 1500   ALKPHOS 142 (H) 01/27/2024 0853   BILITOT  1.1 01/27/2024 0853   BILITOT 0.7 05/06/2023 1500   GFRNONAA >60 01/29/2024 1750   GFRNONAA >60 05/06/2023 1500   GFRAA >60 01/09/2018 0836   Lipase     Component Value Date/Time   LIPASE 44 01/27/2024 0853       Studies/Results: No results found.  Anti-infectives: Anti-infectives (From admission, onward)    Start     Dose/Rate Route Frequency Ordered Stop   01/28/24 1115  ertapenem  (INVANZ ) 1 g in sodium chloride  0.9 % 100 mL IVPB        1 g 200 mL/hr over 30 Minutes Intravenous On call to O.R. 01/28/24 1025 01/28/24 1156        Assessment/Plan POD5: S/P Exploratory laparotomy, segmental small bowel resection, resection of mesenteric mass 01/28/2024 by Dr. Eletha.  -Afebrile. Hypertensive -Pathology pending -WBC WNL on 8/7 -Exam without significant concerns; Honeycomb dressing removed. -Reports bowel function and flatulence. Tolerating FLD. Can advance to soft diet as tolerated. Will consider discharge pending tolerating soft diet. -No longer needing PCA. Continue oral pain medications with IV backup -Discussed post-operative expectations and restrictions. He reports that he does not need a return letter for work. Follow up for RN visit in 10 days for staple removal to be arranged. Follow up in 4 weeks with physician to be arranged.   FEN: Advance to soft diet as tolerated; Dextrose  CUZ:DRId; Heparin  injections ID: None currently   LOS: 6 days   I reviewed specialist notes, nursing  notes, last 24 h vitals and pain scores, last 48 h intake and output, last 24 h labs and trends, and last 24 h imaging results.   Marjorie Carlyon Favre, [facility] 02/02/2024, 9:23 AM Please see Amion for pager number during day hours 7:00am-4:30pm

## 2024-02-02 NOTE — Plan of Care (Signed)
   Problem: Nutrition: Goal: Adequate nutrition will be maintained Outcome: Progressing

## 2024-02-02 NOTE — Plan of Care (Signed)
   Problem: Education: Goal: Knowledge of General Education information will improve Description: Including pain rating scale, medication(s)/side effects and non-pharmacologic comfort measures Outcome: Progressing   Problem: Activity: Goal: Risk for activity intolerance will decrease Outcome: Progressing   Problem: Nutrition: Goal: Adequate nutrition will be maintained Outcome: Progressing

## 2024-02-02 NOTE — Discharge Summary (Signed)
 [facility] Discharge Summary   Patient ID: Kent Montgomery MRN: [id number] DOB/AGE: Sep 03, 1955 68 y.o.  Admit date: 01/27/2024 Discharge date: 02/02/2024  Admitting Diagnosis: Bowel obstruction with small bowel mass  Discharge Diagnosis Patient Active Problem List   Diagnosis Date Noted   SBO (small bowel obstruction) (HCC) 01/27/2024   Agatston coronary artery calcium score of 29,  2022 02/13/2022   Nephrolithiasis 02/13/2022   History of basal cell carcinoma (BCC) of skin 02/12/2022   B12 deficiency 02/12/2022   Hyperglycemia 02/12/2022   History of GI bleed 01/26/2016   Sigmoid diverticulosis 01/26/2016   History of colonic polyps 01/26/2016   Hyperlipidemia 01/26/2016    Consultants General surgery  Imaging: CT Abdomen Pelvis W Contrast 01/27/2024  Procedures Dr. Krystal Spinner (01/28/2024) - Exploratory laparotomy, segmental small bowel resection, resection of mesenteric mass   Hospital Course:  Kent Montgomery is a 68 year old male who presented to the ED with abdominal pain. Workup showed a high-grade small bowel obstruction secondary to a mass in the small bowel consistent with carcinoid tumor. Patient was admitted and underwent procedure listed above. Tolerated procedure well and was transferred to the floor. Diet was advanced as tolerated. On POD5, the patient was voiding well, tolerating diet, ambulating well, pain well controlled, vital signs stable, incisions c/d/i and felt stable for discharge home. Patient will follow up in our office in 10 days for nurse visit to have staples removed. He will follow up outpatient with physician in 4 weeks. Patient knows to call with questions or concerns. He will call to confirm appointment date/time.    Physical Exam: General: pleasant, male who is laying in bed in NAD Lungs: Respiratory effort nonlabored Abd: Soft with distention. Generalized tenderness to palpation. Midline incision C/D/I with staples. Incision is  covered with honeycomb dressing. Psych: A&Ox3 with an appropriate affect.   I or a member of my team have reviewed this patient in the Controlled Substance Database.   Allergies as of 02/02/2024       Reactions   Droperidol Anaphylaxis   Pravastatin Other (See Comments)   Joint pain, elevated LFTs   Cefuroxime Axetil Rash        Medication List     TAKE these medications    acetaminophen  500 MG tablet Commonly known as: TYLENOL  Take 2 tablets (1,000 mg total) by mouth every 6 (six) hours as needed for mild pain (pain score 1-3).   COQ10 PO Take 1 tablet by mouth daily.   cyanocobalamin  1000 MCG tablet Commonly known as: VITAMIN B12 Take 1,000 mcg by mouth daily.   ibuprofen 200 MG tablet Commonly known as: ADVIL Take 200 mg by mouth every 6 (six) hours as needed for mild pain (pain score 1-3) or moderate pain (pain score 4-6).   MEGARED OMEGA-3 KRILL OIL PO Take 1 capsule by mouth daily.   multivitamin with minerals Tabs tablet Take 1 tablet by mouth daily.   oxyCODONE  5 MG immediate release tablet Commonly known as: Oxy IR/ROXICODONE  Take 1-2 tablets (5-10 mg total) by mouth every 6 (six) hours as needed for moderate pain (pain score 4-6).   Probiotic Daily Caps Take 1 capsule by mouth daily.   PSYLLIUM HUSK PO Take 5 capsules by mouth 2 (two) times daily.   TURMERIC PO Take 1 capsule by mouth daily.          Follow-up Information     [facility], [place]. Go to.   Specialty: General Surgery Why: Please arrive  for nurse visit 8/18 at 3:00 for staple removal.   Please arive for appointment 9/9 at 2:45 with Dr. Eletha, Arrive 15 mins prior to scheduled appointment time Contact information: [address] [place]  [address] [phone]                Signed: Marjorie Carlyon Edmundo DEVONNA [facility] 02/02/2024, 3:46 PM Please see Amion for pager number during day hours  7:00am-4:30pm

## 2024-02-02 NOTE — Progress Notes (Signed)
 Discharge instructions given to patient and all questions were answered.

## 2024-02-03 ENCOUNTER — Encounter: Payer: Commercial Managed Care - PPO | Admitting: Internal Medicine

## 2024-02-03 ENCOUNTER — Telehealth: Payer: Self-pay | Admitting: *Deleted

## 2024-02-03 NOTE — Telephone Encounter (Signed)
 Mr. Grewell asking for interpretation of his pathology report. Per Dr. Cloretta: shows carcinoid tumor and will require no treatment at this time. Will discuss in detail at next MD visit on 8/25.

## 2024-02-05 ENCOUNTER — Other Ambulatory Visit: Payer: Self-pay

## 2024-02-11 ENCOUNTER — Other Ambulatory Visit: Payer: Self-pay

## 2024-02-12 ENCOUNTER — Encounter: Admitting: Family Medicine

## 2024-02-12 ENCOUNTER — Telehealth: Payer: Self-pay

## 2024-02-12 NOTE — Telephone Encounter (Signed)
 Copied from CRM #[id number]. Topic: General - Other >> Feb 12, 2024  9:38 AM [place] wrote: Reason for CRM: patient upset that his appointment for a physical was cancelled today. He needed to get it done before 8/28 due to insurance. I told him I can schedule him and add to the wait list. Next available with Dr Geofm is 11/12. He said he was disappointed and was going to find healthcare somewhere else

## 2024-02-13 ENCOUNTER — Ambulatory Visit: Admitting: Internal Medicine

## 2024-02-13 NOTE — Telephone Encounter (Signed)
I can see him next week

## 2024-02-13 NOTE — Telephone Encounter (Signed)
Please advise if pt can be seen sooner.

## 2024-02-13 NOTE — Progress Notes (Signed)
 The proposed treatment discussed in conference is for discussion purpose only and is not a binding recommendation.  The patients have not been physically examined, or presented with their treatment options.  Therefore, final treatment plans cannot be decided.

## 2024-02-16 ENCOUNTER — Inpatient Hospital Stay: Attending: Oncology | Admitting: Oncology

## 2024-02-16 VITALS — BP 117/81 | HR 89 | Temp 98.3°F | Resp 20 | Ht 70.0 in | Wt 208.4 lb

## 2024-02-16 DIAGNOSIS — E538 Deficiency of other specified B group vitamins: Secondary | ICD-10-CM | POA: Insufficient documentation

## 2024-02-16 DIAGNOSIS — C851A Unspecified b-cell lymphoma, in remission: Secondary | ICD-10-CM | POA: Diagnosis not present

## 2024-02-16 DIAGNOSIS — Z923 Personal history of irradiation: Secondary | ICD-10-CM | POA: Insufficient documentation

## 2024-02-16 DIAGNOSIS — C7A098 Malignant carcinoid tumors of other sites: Secondary | ICD-10-CM | POA: Diagnosis not present

## 2024-02-16 DIAGNOSIS — E785 Hyperlipidemia, unspecified: Secondary | ICD-10-CM | POA: Diagnosis not present

## 2024-02-16 DIAGNOSIS — C7A012 Malignant carcinoid tumor of the ileum: Secondary | ICD-10-CM | POA: Insufficient documentation

## 2024-02-16 DIAGNOSIS — D3A012 Benign carcinoid tumor of the ileum: Secondary | ICD-10-CM | POA: Insufficient documentation

## 2024-02-16 NOTE — Progress Notes (Signed)
 [facility] OFFICE PROGRESS NOTE   Diagnosis: Cutaneous lymphoma, small bowel carcinoid tumor  INTERVAL HISTORY:   Kent Montgomery developed abdominal pain beginning in April of this year.  He developed emesis in May and was seen in the emergency room 11/16/2023.  An abdominal ultrasound revealed no acute finding.  There was possible sludge in the gallbladder.  He was referred to gastroenterology and was referred for an MRI/MRCP and HIDA scan.  The MRI showed no acute finding.  The HIDA scan revealed no evidence of cholecystitis.  He is into the emergency room 01/27/2024 with increased pain and nausea/vomiting.  CT abdomen/pelvis revealed mid small bowel dilation to the level and obstructing enhancing small bowel mass.  Adjacent to the small bowel mass there is a enhancing mesenteric lesion and smaller surrounding mesenteric nodes.  Dr. Eletha i was consulted and performed an floor Torrey laparotomy, segmental small bowel resection, and resection of the mesenteric mass on 01/28/2024.  Kent Montgomery has recovered from surgery.  He denies diarrhea and flushing spells.  He had constipation prior to surgery.  He had intermittent abdominal pain and several episodes of emesis over several months prior to surgery.  Objective:  Vital signs in last 24 hours:  Blood pressure 117/81, pulse 89, temperature 98.3 F (36.8 C), temperature source Temporal, resp. rate 20, height 5' 10 (1.778 m), weight 208 lb 6.4 oz (94.5 kg), SpO2 100%.    HEENT: Neck without mass Lymphatics: No cervical, supraclavicular, axillary, or inguinal nodes Resp: Lungs clear bilaterally Cardio: Regular rate and rhythm GI: No hepatomegaly, no mass, nontender, healed midline incision Vascular: No leg edema  Skin: Previous scalp lymphoma site without evidence of recurrent tumor  Portacath/PICC-without erythema  Lab Results:  Lab Results  Component Value Date   WBC 9.7 01/29/2024   HGB 13.3 01/29/2024   HCT 40.0  01/29/2024   MCV 84.6 01/29/2024   PLT 176 01/29/2024   NEUTROABS 7.0 11/16/2023    CMP  Lab Results  Component Value Date   NA 134 (L) 01/29/2024   K 3.9 01/29/2024   CL 104 01/29/2024   CO2 22 01/29/2024   GLUCOSE 131 (H) 01/29/2024   BUN 12 01/29/2024   CREATININE 0.90 01/29/2024   CALCIUM 7.8 (L) 01/29/2024   PROT 7.1 01/27/2024   ALBUMIN 4.6 01/27/2024   AST 31 01/27/2024   ALT 59 (H) 01/27/2024   ALKPHOS 142 (H) 01/27/2024   BILITOT 1.1 01/27/2024   GFRNONAA >60 01/29/2024   GFRAA >60 01/09/2018    Lab Results  Component Value Date   CEA1 0.8 01/28/2024      Assessment/Plan:  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/02/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-cell clonality positive Persistent raised 2-3 cm central parietal scalp lesion, separate 1 cm posterior parietal scalp lesion, and possible less than 1 cm left posterior parietal scalp lesion on exam 05/06/2023 PET 05/16/2023: 14 mm soft tissue lesion at the left anterior vertex, no evidence of metastatic disease Radiation to the scalp, 30.6 Gray in 17 fractions 06/02/2023 - 06/26/2023 Vitamin B12 deficiency GI bleed in 2017 Hyperlipidemia 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  He developed a small bowel obstruction and was diagnosed with carcinoid tumors of the ileum earlier this month.  An adjacent nodal mesenteric metastasis was resected at the time of the small bowel resection.   He appears to have undergone complete resection of the measurable disease.  We reviewed the 01/27/2024 admission CT findings and images.  We discussed the natural history of carcinoid tumors and treatment options.  There is no indication for adjuvant treatment.  He will undergo additional staging over the next several weeks to include a chromogranin a level and dotatate PET.  Kent Montgomery will return for an office visit and chromogranin A in 4 months.  Arley Hof, MD  02/16/2024  2:55 PM

## 2024-02-17 ENCOUNTER — Encounter: Payer: Self-pay | Admitting: Internal Medicine

## 2024-02-17 ENCOUNTER — Inpatient Hospital Stay

## 2024-02-17 ENCOUNTER — Encounter: Payer: Self-pay | Admitting: *Deleted

## 2024-02-17 DIAGNOSIS — E538 Deficiency of other specified B group vitamins: Secondary | ICD-10-CM | POA: Diagnosis not present

## 2024-02-17 DIAGNOSIS — E785 Hyperlipidemia, unspecified: Secondary | ICD-10-CM | POA: Diagnosis not present

## 2024-02-17 DIAGNOSIS — C7A098 Malignant carcinoid tumors of other sites: Secondary | ICD-10-CM

## 2024-02-17 DIAGNOSIS — Z923 Personal history of irradiation: Secondary | ICD-10-CM | POA: Diagnosis not present

## 2024-02-17 DIAGNOSIS — C7A012 Malignant carcinoid tumor of the ileum: Secondary | ICD-10-CM | POA: Diagnosis not present

## 2024-02-17 DIAGNOSIS — C851A Unspecified b-cell lymphoma, in remission: Secondary | ICD-10-CM | POA: Diagnosis not present

## 2024-02-17 NOTE — Patient Instructions (Addendum)
 Tetanus vaccine givan   Blood work was ordered.       Medications changes include :   None     Return in about 1 year (around 02/17/2025) for Physical Exam.     Health Maintenance, Male Adopting a healthy lifestyle and getting preventive care are important in promoting health and wellness. Ask your health care provider about: The right schedule for you to have regular tests and exams. Things you can do on your own to prevent diseases and keep yourself healthy. What should I know about diet, weight, and exercise? Eat a healthy diet  Eat a diet that includes plenty of vegetables, fruits, low-fat dairy products, and lean protein. Do not eat a lot of foods that are high in solid fats, added sugars, or sodium. Maintain a healthy weight Body mass index (BMI) is a measurement that can be used to identify possible weight problems. It estimates body fat based on height and weight. Your health care provider can help determine your BMI and help you achieve or maintain a healthy weight. Get regular exercise Get regular exercise. This is one of the most important things you can do for your health. Most adults should: Exercise for at least 150 minutes each week. The exercise should increase your heart rate and make you sweat (moderate-intensity exercise). Do strengthening exercises at least twice a week. This is in addition to the moderate-intensity exercise. Spend less time sitting. Even light physical activity can be beneficial. Watch cholesterol and blood lipids Have your blood tested for lipids and cholesterol at 67 years of age, then have this test every 5 years. You may need to have your cholesterol levels checked more often if: Your lipid or cholesterol levels are high. You are older than 67 years of age. You are at high risk for heart disease. What should I know about cancer screening? Many types of cancers can be detected early and may often be prevented. Depending on your  health history and family history, you may need to have cancer screening at various ages. This may include screening for: Colorectal cancer. Prostate cancer. Skin cancer. Lung cancer. What should I know about heart disease, diabetes, and high blood pressure? Blood pressure and heart disease High blood pressure causes heart disease and increases the risk of stroke. This is more likely to develop in people who have high blood pressure readings or are overweight. Talk with your health care provider about your target blood pressure readings. Have your blood pressure checked: Every 3-5 years if you are 62-83 years of age. Every year if you are 24 years old or older. If you are between the ages of 62 and 47 and are a current or former smoker, ask your health care provider if you should have a one-time screening for abdominal aortic aneurysm (AAA). Diabetes Have regular diabetes screenings. This checks your fasting blood sugar level. Have the screening done: Once every three years after age 21 if you are at a normal weight and have a low risk for diabetes. More often and at a younger age if you are overweight or have a high risk for diabetes. What should I know about preventing infection? Hepatitis B If you have a higher risk for hepatitis B, you should be screened for this virus. Talk with your health care provider to find out if you are at risk for hepatitis B infection. Hepatitis C Blood testing is recommended for: Everyone born from [birth year] through 1964. Anyone with known risk factors  for hepatitis C. Sexually transmitted infections (STIs) You should be screened each year for STIs, including gonorrhea and chlamydia, if: You are sexually active and are younger than 67 years of age. You are older than 67 years of age and your health care provider tells you that you are at risk for this type of infection. Your sexual activity has changed since you were last screened, and you are at increased risk  for chlamydia or gonorrhea. Ask your health care provider if you are at risk. Ask your health care provider about whether you are at high risk for HIV. Your health care provider may recommend a prescription medicine to help prevent HIV infection. If you choose to take medicine to prevent HIV, you should first get tested for HIV. You should then be tested every 3 months for as long as you are taking the medicine. Follow these instructions at home: Alcohol  use Do not drink alcohol  if your health care provider tells you not to drink. If you drink alcohol : Limit how much you have to 0-2 drinks a day. Know how much alcohol  is in your drink. In the [place] one drink equals one 12 oz bottle of beer (355 mL), one 5 oz glass of wine (148 mL), or one 1 oz glass of hard liquor (44 mL). Lifestyle Do not use any products that contain nicotine or tobacco. These products include cigarettes, chewing tobacco, and vaping devices, such as e-cigarettes. If you need help quitting, ask your health care provider. Do not use street drugs. Do not share needles. Ask your health care provider for help if you need support or information about quitting drugs. General instructions Schedule regular health, dental, and eye exams. Stay current with your vaccines. Tell your health care provider if: You often feel depressed. You have ever been abused or do not feel safe at home. Summary Adopting a healthy lifestyle and getting preventive care are important in promoting health and wellness. Follow your health care provider's instructions about healthy diet, exercising, and getting tested or screened for diseases. Follow your health care provider's instructions on monitoring your cholesterol and blood pressure. This information is not intended to replace advice given to you by your health care provider. Make sure you discuss any questions you have with your health care provider. Document Revised: 10/30/2020 Document Reviewed:  10/30/2020 Elsevier Patient Education  2024 [employer].

## 2024-02-17 NOTE — Progress Notes (Unsigned)
    Subjective:    Patient ID: Kent Montgomery, male    DOB: Jan 25, 1956, 68 y.o.   MRN: [id number]     HPI Chevez is here for a physical exam and his chronic medical problems.  This year  - dx cutaneous T cell lymphoma of scalp.  Completed radiation of scalp.  In remission  10/2023 - Ab pain - ED.    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  Current Outpatient Medications on File Prior to Visit  Medication Sig Dispense Refill   cyanocobalamin  (VITAMIN B12) 1000 MCG tablet Take 1,000 mcg by mouth daily.     ibuprofen (ADVIL) 200 MG tablet Take 200 mg by mouth every 6 (six) hours as needed for mild pain (pain score 1-3) or moderate pain (pain score 4-6).     PSYLLIUM HUSK PO Take 3 capsules by mouth 2 (two) times daily.     No current facility-administered medications on file prior to visit.    Review of Systems     Objective:  There were no vitals filed for this visit. There were no vitals filed for this visit. There is no height or weight on file to calculate BMI.  BP Readings from Last 3 Encounters:  02/16/24 117/81  02/02/24 107/63  11/20/23 120/80    Wt Readings from Last 3 Encounters:  02/16/24 208 lb 6.4 oz (94.5 kg)  01/28/24 213 lb 13.5 oz (97 kg)  11/20/23 229 lb 12.8 oz (104.2 kg)      Physical Exam Constitutional: He appears well-developed and well-nourished. No distress.  HENT:  Head: Normocephalic and atraumatic.  Right Ear: External ear normal.  Left Ear: External ear normal.  Normal ear canals and TM b/l  Mouth/Throat: Oropharynx is clear and moist. Eyes: Conjunctivae and EOM are normal.  Neck: Neck supple. No tracheal deviation present. No  thyromegaly present.  No carotid bruit  Cardiovascular: Normal rate, regular rhythm, normal heart sounds and intact distal pulses.   No murmur heard.  No lower extremity edema. Pulmonary/Chest: Effort normal and breath sounds normal. No respiratory distress. He has no wheezes. He has no rales.  Abdominal: Soft. He exhibits no distension. There is no tenderness.  Genitourinary: deferred  Lymphadenopathy:   He has no cervical adenopathy.  Skin: Skin is warm and dry. He is not diaphoretic.  Psychiatric: He has a normal mood and affect. His behavior is normal.         Assessment & Plan:   Physical exam: Screening blood work  ordered Exercise    Weight   Substance abuse   none   Reviewed recommended immunizations.   Health Maintenance  Topic Date Due   DTaP/Tdap/Td (1 - Tdap) Never done   COVID-19 Vaccine (4 - 2024-25 season) 04/20/2023   INFLUENZA VACCINE  02/23/2024 (Originally 01/23/2024)   Colonoscopy  01/12/2026   Pneumococcal Vaccine: 50+ Years  Completed   Hepatitis C Screening  Completed   HPV VACCINES  Aged Out   Meningococcal B Vaccine  Aged Out   Zoster Vaccines- Shingrix  Discontinued     See Problem List for Assessment and Plan of chronic medical problems.

## 2024-02-18 ENCOUNTER — Ambulatory Visit: Admitting: Internal Medicine

## 2024-02-18 ENCOUNTER — Encounter: Payer: Self-pay | Admitting: Internal Medicine

## 2024-02-18 VITALS — BP 110/70 | HR 87 | Temp 98.4°F | Ht 70.0 in | Wt 207.0 lb

## 2024-02-18 DIAGNOSIS — Z23 Encounter for immunization: Secondary | ICD-10-CM

## 2024-02-18 DIAGNOSIS — C7A012 Malignant carcinoid tumor of the ileum: Secondary | ICD-10-CM | POA: Diagnosis not present

## 2024-02-18 DIAGNOSIS — E538 Deficiency of other specified B group vitamins: Secondary | ICD-10-CM | POA: Diagnosis not present

## 2024-02-18 DIAGNOSIS — Z0001 Encounter for general adult medical examination with abnormal findings: Secondary | ICD-10-CM

## 2024-02-18 DIAGNOSIS — R739 Hyperglycemia, unspecified: Secondary | ICD-10-CM

## 2024-02-18 DIAGNOSIS — Z125 Encounter for screening for malignant neoplasm of prostate: Secondary | ICD-10-CM

## 2024-02-18 DIAGNOSIS — Z0000 Encounter for general adult medical examination without abnormal findings: Secondary | ICD-10-CM

## 2024-02-18 DIAGNOSIS — E7849 Other hyperlipidemia: Secondary | ICD-10-CM | POA: Diagnosis not present

## 2024-02-18 DIAGNOSIS — R931 Abnormal findings on diagnostic imaging of heart and coronary circulation: Secondary | ICD-10-CM | POA: Diagnosis not present

## 2024-02-18 NOTE — Assessment & Plan Note (Addendum)
 Chronic Lab Results  Component Value Date   HGBA1C 5.7 01/23/2023   Check A1c He has improved his eating and has lost weight since last year Low sugar / carb diet Continue regular exercise

## 2024-02-18 NOTE — Assessment & Plan Note (Addendum)
 Chronic Has tried multiple statins -cholesterol improved, but liver tests became elevated and he had significant joint pain CT coronary artery scan with score of 29 Continue lifestyle control Check lipid panel, TSH EKG today

## 2024-02-18 NOTE — Assessment & Plan Note (Signed)
Chronic Taking supplementation Check B12 level

## 2024-02-18 NOTE — Assessment & Plan Note (Addendum)
 Chronic Very mild CAD, nonobstructive Asymptomatic Has not tolerated statins in the past-lipids controlled with lifestyle Continue regular exercise, healthy diet He has lost weight which will be beneficial Check lipid panel, TSH EKG today

## 2024-02-18 NOTE — Addendum Note (Signed)
 Addended by: CLAUDENE TOBIAS PARAS on: 02/18/2024 01:09 PM   Modules accepted: Orders

## 2024-02-18 NOTE — Assessment & Plan Note (Addendum)
 Recent diagnosis S/p surgery, 1 lymph node was positive out of 25 Following with Dr. Cloretta -no need for adjuvant treatment To have chromogranin a level checked and dotatate PET scan Recovering well from surgery

## 2024-02-19 ENCOUNTER — Ambulatory Visit: Payer: Self-pay | Admitting: Oncology

## 2024-02-19 ENCOUNTER — Ambulatory Visit: Payer: Self-pay | Admitting: Internal Medicine

## 2024-02-19 ENCOUNTER — Other Ambulatory Visit (INDEPENDENT_AMBULATORY_CARE_PROVIDER_SITE_OTHER)

## 2024-02-19 DIAGNOSIS — E7849 Other hyperlipidemia: Secondary | ICD-10-CM | POA: Diagnosis not present

## 2024-02-19 DIAGNOSIS — E538 Deficiency of other specified B group vitamins: Secondary | ICD-10-CM

## 2024-02-19 DIAGNOSIS — R7989 Other specified abnormal findings of blood chemistry: Secondary | ICD-10-CM

## 2024-02-19 DIAGNOSIS — R739 Hyperglycemia, unspecified: Secondary | ICD-10-CM | POA: Diagnosis not present

## 2024-02-19 DIAGNOSIS — Z125 Encounter for screening for malignant neoplasm of prostate: Secondary | ICD-10-CM

## 2024-02-19 LAB — CHROMOGRANIN A: Chromogranin A (ng/mL): 69 ng/mL (ref 0.0–101.8)

## 2024-02-19 LAB — COMPREHENSIVE METABOLIC PANEL WITH GFR
ALT: 63 U/L — ABNORMAL HIGH (ref 0–53)
AST: 45 U/L — ABNORMAL HIGH (ref 0–37)
Albumin: 3.9 g/dL (ref 3.5–5.2)
Alkaline Phosphatase: 211 U/L — ABNORMAL HIGH (ref 39–117)
BUN: 14 mg/dL (ref 6–23)
CO2: 24 meq/L (ref 19–32)
Calcium: 8.2 mg/dL — ABNORMAL LOW (ref 8.4–10.5)
Chloride: 105 meq/L (ref 96–112)
Creatinine, Ser: 1.07 mg/dL (ref 0.40–1.50)
GFR: 72.03 mL/min (ref >60.00)
Glucose, Bld: 102 mg/dL — ABNORMAL HIGH (ref 70–99)
Potassium: 4.3 meq/L (ref 3.5–5.1)
Sodium: 140 meq/L (ref 135–145)
Total Bilirubin: 0.6 mg/dL (ref 0.2–1.2)
Total Protein: 6.4 g/dL (ref 6.0–8.3)

## 2024-02-19 LAB — TSH: TSH: 0.25 u[IU]/mL — ABNORMAL LOW (ref 0.35–5.50)

## 2024-02-19 LAB — HEMOGLOBIN A1C: Hgb A1c MFr Bld: 6.3 % (ref 4.6–6.5)

## 2024-02-19 LAB — PSA, MEDICARE: PSA: 1.57 ng/mL (ref 0.10–4.00)

## 2024-02-19 LAB — LIPID PANEL
Cholesterol: 240 mg/dL — ABNORMAL HIGH (ref 0–200)
HDL: 60 mg/dL (ref >39.00)
LDL Cholesterol: 166 mg/dL — ABNORMAL HIGH (ref 0–99)
NonHDL: 180.39
Total CHOL/HDL Ratio: 4
Triglycerides: 73 mg/dL (ref 0.0–149.0)
VLDL: 14.6 mg/dL (ref 0.0–40.0)

## 2024-02-19 LAB — VITAMIN B12: Vitamin B-12: 507 pg/mL (ref 211–911)

## 2024-03-12 ENCOUNTER — Other Ambulatory Visit

## 2024-03-12 DIAGNOSIS — R7989 Other specified abnormal findings of blood chemistry: Secondary | ICD-10-CM

## 2024-03-12 LAB — COMPREHENSIVE METABOLIC PANEL WITH GFR
ALT: 85 U/L — ABNORMAL HIGH (ref 0–53)
AST: 37 U/L (ref 0–37)
Albumin: 4.4 g/dL (ref 3.5–5.2)
Alkaline Phosphatase: 179 U/L — ABNORMAL HIGH (ref 39–117)
BUN: 21 mg/dL (ref 6–23)
CO2: 25 meq/L (ref 19–32)
Calcium: 8.8 mg/dL (ref 8.4–10.5)
Chloride: 103 meq/L (ref 96–112)
Creatinine, Ser: 1.18 mg/dL (ref 0.40–1.50)
GFR: 64.03 mL/min (ref >60.00)
Glucose, Bld: 107 mg/dL — ABNORMAL HIGH (ref 70–99)
Potassium: 4.1 meq/L (ref 3.5–5.1)
Sodium: 136 meq/L (ref 135–145)
Total Bilirubin: 0.8 mg/dL (ref 0.2–1.2)
Total Protein: 7.2 g/dL (ref 6.0–8.3)

## 2024-03-12 LAB — VITAMIN D 25 HYDROXY (VIT D DEFICIENCY, FRACTURES): VITD: 29.7 ng/mL — ABNORMAL LOW (ref 30.00–100.00)

## 2024-03-12 LAB — TSH: TSH: 0.55 u[IU]/mL (ref 0.35–5.50)

## 2024-03-13 ENCOUNTER — Ambulatory Visit: Payer: Self-pay | Admitting: Internal Medicine

## 2024-03-13 DIAGNOSIS — R748 Abnormal levels of other serum enzymes: Secondary | ICD-10-CM

## 2024-03-15 ENCOUNTER — Ambulatory Visit: Admitting: Oncology

## 2024-03-15 ENCOUNTER — Encounter (HOSPITAL_COMMUNITY)
Admission: RE | Admit: 2024-03-15 | Discharge: 2024-03-15 | Disposition: A | Discharge status: Home / Self Care | Source: Ambulatory Visit | Attending: Oncology | Admitting: Oncology

## 2024-03-15 DIAGNOSIS — C7A098 Malignant carcinoid tumors of other sites: Secondary | ICD-10-CM | POA: Insufficient documentation

## 2024-03-15 DIAGNOSIS — C786 Secondary malignant neoplasm of retroperitoneum and peritoneum: Secondary | ICD-10-CM | POA: Diagnosis not present

## 2024-03-15 DIAGNOSIS — C7A8 Other malignant neuroendocrine tumors: Secondary | ICD-10-CM | POA: Diagnosis not present

## 2024-03-15 MED ORDER — COPPER CU 64 DOTATATE 1 MCI/ML IV SOLN
4.0000 | Freq: Once | INTRAVENOUS | Status: AC
  Administered 2024-03-15: 4.152 via INTRAVENOUS

## 2024-03-19 ENCOUNTER — Telehealth: Payer: Self-pay | Admitting: Oncology

## 2024-03-19 NOTE — Telephone Encounter (Signed)
 Notified Mr. Gallo that PET is negative for metastatic disease other than an are of metabolism in rt. Iliac of uncertain significance, no bone lesion seen on CT. Can review at next appt., call  for iliac pain. Case is being added to next GI conference, radiology only. He will be called with recommendations after that. Notified nurse navigator to add on.

## 2024-03-23 ENCOUNTER — Ambulatory Visit: Admitting: Physician Assistant

## 2024-03-24 ENCOUNTER — Other Ambulatory Visit: Payer: Self-pay

## 2024-03-24 NOTE — Progress Notes (Signed)
 The proposed treatment discussed in conference is for discussion purpose only and is not a binding recommendation.  The patients have not been physically examined, or presented with their treatment options.  Therefore, final treatment plans cannot be decided.

## 2024-04-01 DIAGNOSIS — D1801 Hemangioma of skin and subcutaneous tissue: Secondary | ICD-10-CM | POA: Diagnosis not present

## 2024-04-01 DIAGNOSIS — Z85828 Personal history of other malignant neoplasm of skin: Secondary | ICD-10-CM | POA: Diagnosis not present

## 2024-04-01 DIAGNOSIS — D225 Melanocytic nevi of trunk: Secondary | ICD-10-CM | POA: Diagnosis not present

## 2024-04-01 DIAGNOSIS — L723 Sebaceous cyst: Secondary | ICD-10-CM | POA: Diagnosis not present

## 2024-04-01 DIAGNOSIS — L905 Scar conditions and fibrosis of skin: Secondary | ICD-10-CM | POA: Diagnosis not present

## 2024-04-01 DIAGNOSIS — L821 Other seborrheic keratosis: Secondary | ICD-10-CM | POA: Diagnosis not present

## 2024-04-02 ENCOUNTER — Ambulatory Visit: Payer: Self-pay | Admitting: Internal Medicine

## 2024-04-02 ENCOUNTER — Other Ambulatory Visit (INDEPENDENT_AMBULATORY_CARE_PROVIDER_SITE_OTHER)

## 2024-04-02 DIAGNOSIS — R748 Abnormal levels of other serum enzymes: Secondary | ICD-10-CM | POA: Diagnosis not present

## 2024-04-02 LAB — HEPATIC FUNCTION PANEL
ALT: 121 U/L — ABNORMAL HIGH (ref 0–53)
AST: 46 U/L — ABNORMAL HIGH (ref 0–37)
Albumin: 4.1 g/dL (ref 3.5–5.2)
Alkaline Phosphatase: 257 U/L — ABNORMAL HIGH (ref 39–117)
Bilirubin, Direct: 0.2 mg/dL (ref 0.0–0.3)
Total Bilirubin: 0.7 mg/dL (ref 0.2–1.2)
Total Protein: 7.1 g/dL (ref 6.0–8.3)

## 2024-04-04 ENCOUNTER — Other Ambulatory Visit: Payer: Self-pay | Admitting: Internal Medicine

## 2024-04-04 ENCOUNTER — Encounter: Payer: Self-pay | Admitting: Internal Medicine

## 2024-04-04 DIAGNOSIS — R7989 Other specified abnormal findings of blood chemistry: Secondary | ICD-10-CM

## 2024-04-22 ENCOUNTER — Inpatient Hospital Stay: Attending: Oncology | Admitting: Oncology

## 2024-04-22 VITALS — BP 119/78 | HR 84 | Temp 98.3°F | Resp 18 | Ht 70.0 in | Wt 215.5 lb

## 2024-04-22 DIAGNOSIS — E538 Deficiency of other specified B group vitamins: Secondary | ICD-10-CM | POA: Insufficient documentation

## 2024-04-22 DIAGNOSIS — C7A012 Malignant carcinoid tumor of the ileum: Secondary | ICD-10-CM | POA: Diagnosis not present

## 2024-04-22 DIAGNOSIS — C851A Unspecified b-cell lymphoma, in remission: Secondary | ICD-10-CM | POA: Insufficient documentation

## 2024-04-22 DIAGNOSIS — C7A098 Malignant carcinoid tumors of other sites: Secondary | ICD-10-CM

## 2024-04-22 DIAGNOSIS — E785 Hyperlipidemia, unspecified: Secondary | ICD-10-CM | POA: Diagnosis not present

## 2024-04-22 DIAGNOSIS — Z923 Personal history of irradiation: Secondary | ICD-10-CM | POA: Diagnosis not present

## 2024-04-22 NOTE — Progress Notes (Signed)
 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  He underwent a dotatate PET on 03/15/2024.  There is a focus of hypermetabolism in the right iliac bone with no corresponding lesion on CT.  No other evidence of metastatic disease.  He feels well.  No fever, night sweats, flushing, or diarrhea.  He has discomfort at the right lateral abdomen when lying on the right side.  No pain at the right iliac.  No new skin lesion.  He has been evaluated by dermatology within the past month. The liver enzymes have been elevated on chemistry panels over the past 2 months.  He is scheduled for follow-up with Dr. Geofm. Objective:  Vital signs in last 24 hours:  Blood pressure 119/78, pulse 84, temperature 98.3 F (36.8 C), temperature source Temporal, resp. rate 18, height 5' 10 (1.778 m), weight 215 lb 8 oz (97.8 kg), SpO2 100%.   Lymphatics: No cervical, supraclavicular, axillary, or inguinal nodes Resp: Lungs clear bilaterally Cardio: Regular rate and rhythm GI: No hepatosplenomegaly, nontender, no mass Vascular: No leg edema  Skin: No lesion at the parietal scalp Musculoskeletal: No tenderness at the right iliac  Lab Results:  Lab Results  Component Value Date   WBC 9.7 01/29/2024   HGB 13.3 01/29/2024   HCT 40.0 01/29/2024   MCV 84.6 01/29/2024   PLT 176 01/29/2024   NEUTROABS 7.0 11/16/2023    CMP  Lab Results  Component Value Date   NA 136 03/12/2024   K 4.1 03/12/2024   CL 103 03/12/2024   CO2 25 03/12/2024   GLUCOSE 107 (H) 03/12/2024   BUN 21 03/12/2024   CREATININE 1.18 03/12/2024   CALCIUM 8.8 03/12/2024   PROT 7.1 04/02/2024   ALBUMIN 4.1 04/02/2024   AST 46 (H) 04/02/2024   ALT 121 (H) 04/02/2024   ALKPHOS 257 (H) 04/02/2024   BILITOT 0.7 04/02/2024   GFRNONAA >60 01/29/2024   GFRAA >60 01/09/2018    Lab Results  Component Value Date    CEA1 0.8 01/28/2024     Medications: I have reviewed the patient's current medications.   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), 2 ileum lesions measuring 2.5 x 2.2 x 0.9 cm and 1 x 1 x 0.3 cm Negative resection margin, Ki-67 less than 1%, 1/25 nodes 01/27/2024 CT abdomen/pelvis: Small bowel dilation to the level of an obstructing enhancing small bowel mass, adjacent small bowel mesenteric enhancing lesion and smaller surrounding mesenteric lymph nodes 01/28/2024: Exploratory laparotomy, segmental small bowel resection, resection of mesenteric mass 02/17/2024: Normal chromogranin A 03/15/2024 dotatate PET: Single focus of radiotracer activity the right iliac bone without a CT correlate, no other evidence of residual/metastatic neuroendocrine tumor       Disposition: 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  The staging dotatate PET 03/15/2024 revealed a single focus of tracer activity at the right iliac bone without a  CT correlate.  No other evidence of residual or metastatic carcinoid tumor.  I reviewed the PET and CT images with him today.  We discussed the differential diagnosis of the iliac lesion including a carcinoid lesion, lymphoma, and a benign process.  He would like to proceed with a diagnostic MRI.  He will return for a chromogranin a in December.  He will return for an office and lab visit in  April.  He will call for new symptoms in the interim.  He continues follow-up with dermatology for surveillance of the skin lymphoma.  The liver enzymes are mildly elevated.  This is of unclear etiology.  I think it is unlikely the liver enzyme normalities are related to lymphoma or the carcinoid tumor.  He plans to have a repeat liver panel with Dr. Geofm today.  He will see Dr. Albertus if the liver enzymes remain elevated.  He reports mild alcohol  use.  Arley Hof, MD  04/22/2024  8:11 AM

## 2024-04-23 ENCOUNTER — Other Ambulatory Visit (INDEPENDENT_AMBULATORY_CARE_PROVIDER_SITE_OTHER)

## 2024-04-23 DIAGNOSIS — R7989 Other specified abnormal findings of blood chemistry: Secondary | ICD-10-CM | POA: Diagnosis not present

## 2024-04-23 LAB — HEPATIC FUNCTION PANEL
ALT: 54 U/L — ABNORMAL HIGH (ref 0–53)
AST: 35 U/L (ref 0–37)
Albumin: 4.3 g/dL (ref 3.5–5.2)
Alkaline Phosphatase: 165 U/L — ABNORMAL HIGH (ref 39–117)
Bilirubin, Direct: 0.2 mg/dL (ref 0.0–0.3)
Total Bilirubin: 0.9 mg/dL (ref 0.2–1.2)
Total Protein: 7.1 g/dL (ref 6.0–8.3)

## 2024-04-25 ENCOUNTER — Ambulatory Visit: Payer: Self-pay | Admitting: Internal Medicine

## 2024-05-03 ENCOUNTER — Ambulatory Visit (HOSPITAL_COMMUNITY)
Admission: RE | Admit: 2024-05-03 | Discharge: 2024-05-03 | Disposition: A | Discharge status: Home / Self Care | Source: Ambulatory Visit | Attending: Oncology | Admitting: Oncology

## 2024-05-03 DIAGNOSIS — C7A098 Malignant carcinoid tumors of other sites: Secondary | ICD-10-CM | POA: Diagnosis present

## 2024-05-03 DIAGNOSIS — K4090 Unilateral inguinal hernia, without obstruction or gangrene, not specified as recurrent: Secondary | ICD-10-CM | POA: Diagnosis not present

## 2024-05-03 MED ORDER — GADOBUTROL 1 MMOL/ML IV SOLN
9.0000 mL | Freq: Once | INTRAVENOUS | Status: AC | PRN
  Administered 2024-05-03: 9 mL via INTRAVENOUS

## 2024-05-06 ENCOUNTER — Encounter: Payer: Self-pay | Admitting: *Deleted

## 2024-05-06 ENCOUNTER — Other Ambulatory Visit (HOSPITAL_COMMUNITY): Payer: Self-pay

## 2024-05-06 NOTE — Progress Notes (Unsigned)
 Referral faxed to [facility] Dr Ozell Seat at [phone]

## 2024-05-13 DIAGNOSIS — D3A8 Other benign neuroendocrine tumors: Secondary | ICD-10-CM | POA: Diagnosis not present

## 2024-05-13 DIAGNOSIS — C786 Secondary malignant neoplasm of retroperitoneum and peritoneum: Secondary | ICD-10-CM | POA: Diagnosis not present

## 2024-05-13 DIAGNOSIS — C7A019 Malignant carcinoid tumor of the small intestine, unspecified portion: Secondary | ICD-10-CM | POA: Diagnosis not present

## 2024-05-19 DIAGNOSIS — C7A8 Other malignant neuroendocrine tumors: Secondary | ICD-10-CM | POA: Diagnosis not present

## 2024-06-14 ENCOUNTER — Ambulatory Visit: Admitting: Oncology

## 2024-06-14 ENCOUNTER — Inpatient Hospital Stay: Attending: Oncology

## 2024-06-14 DIAGNOSIS — C851A Unspecified b-cell lymphoma, in remission: Secondary | ICD-10-CM | POA: Diagnosis present

## 2024-06-14 DIAGNOSIS — C7A019 Malignant carcinoid tumor of the small intestine, unspecified portion: Secondary | ICD-10-CM | POA: Diagnosis not present

## 2024-06-14 DIAGNOSIS — C7A098 Malignant carcinoid tumors of other sites: Secondary | ICD-10-CM

## 2024-06-15 LAB — CHROMOGRANIN A: Chromogranin A (ng/mL): 83.6 ng/mL (ref 0.0–101.8)

## 2024-10-04 ENCOUNTER — Inpatient Hospital Stay

## 2024-10-04 ENCOUNTER — Inpatient Hospital Stay: Admitting: Oncology
